# Patient Record
Sex: Male | Born: 1950 | Race: White | Hispanic: No | Marital: Married | Smoking: Former smoker
Health system: Southern US, Community
[De-identification: ages and names within clinical notes are randomized; demographics above are authoritative.]

## PROBLEM LIST (undated history)

## (undated) DIAGNOSIS — E7800 Pure hypercholesterolemia, unspecified: Secondary | ICD-10-CM

## (undated) DIAGNOSIS — E039 Hypothyroidism, unspecified: Secondary | ICD-10-CM

## (undated) DIAGNOSIS — M1990 Unspecified osteoarthritis, unspecified site: Secondary | ICD-10-CM

## (undated) DIAGNOSIS — F32A Depression, unspecified: Secondary | ICD-10-CM

## (undated) DIAGNOSIS — IMO0002 Reserved for concepts with insufficient information to code with codable children: Secondary | ICD-10-CM

## (undated) DIAGNOSIS — I10 Essential (primary) hypertension: Secondary | ICD-10-CM

## (undated) DIAGNOSIS — F329 Major depressive disorder, single episode, unspecified: Secondary | ICD-10-CM

## (undated) DIAGNOSIS — L0291 Cutaneous abscess, unspecified: Secondary | ICD-10-CM

## (undated) DIAGNOSIS — I639 Cerebral infarction, unspecified: Secondary | ICD-10-CM

## (undated) DIAGNOSIS — Z86010 Personal history of colonic polyps: Secondary | ICD-10-CM

## (undated) HISTORY — PX: COLONOSCOPY: SHX174

## (undated) HISTORY — DX: Personal history of colonic polyps: Z86.010

## (undated) HISTORY — PX: APPENDECTOMY: SHX54

## (undated) HISTORY — PX: OTHER SURGICAL HISTORY: SHX169

## (undated) HISTORY — DX: Hypothyroidism, unspecified: E03.9

---

## 1997-12-18 ENCOUNTER — Ambulatory Visit (HOSPITAL_COMMUNITY): Admission: RE | Admit: 1997-12-18 | Discharge: 1997-12-18 | Payer: Self-pay | Admitting: Family Medicine

## 2002-12-10 ENCOUNTER — Encounter: Payer: Self-pay | Admitting: Family Medicine

## 2002-12-10 ENCOUNTER — Ambulatory Visit (HOSPITAL_COMMUNITY): Admission: RE | Admit: 2002-12-10 | Discharge: 2002-12-10 | Payer: Self-pay | Admitting: Family Medicine

## 2003-08-26 ENCOUNTER — Inpatient Hospital Stay (HOSPITAL_COMMUNITY): Admission: EM | Admit: 2003-08-26 | Discharge: 2003-08-28 | Payer: Self-pay | Admitting: Emergency Medicine

## 2003-08-31 ENCOUNTER — Encounter: Admission: RE | Admit: 2003-08-31 | Discharge: 2003-11-29 | Payer: Self-pay | Admitting: Neurology

## 2004-08-18 ENCOUNTER — Ambulatory Visit: Payer: Self-pay | Admitting: Internal Medicine

## 2004-09-20 ENCOUNTER — Ambulatory Visit: Payer: Self-pay | Admitting: Internal Medicine

## 2004-10-05 ENCOUNTER — Ambulatory Visit: Payer: Self-pay | Admitting: Internal Medicine

## 2012-10-06 ENCOUNTER — Encounter (HOSPITAL_COMMUNITY): Payer: Self-pay | Admitting: *Deleted

## 2012-10-06 ENCOUNTER — Inpatient Hospital Stay (HOSPITAL_COMMUNITY)
Admission: EM | Admit: 2012-10-06 | Discharge: 2012-10-09 | DRG: 603 | Disposition: A | Discharge status: Home / Self Care | Payer: Non-veteran care | Attending: Internal Medicine | Admitting: Internal Medicine

## 2012-10-06 DIAGNOSIS — F3289 Other specified depressive episodes: Secondary | ICD-10-CM | POA: Diagnosis present

## 2012-10-06 DIAGNOSIS — Z8673 Personal history of transient ischemic attack (TIA), and cerebral infarction without residual deficits: Secondary | ICD-10-CM

## 2012-10-06 DIAGNOSIS — M7040 Prepatellar bursitis, unspecified knee: Secondary | ICD-10-CM | POA: Diagnosis present

## 2012-10-06 DIAGNOSIS — L02415 Cutaneous abscess of right lower limb: Secondary | ICD-10-CM

## 2012-10-06 DIAGNOSIS — L02419 Cutaneous abscess of limb, unspecified: Principal | ICD-10-CM | POA: Diagnosis present

## 2012-10-06 DIAGNOSIS — Z87891 Personal history of nicotine dependence: Secondary | ICD-10-CM

## 2012-10-06 DIAGNOSIS — E785 Hyperlipidemia, unspecified: Secondary | ICD-10-CM

## 2012-10-06 DIAGNOSIS — L03119 Cellulitis of unspecified part of limb: Secondary | ICD-10-CM | POA: Diagnosis present

## 2012-10-06 DIAGNOSIS — F329 Major depressive disorder, single episode, unspecified: Secondary | ICD-10-CM | POA: Diagnosis present

## 2012-10-06 DIAGNOSIS — L03115 Cellulitis of right lower limb: Secondary | ICD-10-CM

## 2012-10-06 DIAGNOSIS — IMO0002 Reserved for concepts with insufficient information to code with codable children: Secondary | ICD-10-CM | POA: Diagnosis present

## 2012-10-06 DIAGNOSIS — I10 Essential (primary) hypertension: Secondary | ICD-10-CM

## 2012-10-06 DIAGNOSIS — A4902 Methicillin resistant Staphylococcus aureus infection, unspecified site: Secondary | ICD-10-CM | POA: Diagnosis present

## 2012-10-06 DIAGNOSIS — E079 Disorder of thyroid, unspecified: Secondary | ICD-10-CM

## 2012-10-06 DIAGNOSIS — E7800 Pure hypercholesterolemia, unspecified: Secondary | ICD-10-CM | POA: Diagnosis present

## 2012-10-06 HISTORY — DX: Unspecified osteoarthritis, unspecified site: M19.90

## 2012-10-06 HISTORY — DX: Cutaneous abscess, unspecified: L02.91

## 2012-10-06 HISTORY — DX: Cerebral infarction, unspecified: I63.9

## 2012-10-06 HISTORY — DX: Essential (primary) hypertension: I10

## 2012-10-06 HISTORY — DX: Pure hypercholesterolemia, unspecified: E78.00

## 2012-10-06 HISTORY — DX: Depression, unspecified: F32.A

## 2012-10-06 HISTORY — DX: Major depressive disorder, single episode, unspecified: F32.9

## 2012-10-06 HISTORY — DX: Reserved for concepts with insufficient information to code with codable children: IMO0002

## 2012-10-06 LAB — CBC
Hemoglobin: 13.8 g/dL (ref 13.0–17.0)
MCH: 31.5 pg (ref 26.0–34.0)
MCHC: 34.7 g/dL (ref 30.0–36.0)
MCV: 90.9 fL (ref 78.0–100.0)
RBC: 4.38 MIL/uL (ref 4.22–5.81)
RDW: 12.8 % (ref 11.5–15.5)

## 2012-10-06 LAB — POCT I-STAT, CHEM 8
BUN: 15 mg/dL (ref 6–23)
Calcium, Ion: 1.18 mmol/L (ref 1.13–1.30)
Chloride: 103 mEq/L (ref 96–112)
Glucose, Bld: 102 mg/dL — ABNORMAL HIGH (ref 70–99)
Hemoglobin: 13.6 g/dL (ref 13.0–17.0)
Potassium: 4 mEq/L (ref 3.5–5.1)
TCO2: 28 mmol/L (ref 0–100)

## 2012-10-06 LAB — CBC WITH DIFFERENTIAL/PLATELET
Eosinophils Absolute: 0.4 10*3/uL (ref 0.0–0.7)
Lymphocytes Relative: 20 % (ref 12–46)
Lymphs Abs: 3.7 10*3/uL (ref 0.7–4.0)
MCHC: 35 g/dL (ref 30.0–36.0)
Monocytes Relative: 11 % (ref 3–12)
Platelets: 295 10*3/uL (ref 150–400)
RDW: 12.9 % (ref 11.5–15.5)

## 2012-10-06 LAB — HEMOGLOBIN A1C: Mean Plasma Glucose: 103 mg/dL (ref <117)

## 2012-10-06 MED ORDER — ENOXAPARIN SODIUM 40 MG/0.4ML ~~LOC~~ SOLN
40.0000 mg | SUBCUTANEOUS | Status: DC
  Administered 2012-10-06 – 2012-10-08 (×3): 40 mg via SUBCUTANEOUS
  Filled 2012-10-06 (×4): qty 0.4

## 2012-10-06 MED ORDER — LIDOCAINE-EPINEPHRINE (PF) 2 %-1:200000 IJ SOLN
10.0000 mL | Freq: Once | INTRAMUSCULAR | Status: DC
  Filled 2012-10-06: qty 10

## 2012-10-06 MED ORDER — VANCOMYCIN HCL IN DEXTROSE 1-5 GM/200ML-% IV SOLN
1000.0000 mg | Freq: Two times a day (BID) | INTRAVENOUS | Status: DC
  Administered 2012-10-06 – 2012-10-08 (×4): 1000 mg via INTRAVENOUS
  Filled 2012-10-06 (×5): qty 200

## 2012-10-06 MED ORDER — ONDANSETRON HCL 4 MG/2ML IJ SOLN: 4.0000 mg | Freq: Four times a day (QID) | INTRAMUSCULAR | Status: DC | PRN

## 2012-10-06 MED ORDER — SENNA 8.6 MG PO TABS
1.0000 | ORAL_TABLET | Freq: Two times a day (BID) | ORAL | Status: DC
  Administered 2012-10-06 – 2012-10-09 (×6): 8.6 mg via ORAL
  Filled 2012-10-06 (×8): qty 1

## 2012-10-06 MED ORDER — VANCOMYCIN HCL IN DEXTROSE 1-5 GM/200ML-% IV SOLN
1000.0000 mg | INTRAVENOUS | Status: DC
  Filled 2012-10-06: qty 200

## 2012-10-06 MED ORDER — CLINDAMYCIN PHOSPHATE 600 MG/50ML IV SOLN
600.0000 mg | Freq: Once | INTRAVENOUS | Status: AC
  Administered 2012-10-06: 600 mg via INTRAVENOUS
  Filled 2012-10-06: qty 50

## 2012-10-06 MED ORDER — MORPHINE SULFATE 4 MG/ML IJ SOLN
4.0000 mg | Freq: Once | INTRAMUSCULAR | Status: AC
  Administered 2012-10-06: 4 mg via INTRAVENOUS
  Filled 2012-10-06: qty 1

## 2012-10-06 MED ORDER — ASPIRIN EC 81 MG PO TBEC
81.0000 mg | DELAYED_RELEASE_TABLET | Freq: Every day | ORAL | Status: DC
  Administered 2012-10-06 – 2012-10-09 (×4): 81 mg via ORAL
  Filled 2012-10-06 (×4): qty 1

## 2012-10-06 MED ORDER — HYDROCODONE-ACETAMINOPHEN 5-325 MG PO TABS
1.0000 | ORAL_TABLET | ORAL | Status: DC | PRN
  Administered 2012-10-06 – 2012-10-09 (×12): 2 via ORAL
  Filled 2012-10-06 (×12): qty 2

## 2012-10-06 MED ORDER — LIDOCAINE-EPINEPHRINE 1 %-1:100000 IJ SOLN
10.0000 mL | Freq: Once | INTRAMUSCULAR | Status: AC
  Administered 2012-10-06: 10 mL via INTRADERMAL
  Filled 2012-10-06: qty 1

## 2012-10-06 MED ORDER — ONDANSETRON HCL 4 MG PO TABS: 4.0000 mg | ORAL_TABLET | Freq: Four times a day (QID) | ORAL | Status: DC | PRN

## 2012-10-06 NOTE — ED Provider Notes (Signed)
Medical screening examination/treatment/procedure(s) were conducted as a shared visit with non-physician practitioner(s) and myself.  I personally evaluated the patient during the encounter  4 days of redness, swelling, erythema to R knee with surrounding erythema.  ROM intact, no evidence of septic joint.  No fever or vomiting. No history of diabetes or tick bite.  Abscess with extensive cellulitis, would benefit from IV antibiotics.  Glynn Octave, MD 10/06/12 1710

## 2012-10-06 NOTE — ED Notes (Signed)
PA at bedside.

## 2012-10-06 NOTE — H&P (Signed)
Date: 10/06/2012               Patient Name:  Randy Shepherd MRN: [id number]  DOB: [birth year] Age / Sex: 62 y.o., male   PCP: No primary provider on file.              Medical Service: Internal Medicine Teaching Service              Attending Physician: Dr. Meredith Pel    First Contact: Kerrie Pleasure, MS 3 Pager: [phone]  Second Contact: Dr. Collier Bullock Pager: [phone]  Third Contact Dr. Dorise Hiss Pager: [phone]       After Hours (After 5p/  First Contact Pager: [phone]  weekends / holidays): Second Contact Pager: [phone]   Chief Complaint: knee pain  History of Present Illness: Randy Shepherd is a 62 yo with PMH of HTN, HLD, degenerative disk disease, and thyroid disease who presents with knee pain and swelling since Thursday. On Thursday he noticed a pimple and "whitehead" on his R knee that was non-pruritic but 6/10 on the pain scale and caused him to walk with a limp. On Friday, the pain worsened so he tried to pick the lesion and drain some of the pus. He also noticed increasing edema so that his knee was twice the size as normal. The pain continued to worsen and was exacerbated with standing and weight bearing. However, he does not feel like the pain is in his knee joint. On Saturday, he tried using a heating pad with minimal relief. He denied taking any pain medications. The pain worsened to 10/10 by Saturday night and he was unable to move his knee this morning so he decided to come to the ED for evaluation.   He reported that on Thursday he also noticed 3 small areas on his L knee that looked like bug bites. He assumed that the lesion on his R knee was also a bug bite. He denied any abrasion or penetrating trauma to his R leg, pre-existing skin infection, recent travel, or surgery to his knee.    He reported that 2 weeks ago he was infected with poison ivy after doing yard work. It affected primarily his R arm and groin but improved in a few days with the use of calamine lotion.  He  denies recent fevers, chills, shortness of breath, abdominal pain, diarrhea, nausea/vomiting, or changes in urination.    In the ED, he had I&D of his R knee and received one dose of clindamycin. On evaluation he reported improvement in his pain but still endorsed discomfort even at rest.   Meds: No current facility-administered medications for this encounter.   No current outpatient prescriptions on file.    Allergies: Allergies as of 10/06/2012  . (No Known Allergies)   Past Medical History  Diagnosis Date  . Hypertension   . Thyroid disease   . High cholesterol   . Depression   . 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 rate 16, SpO2 97.00%. BP 139/87  Pulse 76  Temp(Src) 97.5 F (36.4 C) (Oral)  Resp 16  SpO2 97%  General Appearance: Lying in bed, NAD, appears stated age Head: Normocephalic, atraumatic Eyes: PERRL, Conjunctiva/corneas clear, EOMI Nose: No drainage or sinus tenderness Throat: MMM, Dentures in place Neck: Supple, normal ROM, no thyromegaly or adenopathy appreciated Lungs: CTAB, normal WOB, no wheezes or crackles appreciated Heart: RRR, no murmurs appreciated, no JVD, no carotid bruits Abdomen: Soft, nontender, normal bowel sounds, no masses appreciated Extremities: 2+ PT and DP pulses b/l, no cyanosis, R knee with decreased ROM and flexion up to 45 degrees. Diffuse TTP  over R knee. Skin: No lesions, cracks, or increased dryness in R foot.  Warmth and diffuse erythema over R knee roughly extending to 12 cm above knee and down to mid calf. Hardened area with increased erythema in suprapatellar region measuring 4cm x 6cm with increased tenderness to palpation. Mild fluctuance on medial aspect of R knee. Packing visible from incision site. Borders of erythema marked.  3 well healing lesions 0.5 cm each on medial aspect of L thigh. Neurologic: CNII-XII intact. Normal strength in R knee with flexion, extension, abduction, adduction. Normal strength in R hip with flexion, extension, external rotation, internal rotation, abduction, adduction. Normal sensation in R knee. DTR deferred secondary to pain. Psychiatric: Normal mood and affect  Lab results: Basic Metabolic Panel:  Recent Labs  16/10/96 0834  NA 140  K 4.0  CL 103  GLUCOSE 102*  BUN 15  CREATININE 1.20   CBC:  Recent Labs  10/06/12 0810 10/06/12 0834  WBC 18.7*  --   NEUTROABS 12.5*  --   HGB 14.0 13.6  HCT 40.0 40.0  MCV 90.9  --   PLT 295  --     Assessment & Plan by Problem: Randy Shepherd is a 62 yo with PMH of HTN, HLD, degenerative disk disease, and thyroid disease who presents with knee pain and swelling since Thursday s/p I&D in the ED and currently in stable condition.  1. RLE pain - Etiology likely due to bug bite last week. Differential includes cellulitis (purulent vs. nonpurulent), cutaneous abscess, necrotizing fasciitis, and osteomyelitis. Osteomyelitis less likely as infection does not seem to involve knee joint. ROM with knee flexion likely decreased secondary to pain. Normal ROM otherwise. Will not pursue knee imaging at this time. Necrotizing fasciitis unlikely given onset of symptoms on Thursday. Most likely at this time is cutaneous abscess possibly leading to cellulitis given extensive borders of the lesion. Most likely caused by staph or strep species.     - Will  discontinue clindamycin as there is low suspicion for necrotizing fasciitis and gas gangrene. - Start vancomycin - Will check blood cultures given pt's WBC 18.7. (although doubt validity as pt already received 1 dose of clinda) - Pain control with morphine prn - Repeat CBC in the morning. - Reassess for receding borders in the morning.  2. Disposition - Dispo is deferred at this time, awaiting improvement of RLE infection. - Anticipated discharge in approximately 1-2 day(s). - The patient does not have a current PCP (No primary provider on file.) and does need an [facility] hospital follow-up appointment after discharge.   This is a Psychologist, occupational Note.  The care of the patient was discussed with Dr. Collier Bullock and Dr. Dorise Hiss and the assessment and plan was formulated with their assistance.  Please see their note for official documentation of the patient encounter.   Signed:  Kerrie Pleasure, Med Student 10/06/2012, 10:47 AM

## 2012-10-06 NOTE — ED Notes (Signed)
Reports possible bug bite to right knee, swelling and redness since Thursday.

## 2012-10-06 NOTE — ED Notes (Signed)
Right leg above the knee noted to have obvious erythema, edema, calor, and tenderness. Pt states he noticed a pustule initially (picked at it) site worsened. Erythema extending to upper thigh.

## 2012-10-06 NOTE — ED Provider Notes (Signed)
History     CSN: [id number]  Arrival date & time 10/06/12  0729   First MD Initiated Contact with Patient 10/06/12 [phone]      Chief Complaint  Patient presents with  . Abscess  . Knee Pain    (Consider location/radiation/quality/duration/timing/severity/associated sxs/prior treatment) HPI  62 year old male with history of degenerative disc disease, thyroid disease, and hypertension presents complaining of right knee pain and swelling.patient reports gradual onset of pain, redness, and swelling to the skin surface of her right knee ongoing for the past 4 days. Patient initially noticed a small bump to the anterior aspect of his right knee, which has been progressively worsening with redness and warmth.  He admits to cleaning out in the yard but does not recall any tick bite. Pain is described as an achy throbbing and burning sensation, 10 out of 10, persistent worsening with weightbearing. He does not think the pain is from the knee joint itself. He denies any recent trauma. He denies fever, nausea, vomiting, diarrhea, numbness or weakness. No history of diabetes. No specific treatment tried.  Past Medical History  Diagnosis Date  . Hypertension   . Thyroid disease   . High cholesterol   . Depression   . Degenerative disc disease     History reviewed. No pertinent past surgical history.  History reviewed. No pertinent family history.  History  Substance Use Topics  . Smoking status: Former Games developer  . Smokeless tobacco: Not on file  . Alcohol Use: Yes      Review of Systems  Constitutional: Negative for fever.  Musculoskeletal: Positive for myalgias.  Skin: Positive for rash. Negative for wound.  Neurological: Negative for numbness.    Allergies  Review of patient's allergies indicates no known allergies.  Home Medications  No current outpatient prescriptions on file.  BP 139/87  Pulse 93  Temp(Src) 97.5 F (36.4 C) (Oral)  Resp 18  SpO2 96%  Physical Exam   Nursing note and vitals reviewed. Constitutional: He is oriented to person, place, and time. He appears well-developed and well-nourished. No distress.  HENT:  Head: Atraumatic.  Eyes: Conjunctivae are normal.  Neck: Neck supple.  Neurological: He is alert and oriented to person, place, and time.  Having difficulty ambulating when bearing weight on R leg due to pain.  Skin: Skin is warm. Rash (R knee: a small pustular lesion to suprapatella region with marked surroudng erythema and warmth measuring 2x4inches and diffused erythema throughout surrounding skin.  decreased R knee flexion due to pain.  affected area is indurated with mild fluctuance) noted.  Psychiatric: He has a normal mood and affect.    ED Course  Procedures (including critical care time)  8:14 AM Pt with cellulitic and possible cutaneous abscess to R knee.  Doubt septic joint as it is cutaneous in nature.  Pt does have decreased R knee flexion likely due to increase pressure to cutaneous tissue.  He does not think the pain is in his knee joint.  Will check basic labs, give pain medication, IV clindamycin, and will also perform I&D.  Care discussed with attending.    INCISION AND DRAINAGE Performed by: Fayrene Helper Consent: Verbal consent obtained. Risks and benefits: risks, benefits and alternatives were discussed Type: abscess  Body area: R anterior knee (cutaneous)  Anesthesia: local infiltration  Incision was made with a scalpel.  Local anesthetic: lidocaine 2% w epinephrine  Anesthetic total: 6 ml  Complexity: complex Blunt dissection to break up loculations  Drainage: purulent  Drainage  amount: moderate  Packing material: 1/4 in iodoform gauze  Patient tolerance: Patient tolerated the procedure well with no immediate complications.  8:45 AM Successful I&D of abscess.  Will continue with pain medication and IV clindamycin.  Margin of cellulitis is marked and dated with skin marking pen.  Pt will  return in 48 hrs for wound recheck.    9:22 AM Pt has elevated WBC of 18.  He however felt better, no significant pain when not ambulating, is afebrile.  Due to elevated WBC and extensive cellulitic changes, we felt pt would benefit from IV abx and monitoring.  Pt agrees.  I have consulted Internal Medicine, who agrees to see pt in ER and will admit.     Labs Reviewed  CBC WITH DIFFERENTIAL - Abnormal; Notable for the following:    WBC 18.7 (*)    Neutro Abs 12.5 (*)    Monocytes Absolute 2.1 (*)    All other components within normal limits  POCT I-STAT, CHEM 8 - Abnormal; Notable for the following:    Glucose, Bld 102 (*)    All other components within normal limits   No results found.   1. Cellulitis of right leg without foot   2. Abscess of right leg excluding foot       MDM  BP 139/87  Pulse 78  Temp(Src) 97.5 F (36.4 C) (Oral)  Resp 15  SpO2 98%  I have reviewed nursing notes and vital signs.  I reviewed available ER/hospitalization records thought the EMR         Fayrene Helper, [place] 10/06/12 1610

## 2012-10-06 NOTE — H&P (Signed)
I have seen and evaluated the patient with Randy Shepherd, MS3. I have read her note and agree with her assessment and plan. Please see my H&P for my complete findings and plan.  Denton Ar, MD 1:49 PM

## 2012-10-06 NOTE — H&P (Signed)
Hospital Admission Note Date: 10/06/2012  Patient name: BRACK SHADDOCK Medical record number: [id number] Date of birth: 04/08/1951 Age: 62 y.o. Gender: male PCP: No primary provider on file.  _________________________________________________________ INTERNAL MEDICINE TEACHING SERVICE CONTACT INFO     Weekday Hours (7AM-5PM): ** If no return call within 15 minutes (after trying both pagers listed below), please call after hours pagers.    First Contact:  Dr. Collier Bullock   Pager:  [phone] Second Contact:   Dr. Dorise Hiss   Pager:  [phone]      After Hours (after 5PM)/ Weekend / Holidays: First Contact:              Pager: [phone] Second Contact:         Pager: [phone] _________________________________________________________   Chief Complaint: painful spot on leg  History of Present Illness:  Mr. Ritchey is a 62 year old gentleman with a history of hypertension, thyroid disease, hyperlipidemia, depression, who presents with worsening pain, swelling, redness over his right knee.  Patient states that 2 weeks ago, he got poison ivy all over his legs and arms. This resolves, however, he developed a painful lesion over his right knee last Thursday which has gotten worse since then. On Friday, he tried to pick the lesion, which looked like a large pimple at that time. He then began to have worsening pain, worsening edema, and worsening redness. He tried using heating pads at home, but the pain worsened until the day before admission where the pain was so bad he could not sleep and he decided to come to the ED. Patient states that his knee joint itself is not really painful, he just has intense pain over the lesion when his muscles tense up while walking. His range of motion is preserved. He has no other joints that are swollen or red. Denies any history of gout or septic arthritis. He denies any fever or chills. Denies high-risk sexual behavior, he is sexually active only with his wife, monogamous  relationship.  Denies any shortness of breath, chest pain, abdominal pain, nausea, vomiting, diarrhea.  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 Quit smoking 25 years ago but at one point was smoking 2 packs per day while in the [employer]. Denies any drug use, but drinks about 3 beers per day.  Meds:   Medication List     As of 10/06/2012 11:21 AM    Notice      You have not been prescribed any medications.         Allergies: Allergies as of 10/06/2012  . (No Known Allergies)   Past Medical History  Diagnosis Date  . Hypertension   . Thyroid disease   . High cholesterol   . Depression   . Degenerative disc disease    History reviewed. No pertinent past surgical history. History reviewed. No pertinent family history. History   Social History  . Marital Status: Married    Spouse Name: N/A    Number of Children: N/A  . Years of Education: N/A   Occupational History  . Not on file.   Social History Main Topics  . Smoking status: Former Games developer  . Smokeless tobacco: Not on file  . Alcohol Use: Yes  . Drug Use: No  . Sexually Active: Not on file   Other Topics Concern  . Not on file   Social History Narrative  . No narrative on file  Review of Systems: Pertinent items noted in HPI   Physical Exam Blood pressure 139/87, pulse 76, temperature 97.5 F (36.4 C), temperature source Oral, resp. rate 16, SpO2 97.00%. General:  No acute distress, alert and oriented x 3, well-appearing Caucasian male HEENT:  PERRL, EOMI, moist mucous membranes Cardiovascular:  Regular rate and rhythm Respiratory:  Clear to auscultation bilaterally, no wheezes, rales, or rhonchi Abdomen:  Soft, nondistended, nontender, bowel sounds present Extremities:  Right knee with large area of erythema, tenderness, warmth, and induration just above the anterior knee. S/p I and D over indurated area. Still  quite tender to palpation. Lines around erythema drawn. ROM to 30 degrees then limited due to pain Skin: Warm, dry. Neuro: Not anxious appearing, no depressed mood, normal affect  Lab results: Basic Metabolic Panel:  Recent Labs  42/59/56 0834  NA 140  K 4.0  CL 103  GLUCOSE 102*  BUN 15  CREATININE 1.20   CBC:  Recent Labs  10/06/12 0810 10/06/12 0834  WBC 18.7*  --   NEUTROABS 12.5*  --   HGB 14.0 13.6  HCT 40.0 40.0  MCV 90.9  --   PLT 295  --      Assessment & Plan by Problem: Principal Problem:   Cellulitis and abscess of leg Active Problems:   Hypertension   Hyperlipidemia   History of stroke   Thyroid dysfunction  Mr. Icenogle is a 62 year old gentleman with a history of hypertension, thyroid disease, hyperlipidemia, depression, who presents with worsening pain, swelling, redness over his right knee.  Cellulitis and Abscess of Right Thigh Now s/p I and D in the ED 6/8, no cultures sent. Given IV clindamycin in the ED Vitals stable, afebrile. White count 18.7 on admission. Less likely necrotizing fasciitis, but will observe for progression.  -admit to IMTS, Dr. Meredith Pel attending -will give IV Vancomycin, plan to switch to PO regimen tomorrow -will monitor for  -check blood cultures -pain control with vicodin PRN -repeat CBC tomorrow AM  Hypertension Patient does not remember his home medications -monitor BP and obtain home med list  DVT ppx -lovenox  FEN -regular diet -monitor electrolytes  Dispo -anticipate DC in 1-2 days      Signed: Denton Ar 10/06/2012, 11:21 AM

## 2012-10-07 DIAGNOSIS — L0291 Cutaneous abscess, unspecified: Secondary | ICD-10-CM

## 2012-10-07 DIAGNOSIS — E079 Disorder of thyroid, unspecified: Secondary | ICD-10-CM

## 2012-10-07 HISTORY — DX: Cutaneous abscess, unspecified: L02.91

## 2012-10-07 LAB — CBC
MCH: 31.1 pg (ref 26.0–34.0)
MCHC: 34.2 g/dL (ref 30.0–36.0)
Platelets: 276 10*3/uL (ref 150–400)
RBC: 4.25 MIL/uL (ref 4.22–5.81)
RDW: 12.8 % (ref 11.5–15.5)

## 2012-10-07 LAB — COMPREHENSIVE METABOLIC PANEL
AST: 21 U/L (ref 0–37)
Alkaline Phosphatase: 54 U/L (ref 39–117)
CO2: 25 mEq/L (ref 19–32)
Chloride: 101 mEq/L (ref 96–112)
Creatinine, Ser: 1.09 mg/dL (ref 0.50–1.35)
GFR calc non Af Amer: 71 mL/min — ABNORMAL LOW (ref >90)
Potassium: 4 mEq/L (ref 3.5–5.1)
Total Bilirubin: 0.3 mg/dL (ref 0.3–1.2)

## 2012-10-07 NOTE — H&P (Signed)
Internal Medicine Attending Admission Note Date: 10/07/2012  Patient name: Randy Shepherd Medical record number: [id number] Date of birth: 17-Sep-1950 Age: 62 y.o. Gender: male  I saw and evaluated the patient. I reviewed the resident's note and I agree with the resident's findings and plan as documented in the resident's note, with the following additional comments.  Chief Complaint(s): Skin erythema, pain, and swelling above right knee  History - key components related to admission: Patient is a 62 year old man with history of hypertension, hyperlipidemia, and other problems as outlined in the medical history admitted with pain, erythema, and swelling above the right knee.  He denies prior skin infections, although he did have poison ivy on his extremities about 2 weeks ago.  He denies fever, chills, or sweats.   Physical Exam - key components related to admission:  Filed Vitals:   10/06/12 1144 10/06/12 1245 10/06/12 2119 10/07/12 0614  BP: 132/72 130/84 119/69 135/86  Pulse: 79 81 87 73  Temp: 99.8 F (37.7 C) 98 F (36.7 C) 100.6 F (38.1 C) 98.3 F (36.8 C)  TempSrc: Oral Oral    Resp: 16  18 18   Height:  5\' 9"  (1.753 m)    Weight:  240 lb 8.4 oz (109.1 kg)    SpO2: 98% 100% 99% 100%   General: Alert, no distress Lungs: Clear Heart: Regular; S1-S2, no S3, no S4, no murmurs Abdomen: Bowel sounds present, soft, nontender Extremities: Is an area of induration and erythema superior to the right knee; there are 2 small incisions from I&D performed by ED staff in the emergency department with a small amount of drainage.  There is no apparent joint effusion.  Lab results:   Basic Metabolic Panel:  Recent Labs  13/24/40 0834 10/06/12 1407 10/07/12 0420  NA 140  --  135  K 4.0  --  4.0  CL 103  --  101  CO2  --   --  25  GLUCOSE 102*  --  122*  BUN 15  --  19  CREATININE 1.20 1.03 1.09  CALCIUM  --   --  9.3   Liver Function Tests:  Recent Labs  10/07/12 0420   AST 21  ALT 13  ALKPHOS 54  BILITOT 0.3  PROT 6.7  ALBUMIN 3.1*    CBC:  Recent Labs  10/06/12 0810  10/06/12 1407 10/07/12 0420  WBC 18.7*  --  20.4* 16.0*  NEUTROABS 12.5*  --   --   --   HGB 14.0  < > 13.8 13.2  HCT 40.0  < > 39.8 38.6*  MCV 90.9  --  90.9 90.8  PLT 295  --  269 276  < > = values in this interval not displayed.   Hemoglobin A1C:  Recent Labs  10/06/12 1407  HGBA1C 5.2    Imaging results:  No results found.  Assessment & Plan by Problem:  1.  Cellulitis and abscess of right anterior distal thigh.  Patient underwent incision and drainage by the emergency department staff.  He reports some improvement in the extent of erythema overnight.  Plan is culture drainage; continue empiric IV vancomycin; follow exam closely; await blood culture results; wound care  2.  Other problems and plans as per the resident physician's note.

## 2012-10-07 NOTE — Progress Notes (Signed)
Subjective: Mr. Raschke states the pain in his RLE has improved this morning. His last dose of PO pain medication was last night. He has been able to stand up with some discomfort but was able to walk to the restroom. Tmax of 100.6 last night. No other complaints.    Objective: Vital signs in last 24 hours: Filed Vitals:   10/06/12 1144 10/06/12 1245 10/06/12 2119 10/07/12 0614  BP: 132/72 130/84 119/69 135/86  Pulse: 79 81 87 73  Temp: 99.8 F (37.7 C) 98 F (36.7 C) 100.6 F (38.1 C) 98.3 F (36.8 C)  TempSrc: Oral Oral    Resp: 16  18 18   Height:  5\' 9"  (1.753 m)    Weight:  109.1 kg (240 lb 8.4 oz)    SpO2: 98% 100% 99% 100%    Intake/Output Summary (Last 24 hours) at 10/07/12 1050 Last data filed at 10/07/12 0615  Gross per 24 hour  Intake    360 ml  Output      0 ml  Net    360 ml   General Appearance: Lying in bed, NAD, appears stated age  Head: Normocephalic, atraumatic  Eyes: Conjunctiva/corneas clear, EOMI  Lungs: CTAB, normal WOB, no wheezes or crackles appreciated  Heart: RRR, no murmurs appreciated Abdomen: Soft, nontender, normal bowel sounds, no masses appreciated  Extremities: 2+ PT and DP pulses b/l, no cyanosis Skin: No lesions, cracks, or increased dryness in R foot.  Diffuse erythema improved from yesterday over R knee now extending to roughly 8 cm above knee 8 cm below knee. Indurated area with increased erythema in suprapatellar region measuring 4cm x 6cm with moderate tenderness to palpation. No visible drainage. Packing in place from incision site. Neurologic: Normal sensation in R knee. DTR deferred secondary to pain.  Psychiatric: Normal mood and affect  Lab Results: Basic Metabolic Panel:  Recent Labs Lab 10/06/12 0834 10/06/12 1407 10/07/12 0420  NA 140  --  135  K 4.0  --  4.0  CL 103  --  101  CO2  --   --  25  GLUCOSE 102*  --  122*  BUN 15  --  19  CREATININE 1.20 1.03 1.09  CALCIUM  --   --  9.3   Liver Function  Tests:  Recent Labs Lab 10/07/12 0420  AST 21  ALT 13  ALKPHOS 54  BILITOT 0.3  PROT 6.7  ALBUMIN 3.1*   CBC:  Recent Labs Lab 10/06/12 0810  10/06/12 1407 10/07/12 0420  WBC 18.7*  --  20.4* 16.0*  NEUTROABS 12.5*  --   --   --   HGB 14.0  < > 13.8 13.2  HCT 40.0  < > 39.8 38.6*  MCV 90.9  --  90.9 90.8  PLT 295  --  269 276  < > = values in this interval not displayed.  Hemoglobin A1C:  Recent Labs Lab 10/06/12 1407  HGBA1C 5.2   Medications: I have reviewed the patient's current medications. Scheduled Meds: . aspirin EC  81 mg Oral Daily  . enoxaparin (LOVENOX) injection  40 mg Subcutaneous Q24H  . senna  1 tablet Oral BID  . vancomycin  1,000 mg Intravenous Q12H   PRN Meds:.HYDROcodone-acetaminophen, ondansetron (ZOFRAN) IV, ondansetron   Assessment/Plan: Mr. Hoffmann is a 62 yo with PMH of HTN, HLD, degenerative disk disease, and thyroid disease who presents with knee pain and swelling since Thursday s/p I&D in the ED and currently in stable condition.  1. RLE pain - Etiology likely due to bug bite last week. Differential includes cellulitis (purulent vs. nonpurulent), cutaneous abscess, necrotizing fasciitis, and osteomyelitis. Osteomyelitis less likely as infection does not seem to involve knee joint. ROM with knee flexion likely decreased secondary to pain. Normal ROM otherwise. Will not pursue knee imaging at this time. Necrotizing fasciitis unlikely given onset of symptoms on Thursday. Most likely at this time is cutaneous abscess possibly leading to cellulitis given extensive borders of the lesion. Most likely caused by staph or strep species. Improvement in erythema this morning, WBC improved to 16.0, and pt in stable condition.  - Started IV vancomycin. Will continue for 1 more day and likely switch to PO abx tomorrow.  - Pain control with hydrocodone-acetaminophen prn - Will attempt to culture any drainage from the lesion for organism and  sensitivities  - Repeat CBC in the morning.  - Reassess for receding borders in the morning.   2. HTN - Pt is unable to provide home regimen. BPs over night have ranged from 119-139/69-87. - [facility] [place] [facility]  3. Disposition  - Dispo is deferred at this time, awaiting improvement of RLE infection.  - Anticipated discharge in approximately 1-2 day(s).  - The patient does not have a current PCP (No primary provider on file.) and does need an [facility] hospital follow-up appointment after discharge.    This is a Psychologist, occupational Note.  The care of the patient was discussed with Dr. Collier Bullock, Dr. Dorise Hiss, and Dr. Meredith Pel and the assessment and plan formulated with their assistance.  Please see their attached note for official documentation of the daily encounter.   LOS: 1 day   Kerrie Pleasure, Med Student 10/07/2012, 10:50 AM

## 2012-10-07 NOTE — Progress Notes (Signed)
UR COMPLETED  

## 2012-10-07 NOTE — Progress Notes (Signed)
Resident Co-sign Daily Note: I have seen the patient and reviewed the daily progress note by Randy Pleasure, MS 3, and discussed the care of the patient with them.  See below for documentation of my findings, assessment, and plans.  Subjective: Patient states that he is doing well this morning. His pain is much improved. He did require one dose of by mouth pain medication last night. He was able to stand up and go to the bathroom without any difficulties. He still denies any pain in his knee joint. He states that the redness has gone down significantly.  He denies feeling feverish, denies any rigors or chills, denies any other joint pains.  Objective: Vital signs in last 24 hours: Filed Vitals:   10/06/12 1245 10/06/12 2119 10/07/12 0614 10/07/12 1300  BP: 130/84 119/69 135/86 127/70  Pulse: 81 87 73   Temp: 98 F (36.7 C) 100.6 F (38.1 C) 98.3 F (36.8 C) 98.9 F (37.2 C)  TempSrc: Oral   Oral  Resp:  18 18 18   Height: 5\' 9"  (1.753 m)     Weight: 240 lb 8.4 oz (109.1 kg)     SpO2: 100% 99% 100% 97%    Physical Exam Blood pressure 127/70, pulse 73, temperature 98.9 F (37.2 C), temperature source Oral, resp. rate 18, height 5\' 9"  (1.753 m), weight 240 lb 8.4 oz (109.1 kg), SpO2 97.00%. General: No acute distress, alert and oriented x 3, well-appearing Caucasian male  HEENT: PERRL, EOMI, moist mucous membranes  Cardiovascular: Regular rate and rhythm  Respiratory: Clear to auscultation bilaterally, no wheezes, rales, or rhonchi  Abdomen: Soft, nondistended, nontender, bowel sounds present  Extremities: Right knee with erythema, tenderness, warmth, and induration just above the anterior knee. S/p I and D over indurated area. Still quite tender to palpation, erythema much improved and reduced from yesterday. ROM to 30 degrees then limited due to pain  Skin: Warm, dry.  Neuro: Not anxious appearing, no depressed mood, normal affect  Lab Results: Reviewed and documented in  Electronic Record Micro Results: Reviewed and documented in Electronic Record Studies/Results: Reviewed and documented in Electronic Record Medications: I have reviewed the patient's current medications. Scheduled Meds: . aspirin EC  81 mg Oral Daily  . enoxaparin (LOVENOX) injection  40 mg Subcutaneous Q24H  . senna  1 tablet Oral BID  . vancomycin  1,000 mg Intravenous Q12H   Continuous Infusions:  PRN Meds:.HYDROcodone-acetaminophen, ondansetron (ZOFRAN) IV, ondansetron Assessment/Plan: Principal Problem:   Cellulitis and abscess of leg Active Problems:   Hypertension   Hyperlipidemia   History of stroke   Thyroid dysfunction  Randy Shepherd is a 62 year old gentleman with a history of hypertension, thyroid disease, hyperlipidemia, depression, who presents with worsening pain, swelling, redness over his right knee.   Cellulitis and Abscess of Right Thigh  Now s/p I and D in the ED 6/8, no cultures sent. Given IV clindamycin in the ED  Vitals stable, afebrile. White count 18.7 on admission.  Erythema improved, still has a large area of induration and erythema around the drained area  -continue IV vancomycin -no cultures sent from initial I&D, however, I sent a swab for a gram stain and culture today, may or may not be helpful as likely contaminated with skin flora -plan to switch to PO atb tomorrow -pain control with PRN vicodin  Hypertension  Patient does not remember his home medications, though BP has been normal since admission -monitor BP and obtain home med list from [place] in [place], request  for medication list faxed over to the [place], no response yet  DVT ppx  -lovenox   FEN  -regular diet  -monitor electrolytes   Dispo  -anticipate DC tomorrow     LOS: 1 day   [place] Ar 10/07/2012, 3:21 PM

## 2012-10-08 ENCOUNTER — Encounter (HOSPITAL_COMMUNITY): Payer: Self-pay | Admitting: General Practice

## 2012-10-08 LAB — CBC
HCT: 39 % (ref 39.0–52.0)
Hemoglobin: 13.6 g/dL (ref 13.0–17.0)
MCH: 31.3 pg (ref 26.0–34.0)
MCV: 89.9 fL (ref 78.0–100.0)
RBC: 4.34 MIL/uL (ref 4.22–5.81)
WBC: 12.2 10*3/uL — ABNORMAL HIGH (ref 4.0–10.5)

## 2012-10-08 MED ORDER — SULFAMETHOXAZOLE-TMP DS 800-160 MG PO TABS
1.0000 | ORAL_TABLET | Freq: Two times a day (BID) | ORAL | Status: DC
  Administered 2012-10-08 – 2012-10-09 (×3): 1 via ORAL
  Filled 2012-10-08 (×4): qty 1

## 2012-10-08 MED ORDER — MUPIROCIN 2 % EX OINT
TOPICAL_OINTMENT | Freq: Every day | CUTANEOUS | Status: DC
  Administered 2012-10-08: 18:00:00 via NASAL
  Filled 2012-10-08: qty 22

## 2012-10-08 NOTE — Progress Notes (Signed)
NCM spoke to pt and [facility] aware of his admission to hospital. He had declined transfer to [place]. Has possible dc on 6/11 or 6/12. Isidoro Donning RN CCM Case Mgmt phone [phone]

## 2012-10-08 NOTE — Consult Note (Signed)
Reason for Consult:cellulitis and abscess right knee Referring Physician: hospitalist  Randy Shepherd is an 62 y.o. male.  HPI: Pt reports onset of pain around knee last Thursday when he noticed swelling and erythema around 3 lesions that he thought was poison oak.  He unroofed the areas and they progressively worsened until he presented to the ED on Sunday.  There local I &D was performed.  He received IV Clindamycin and has been changed to po Batrim today.  Cultures with staph aureus on preliminary review.  Edema and erythema as well as pain has improved significantly with local treatment.  He is now walking and no longer has fever or chills. WBC count trending downward. Consult requested for possible surgical intervention. Today after dressing change with packing removal and local superficial debridement of dermis, it does not appear he will need formal surgical I&D.  Past Medical History  Diagnosis Date  . Hypertension   . Thyroid disease   . High cholesterol   . Depression   . Degenerative disc disease   . Stroke   . DJD (degenerative joint disease)   . Abscess 10/07/2012    RT KNEE    Past Surgical History  Procedure Laterality Date  . Appendectomy      History reviewed. No pertinent family history.  Social History:  reports that he has quit smoking. He has never used smokeless tobacco. He reports that he drinks about 1.8 ounces of alcohol per week. He reports that he does not use illicit drugs.  Allergies: No Known Allergies  Medications: I have reviewed the patient's current medications.  Results for orders placed during the hospital encounter of 10/06/12 (from the past 48 hour(s))  COMPREHENSIVE METABOLIC PANEL     Status: Abnormal   Collection Time    10/07/12  4:20 AM      Result Value Range   Sodium 135  135 - 145 mEq/L   Potassium 4.0  3.5 - 5.1 mEq/L   Comment: HEMOLYSIS AT THIS LEVEL MAY AFFECT RESULT   Chloride 101  96 - 112 mEq/L   CO2 25  19 - 32 mEq/L    Glucose, Bld 122 (*) 70 - 99 mg/dL   BUN 19  6 - 23 mg/dL   Creatinine, Ser 1.61  0.50 - 1.35 mg/dL   Calcium 9.3  8.4 - 09.6 mg/dL   Total Protein 6.7  6.0 - 8.3 g/dL   Albumin 3.1 (*) 3.5 - 5.2 g/dL   AST 21  0 - 37 U/L   ALT 13  0 - 53 U/L   Alkaline Phosphatase 54  39 - 117 U/L   Total Bilirubin 0.3  0.3 - 1.2 mg/dL   GFR calc non Af Amer 71 (*) >90 mL/min   GFR calc Af Amer 83 (*) >90 mL/min   Comment:            The eGFR has been calculated     using the CKD EPI equation.     This calculation has not been     validated in all clinical     situations.     eGFR's persistently     <90 mL/min signify     possible Chronic Kidney Disease.  CBC     Status: Abnormal   Collection Time    10/07/12  4:20 AM      Result Value Range   WBC 16.0 (*) 4.0 - 10.5 K/uL   RBC 4.25  4.22 - 5.81 MIL/uL  Hemoglobin 13.2  13.0 - 17.0 g/dL   HCT 40.9 (*) 81.1 - 91.4 %   MCV 90.8  78.0 - 100.0 fL   MCH 31.1  26.0 - 34.0 pg   MCHC 34.2  30.0 - 36.0 g/dL   RDW 78.2  95.6 - 21.3 %   Platelets 276  150 - 400 K/uL  CULTURE, ROUTINE-ABSCESS     Status: None   Collection Time    10/07/12 11:35 AM      Result Value Range   Specimen Description ABSCESS RIGHT LEG     Special Requests NONE     Gram Stain       Value: RARE WBC PRESENT, PREDOMINANTLY PMN     NO SQUAMOUS EPITHELIAL CELLS SEEN     NO ORGANISMS SEEN   Culture       Value: MODERATE STAPHYLOCOCCUS AUREUS     Note: RIFAMPIN AND GENTAMICIN SHOULD NOT BE USED AS SINGLE DRUGS FOR TREATMENT OF STAPH INFECTIONS.   Report Status PENDING    CBC     Status: Abnormal   Collection Time    10/08/12 10:33 AM      Result Value Range   WBC 12.2 (*) 4.0 - 10.5 K/uL   RBC 4.34  4.22 - 5.81 MIL/uL   Hemoglobin 13.6  13.0 - 17.0 g/dL   HCT 08.6  57.8 - 46.9 %   MCV 89.9  78.0 - 100.0 fL   MCH 31.3  26.0 - 34.0 pg   MCHC 34.9  30.0 - 36.0 g/dL   RDW 62.9  52.8 - 41.3 %   Platelets 317  150 - 400 K/uL    No results found.  Review of Systems   Skin:       Painful area above right knee where previous I&D performed.  Reports erythema and swelling have improved.  Tolerating weight bearing.  All other systems reviewed and are negative.   Blood pressure 165/95, pulse 84, temperature 98.7 F (37.1 C), temperature source Oral, resp. rate 18, height 5\' 9"  (1.753 m), weight 109.1 kg (240 lb 8.4 oz), SpO2 99.00%. Physical Exam  Constitutional: He appears well-developed and well-nourished.  Musculoskeletal:       Right knee: He exhibits ecchymosis. Tenderness found.  Right knee:  2 small areas over prepatellar bursa area which have erythema and local edema.  Medial wound with opening and packing removed.  No purulence expressed from wound. Tissues pink with granulation and no bleeding.  Lateral wound flat with erythema and edema.  No drainage.  Dry dermis removed and no opening wound. Lateral wound below knee with dry dermis debrided.  Also erythema and edema without purulence or other drainage.  Area scribed around knee from initial encounter indicates significant decrease in the entire area of erythema.   ROM of knee minimally painful but limited secondary to the edema in the prepatellar area.  No effusion of knee.  Full extension of knee.  No edema of posterior knee in the popliteal fossa. Tender over areas of edema only     Assessment/Plan: Cellulitis and abscess of tissue surrounding the right knee.  Area most involved is the prepatellar bursa.  Pt responding to abx and local treatment. Recommendations:  Daily dressing changes with wicking of the medial wound until it has closed.  Local Bactroban ointment and light dressing.  Continue Bactrim DS unless final culture shows indication for change in abx.  Ice packs to knee to help with pain control and edema.  ROM  and weight bearing as tolerated of the right knee.  Would anticipate discharge in am if continues to improve.  Agree with follow up at [facility] or see Dr Ophelia Charter in office in one  week.  VERNON,SHEILA M 10/08/2012, 3:51 PM     Reviewed chart labs, images, examined pt. Agree with above.  This is pre-patellar bursa subQ  Likely staph infection.   PCR ordered to check for MRSA.  If positive then standard CHG cloth bath and mupiricin nasal tx as is standard for carriers.   I would be glad to follow up in office if needed. Office # [phone].  Will need PO ABX until it has completely healed.     Doristine Mango [phone]

## 2012-10-08 NOTE — Progress Notes (Signed)
Subjective: Randy Shepherd reports that his pain is controlled with Norco q4hr. He has been able to walk around his room with minimal discomfort. He states there was some drainage from the incision site last night but the redness has improved.    Objective: Vital signs in last 24 hours: Filed Vitals:   10/07/12 0614 10/07/12 1300 10/07/12 2157 10/08/12 0633  BP: 135/86 127/70 130/64 125/77  Pulse: 73  75 70  Temp: 98.3 F (36.8 C) 98.9 F (37.2 C) 97.9 F (36.6 C) 97.1 F (36.2 C)  TempSrc:  Oral    Resp: 18 18 18 18   Height:      Weight:      SpO2: 100% 97% 99% 99%    Intake/Output Summary (Last 24 hours) at 10/08/12 1034 Last data filed at 10/08/12 0650  Gross per 24 hour  Intake    360 ml  Output      0 ml  Net    360 ml    General Appearance: Lying in bed, NAD, appears stated age  Head: Normocephalic, atraumatic  Eyes: Conjunctiva/corneas clear, EOMI  Lungs: CTAB, normal WOB, no wheezes or crackles appreciated  Heart: RRR, no murmurs appreciated  Abdomen: Soft, nontender, normal bowel sounds, no masses appreciated  Extremities: 2+ PT and DP pulses b/l, no cyanosis  Skin: No lesions, cracks, or increased dryness in R foot.  Erythema improved from yesterday and now isolated to indurated area in suprapatellar area. Indurated area with mild tenderness to palpation. Minimal serous drainage from incision sites. Packing in place. Neurologic: Normal sensation in R knee. DTR deferred secondary to pain.  Psychiatric: Normal mood and affect   Lab Results: Basic Metabolic Panel:  Recent Labs Lab 10/06/12 0834 10/06/12 1407 10/07/12 0420  NA 140  --  135  K 4.0  --  4.0  CL 103  --  101  CO2  --   --  25  GLUCOSE 102*  --  122*  BUN 15  --  19  CREATININE 1.20 1.03 1.09  CALCIUM  --   --  9.3   Liver Function Tests:  Recent Labs Lab 10/07/12 0420  AST 21  ALT 13  ALKPHOS 54  BILITOT 0.3  PROT 6.7  ALBUMIN 3.1*   CBC:  Recent Labs Lab 10/06/12 0810   10/06/12 1407 10/07/12 0420  WBC 18.7*  --  20.4* 16.0*  NEUTROABS 12.5*  --   --   --   HGB 14.0  < > 13.8 13.2  HCT 40.0  < > 39.8 38.6*  MCV 90.9  --  90.9 90.8  PLT 295  --  269 276  < > = values in this interval not displayed.  Hemoglobin A1C:  Recent Labs Lab 10/06/12 1407  HGBA1C 5.2   Micro Results: Recent Results (from the past 240 hour(s))  CULTURE, ROUTINE-ABSCESS     Status: None   Collection Time    10/07/12 11:35 AM      Result Value Range Status   Specimen Description ABSCESS RIGHT LEG   Final   Special Requests NONE   Final   Gram Stain     Final   Value: RARE WBC PRESENT, PREDOMINANTLY PMN     NO SQUAMOUS EPITHELIAL CELLS SEEN     NO ORGANISMS SEEN   Culture     Final   Value: MODERATE STAPHYLOCOCCUS AUREUS     Note: RIFAMPIN AND GENTAMICIN SHOULD NOT BE USED AS SINGLE DRUGS FOR TREATMENT OF STAPH INFECTIONS.  Report Status PENDING   Incomplete   Medications: I have reviewed the patient's current medications. Scheduled Meds: . aspirin EC  81 mg Oral Daily  . enoxaparin (LOVENOX) injection  40 mg Subcutaneous Q24H  . senna  1 tablet Oral BID  . vancomycin  1,000 mg Intravenous Q12H   PRN Meds:.HYDROcodone-acetaminophen, ondansetron (ZOFRAN) IV, ondansetron   Assessment/Plan: Mr. Winterton is a 62 yo with PMH of HTN, HLD, degenerative disk disease, and thyroid disease who presents with knee pain and swelling since Thursday s/p I&D in the ED and currently in stable condition.   1. RLE pain - Etiology likely due to bug bite last week. Differential includes cellulitis (purulent vs. nonpurulent), cutaneous abscess, necrotizing fasciitis, and osteomyelitis. Osteomyelitis less likely as infection does not seem to involve knee joint. ROM with knee flexion likely decreased secondary to pain. Normal ROM otherwise. Will not pursue knee imaging at this time. Necrotizing fasciitis unlikely given onset of symptoms on Thursday. Most likely at this time is cutaneous  abscess possibly leading to cellulitis given extensive borders of the lesion. Most likely caused by staph or strep species. Improvement in erythema this morning, WBC improved to 16.0, and pt in stable condition.  - On IV vancomycin. Will consider discontinuing today and transition to Bactrim for 7-14 day course. - Pain control with hydrocodone-acetaminophen prn  - Abscess culture with rare WBCs, predominantly PMNs but no organisms to date. - Repeat CBC this morning.  - Surgery to evaluate today   2. HTN - Pt is unable to provide home regimen. BPs over night have ranged from 125-130/64-77.  [facility] [place] [facility] of information form to obtain pt's home medication list. No response yet. Will attempt to contact again today.   3. Disposition  - Dispo is deferred at this time, awaiting improvement of RLE infection.  - Anticipated discharge in approximately 1-2 day(s).  - The patient does not have a current PCP (No primary provider on file.) and does need an [facility] hospital follow-up appointment after discharge.    This is a Psychologist, occupational Note.  The care of the patient was discussed with Dr. Collier Bullock, Dr. Dorise Hiss, and Dr. Meredith Pel and the assessment and plan formulated with their assistance.  Please see their attached note for official documentation of the daily encounter.   LOS: 2 days   Kerrie Pleasure, Med Student 10/08/2012, 10:34 AM

## 2012-10-08 NOTE — Progress Notes (Signed)
Internal Medicine Attending  Date: 10/08/2012  Patient name: Randy Shepherd Medical record number: [id number] Date of birth: 1950-12-02 Age: 62 y.o. Gender: male  I saw and evaluated the patient on AM rounds with house staff. I reviewed the resident's note by Dr. Collier Bullock and I agree with the resident's findings and plans as documented in her note.

## 2012-10-08 NOTE — Progress Notes (Signed)
Resident Co-sign Daily Note: I have seen the patient and reviewed the daily progress note by Kerrie Pleasure, MS 3, and discussed the care of the patient with them.  See below for documentation of my findings, assessment, and plans.  Subjective:  Patient states that he is doing well this morning. Redness is much improved and his pain is better. He did have some drainage from the I&D site last night and shows a picture.  He has been walking up and down the halls. Denies any fever, chills, joint pains.   Objective: Vital signs in last 24 hours: Filed Vitals:   10/07/12 0614 10/07/12 1300 10/07/12 2157 10/08/12 0633  BP: 135/86 127/70 130/64 125/77  Pulse: 73  75 70  Temp: 98.3 F (36.8 C) 98.9 F (37.2 C) 97.9 F (36.6 C) 97.1 F (36.2 C)  TempSrc:  Oral    Resp: 18 18 18 18   Height:      Weight:      SpO2: 100% 97% 99% 99%   Physical Exam: General: No acute distress, alert and oriented x 3, well-appearing Caucasian male  HEENT: PERRL, EOMI, moist mucous membranes  Cardiovascular: Regular rate and rhythm  Respiratory: Clear to auscultation bilaterally, no wheezes, rales, or rhonchi  Abdomen: Soft, nondistended, nontender, bowel sounds present  Extremities: I&D site draining purulent material, the other site looks closed with an area of fluctuance it is not draining. He continues to have erythema, tenderness, warmth, and induration just above the anterior knee, smaller and more isolated area than yesterday. Area of induration extends beyond erythema. Still quite tender to palpation. ROM to 30 degrees then limited due to pain. Skin: Warm, dry.  Neuro: Not anxious appearing, no depressed mood, normal affect  Lab Results: Reviewed and documented in Electronic Record Micro Results: Reviewed and documented in Electronic Record Studies/Results: Reviewed and documented in Electronic Record Medications: I have reviewed the patient's current medications. Scheduled Meds: . aspirin EC   81 mg Oral Daily  . enoxaparin (LOVENOX) injection  40 mg Subcutaneous Q24H  . senna  1 tablet Oral BID  . vancomycin  1,000 mg Intravenous Q12H   Continuous Infusions:  PRN Meds:.HYDROcodone-acetaminophen, ondansetron (ZOFRAN) IV, ondansetron Assessment/Plan: Principal Problem:   Cellulitis and abscess of leg Active Problems:   Hypertension   Hyperlipidemia   History of stroke   Thyroid dysfunction  Cellulitis and Abscess of Right Thigh  Now s/p I and D in two places in the ED 6/8, no cultures sent. Given IV clindamycin in the ED  Vitals stable, afebrile. White count 18.7 on admission.  6/10: Purulent drainage from the I&D site, small area of medial fluctuance. White count down to 12.2. Remains afebrile.  -will ask general surgery to come and evaluate if further I&D is needed -Will need dressing change today -Will switch to Bactrim DS PO -no cultures sent from initial I&D -pain control with PRN vicodin   Hypertension  Patient does not remember his home medications, though BP has been normal since admission  -monitor BP and obtain home med list from [place] in [place], request for medication list faxed over to the [place], no response yet, will not give med list over the phone  DVT ppx  -lovenox   FEN  -regular diet   Dispo  -Possible discharge this afternoon after surgery consult though we may need to keep him as an inpatient one more night [facility]    LOS: 2 days   [place] Ar 10/08/2012, 11:11  AM

## 2012-10-09 LAB — CULTURE, ROUTINE-ABSCESS

## 2012-10-09 MED ORDER — MUPIROCIN 2 % EX OINT: TOPICAL_OINTMENT | Freq: Every day | CUTANEOUS | Status: DC

## 2012-10-09 MED ORDER — SULFAMETHOXAZOLE-TMP DS 800-160 MG PO TABS: 1.0000 | ORAL_TABLET | Freq: Two times a day (BID) | ORAL | Status: DC

## 2012-10-09 MED ORDER — HYDROCODONE-ACETAMINOPHEN 5-325 MG PO TABS
1.0000 | ORAL_TABLET | ORAL | Status: DC | PRN
Start: 2012-10-09 — End: 2012-10-17

## 2012-10-09 NOTE — Discharge Summary (Signed)
Internal Medicine Teaching [facility] Discharge Note  Name: Randy Shepherd MRN: [id number] DOB: 1951/03/14 62 y.o.  Date of Admission: 10/06/2012  7:46 AM Date of Discharge: 10/09/2012 Attending Physician: Farley Ly, MD  Discharge Diagnosis: Principal Problem:   Cellulitis and abscess of leg Active Problems:   Hypertension   Hyperlipidemia   History of stroke   Thyroid dysfunction   Discharge Medications:   Medication List    STOP taking these medications       methocarbamol 750 MG tablet  Commonly known as:  ROBAXIN     traMADol 50 MG tablet  Commonly known as:  ULTRAM      TAKE these medications       amLODipine 10 MG tablet  Commonly known as:  NORVASC  Take 5 mg by mouth daily.     benazepril 40 MG tablet  Commonly known as:  LOTENSIN  Take 40 mg by mouth daily.     buPROPion 100 MG tablet  Commonly known as:  WELLBUTRIN  Take 100 mg by mouth 4 (four) times daily. Take 2 tabs  in the AM. And 2 tabs at noon     clopidogrel 75 MG tablet  Commonly known as:  PLAVIX  Take 75 mg by mouth daily.     diclofenac 75 MG EC tablet  Commonly known as:  VOLTAREN  Take 75 mg by mouth 2 (two) times daily.     divalproex 250 MG 24 hr tablet  Commonly known as:  DEPAKOTE ER  Take 250 mg by mouth 3 (three) times daily. Take 3 at bedtime every day per patient     gabapentin 300 MG capsule  Commonly known as:  NEURONTIN  Take 300 mg by mouth 4 (four) times daily. Take 4 times a day and 2 tabs  at bedtime. Total of 6 pills a day     HYDROcodone-acetaminophen 5-325 MG per tablet  Commonly known as:  NORCO/VICODIN  Take 1 tablet by mouth every 4 (four) hours as needed.     levothyroxine 125 MCG tablet  Commonly known as:  SYNTHROID, LEVOTHROID  Take 125 mcg by mouth daily before breakfast.     mupirocin ointment 2 %  Commonly known as:  BACTROBAN  Apply topically daily. Apply to affect area daily until healed     sildenafil 100 MG tablet   Commonly known as:  VIAGRA  Take 100 mg by mouth daily as needed for erectile dysfunction.     simvastatin 40 MG tablet  Commonly known as:  ZOCOR  Take 20 mg by mouth every evening.     sulfamethoxazole-trimethoprim 800-160 MG per tablet  Commonly known as:  BACTRIM DS  Take 1 tablet by mouth every 12 (twelve) hours. For two weeks     verapamil 180 MG CR tablet  Commonly known as:  CALAN-SR  Take 180 mg by mouth at bedtime.     Vitamin D3 2000 UNITS Tabs  Take 1 capsule by mouth daily.        Disposition and follow-up:   [name] was discharged from [facility] in Stable condition.  At the hospital follow up visit please address the following:  MRSA R leg abscess and cellulitis -at one-time [facility] f/u appointment, please assess compliance with bactrim and f/u wound care and healing -duration of therapy: 10 days or longer if deemed necessary at follow up  Regular medical care -patient follows at the [facility] in [facility]  Follow-up Appointments:  Follow-up Information   Follow up with Janalyn Harder, MD. (June 19 at 9:45 AM. Please arrive 15 minutes early. Clinic is located on the ground floor of the hospital.)    Contact information:   [address] [place] [address] [phone]      Discharge Orders   Future Appointments Provider Department Dept Phone   10/17/2012 9:45 AM Linward Headland, MD [facility] [phone]   Future Orders Complete By Expires     Diet - low sodium heart healthy  As directed     Increase activity slowly  As directed        Consultations:  orthopedic surgery  Procedures Performed:  No results found.    Admission HPI:  [name] is a 62 year old gentleman with a history of hypertension, thyroid disease, hyperlipidemia, depression, who presents with worsening pain, swelling, redness over his right knee.  Patient states that 2 weeks ago, he got poison ivy all over his  legs and arms. This resolves, however, he developed a painful lesion over his right knee last Thursday which has gotten worse since then. On Friday, he tried to pick the lesion, which looked like a large pimple at that time. He then began to have worsening pain, worsening edema, and worsening redness. He tried using heating pads at home, but the pain worsened until the day before admission where the pain was so bad he could not sleep and he decided to come to the ED. Patient states that his knee joint itself is not really painful, he just has intense pain over the lesion when his muscles tense up while walking. His range of motion is preserved. He has no other joints that are swollen or red. Denies any history of gout or septic arthritis. He denies any fever or chills. Denies high-risk sexual behavior, he is sexually active only with his wife, monogamous relationship.  Denies any shortness of breath, chest pain, abdominal pain, nausea, vomiting, diarrhea.  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 Quit smoking 25 years ago but at one point was smoking 2 packs per day while in the [employer]. Denies any drug use, but drinks about 3 beers per day.   Hospital Course by problem list: Principal Problem:   Cellulitis and abscess of leg Active Problems:   Hypertension   Hyperlipidemia   History of stroke   Thyroid dysfunction   Prepatellar Bursa MRSA Abscess and Cellulitis Patient presented with painful lesion over his right knee, found to have an abscess which was drained in the ED. White count on admission was 20 and patient had significant erythema and induration around abscess. He was admitted, started on IV vancomycin and monitored closely. He continued to improve and white count trended down to 12.2. Culture taken the day after admission grew MRSA sensitive to bactrim, doxycycline. Orthopedic surgery was consulted and  did not feel that the knee joint was involved, no further debridement needed. Patient was discharged on PO bactrim with a Rx for 14 days as well as pain control with vicodin. He will need to continue daily dressing changes and topical bacitracin ointment as well as ice packs as needed to help with pain. He will be seen in our clinic for a one-time follow up [facility].   Discharge Vitals:  BP 136/81  Pulse 68  Temp(Src) 97.6 F (  36.4 C) (Oral)  Resp 16  Ht 5\' 9"  (1.753 m)  Wt 240 lb 8.4 oz (109.1 kg)  BMI 35.5 kg/m2  SpO2 98%  Discharge Labs:  Results for orders placed during the hospital encounter of 10/06/12 (from the past 24 hour(s))  CBC     Status: Abnormal   Collection Time    10/08/12 10:33 AM      Result Value Range   WBC 12.2 (*) 4.0 - 10.5 K/uL   RBC 4.34  4.22 - 5.81 MIL/uL   Hemoglobin 13.6  13.0 - 17.0 g/dL   HCT 16.1  09.6 - 04.5 %   MCV 89.9  78.0 - 100.0 fL   MCH 31.3  26.0 - 34.0 pg   MCHC 34.9  30.0 - 36.0 g/dL   RDW 40.9  81.1 - 91.4 %   Platelets 317  150 - 400 K/uL  MRSA PCR SCREENING     Status: None   Collection Time    10/08/12  8:15 PM      Result Value Range   MRSA by PCR NEGATIVE  NEGATIVE    Signed: [place] Ar 10/09/2012, 9:37 AM   Time Spent on Discharge: 25 minutes Services Ordered on Discharge: none Equipment Ordered on Discharge: none

## 2012-10-09 NOTE — Progress Notes (Addendum)
Patient ID: Randy Shepherd, male   DOB: 1951-03-02, 62 y.o.   MRN: [id number] Final culture reports reviewed and MRSA noted.  Usually use Doxycycline as first line oral treatment, however sensitivity shows organism sensitive to batrim also.  Should be discharge with oral abx of choice per attending.  Follow up one week, continue wound care as outlined in consult note.  Pt seen and discussed with Dr Ophelia Charter.  Will sign off.  Call if questions.  Final DX:  Prepatellar bursa infection with MRSA.

## 2012-10-09 NOTE — Progress Notes (Signed)
Report from [facility] lab confirmed that the wound culture taken on 10/07/12 is MRSA positive. MD paged and Contact precautions put in place.

## 2012-10-09 NOTE — Progress Notes (Signed)
Resident Co-sign Daily Note: I have seen the patient and reviewed the daily progress note by Kerrie Pleasure, MS 3, and discussed the care of the patient with them. See below for documentation of my findings, assessment, and plans.  Subjective: Patient continues to do well. No complaints. Has been able to walk around without any difficulty. States he is ready to go home.  Objective: Vital signs in last 24 hours: Filed Vitals:   10/08/12 0633 10/08/12 1300 10/08/12 2053 10/09/12 0445  BP: 125/77 165/95 140/82 136/81  Pulse: 70 84 71 68  Temp: 97.1 F (36.2 C) 98.7 F (37.1 C) 98.8 F (37.1 C) 97.6 F (36.4 C)  TempSrc:      Resp: 18 18 16 16   Height:      Weight:      SpO2: 99% 99% 96% 98%   Physical Exam: General: No acute distress, alert and oriented x 3, well-appearing Caucasian male  HEENT: PERRL, EOMI, moist mucous membranes  Cardiovascular: Regular rate and rhythm  Respiratory: Clear to auscultation bilaterally, no wheezes, rales, or rhonchi  Abdomen: Soft, nondistended, nontender, bowel sounds present  Extremities: I&D sites with new dressing in place, surrounding erythema. No areas of fluctuance.  Skin: Warm, dry.  Neuro: Not anxious appearing, no depressed mood, normal affect   Lab Results: Reviewed and documented in Electronic Record Micro Results: Reviewed and documented in Electronic Record Studies/Results: Reviewed and documented in Electronic Record Medications: I have reviewed the patient's current medications. Scheduled Meds: . aspirin EC  81 mg Oral Daily  . enoxaparin (LOVENOX) injection  40 mg Subcutaneous Q24H  . mupirocin ointment   Nasal Daily  . senna  1 tablet Oral BID  . sulfamethoxazole-trimethoprim  1 tablet Oral Q12H   Continuous Infusions:  PRN Meds:.HYDROcodone-acetaminophen, ondansetron (ZOFRAN) IV, ondansetron Assessment/Plan: Principal Problem:   Cellulitis and abscess of leg Active Problems:   Hypertension   Hyperlipidemia  History of stroke   Thyroid dysfunction  Cellulitis and Abscess of Right Thigh  Now s/p I and D in two places in the ED 6/8, no cultures sent. Given IV clindamycin in the ED Wound culture sent after drainage 6/10: Purulent drainage from the I&D site, small area of medial fluctuance. White count down to 12.2. Remains afebrile.  6/11: MRSA from cultures, sens to TMP-SMX  -cultures from wound grew MRSA, cont bactrim for 10 days -appreciate orthopedic consult and recommendations -daily dressing changes, bacitracin, PRN ice to knee for pain -will give Rx for vicodin -patient to follow up in our clinic next wee but [facility]  Hypertension  Patient to restart home meds on DC  DVT ppx  -lovenox   FEN  -regular diet   Dispo  -stable and ready for DC -pt to f/u in our clinic as well as the [place]     LOS: 3 days   [place] Ar 10/09/2012, 9:27 AM

## 2012-10-09 NOTE — Progress Notes (Signed)
Subjective: Pt feels well this morning. Knee was evaluated by orthopedics yesterday and packing was changed with binding in place. Pt able to walk around with minimal discomfort. Feels ready to go home today. No other complaints.   Objective: Vital signs in last 24 hours: Filed Vitals:   10/08/12 0633 10/08/12 1300 10/08/12 2053 10/09/12 0445  BP: 125/77 165/95 140/82 136/81  Pulse: 70 84 71 68  Temp: 97.1 F (36.2 C) 98.7 F (37.1 C) 98.8 F (37.1 C) 97.6 F (36.4 C)  TempSrc:      Resp: 18 18 16 16   Height:      Weight:      SpO2: 99% 99% 96% 98%    General Appearance: Lying in bed, NAD, appears stated age  Head: Normocephalic, atraumatic  Eyes: Conjunctiva/corneas clear, EOMI  Lungs: CTAB, normal WOB, no wheezes or crackles appreciated  Heart: RRR, no murmurs appreciated  Abdomen: Soft, nontender, normal bowel sounds, no masses appreciated  Extremities: 2+ PT and DP pulses b/l, no cyanosis  Skin: No lesions, cracks, or increased dryness in R foot.  Improved erythema on indurated area in suprapatellar area. Mild tenderness to palpation around packing sites. Indurated area seems to be getting softer. Packing and binding around knee in place.   Neurologic: Normal sensation in R knee. DTR deferred secondary to pain.  Psychiatric: Normal mood and affect   Lab Results: Basic Metabolic Panel:  Recent Labs Lab 10/06/12 0834 10/06/12 1407 10/07/12 0420  NA 140  --  135  K 4.0  --  4.0  CL 103  --  101  CO2  --   --  25  GLUCOSE 102*  --  122*  BUN 15  --  19  CREATININE 1.20 1.03 1.09  CALCIUM  --   --  9.3   Liver Function Tests:  Recent Labs Lab 10/07/12 0420  AST 21  ALT 13  ALKPHOS 54  BILITOT 0.3  PROT 6.7  ALBUMIN 3.1*   CBC:  Recent Labs Lab 10/06/12 0810  10/07/12 0420 10/08/12 1033  WBC 18.7*  < > 16.0* 12.2*  NEUTROABS 12.5*  --   --   --   HGB 14.0  < > 13.2 13.6  HCT 40.0  < > 38.6* 39.0  MCV 90.9  < > 90.8 89.9  PLT 295  < > 276 317   < > = values in this interval not displayed.  Hemoglobin A1C:  Recent Labs Lab 10/06/12 1407  HGBA1C 5.2    Micro Results: Recent Results (from the past 240 hour(s))  CULTURE, ROUTINE-ABSCESS     Status: None   Collection Time    10/07/12 11:35 AM      Result Value Range Status   Specimen Description ABSCESS RIGHT LEG   Final   Special Requests NONE   Final   Gram Stain     Final   Value: RARE WBC PRESENT, PREDOMINANTLY PMN     NO SQUAMOUS EPITHELIAL CELLS SEEN     NO ORGANISMS SEEN   Culture     Final   Value: MODERATE METHICILLIN RESISTANT STAPHYLOCOCCUS AUREUS     Note: RIFAMPIN AND GENTAMICIN SHOULD NOT BE USED AS SINGLE DRUGS FOR TREATMENT OF STAPH INFECTIONS. This organism DOES NOT demonstrate inducible Clindamycin resistance in vitro. CRITICAL RESULT CALLED TO, READ BACK BY AND VERIFIED WITH: HEATHER TREEPS      10/09/12 AT 800 AM BY [facility]   Report Status 10/09/2012 FINAL   Final   Organism  ID, Bacteria METHICILLIN RESISTANT STAPHYLOCOCCUS AUREUS   Final  MRSA PCR SCREENING     Status: None   Collection Time    10/08/12  8:15 PM      Result Value Range Status   MRSA by PCR NEGATIVE  NEGATIVE Final   Comment:            The GeneXpert MRSA Assay (FDA     approved for NASAL specimens     only), is one component of a     comprehensive MRSA colonization     surveillance program. It is not     intended to diagnose MRSA     infection nor to guide or     monitor treatment for     MRSA infections.   Medications: I have reviewed the patient's current medications. Scheduled Meds: . aspirin EC  81 mg Oral Daily  . enoxaparin (LOVENOX) injection  40 mg Subcutaneous Q24H  . mupirocin ointment   Nasal Daily  . senna  1 tablet Oral BID  . sulfamethoxazole-trimethoprim  1 tablet Oral Q12H   PRN Meds:.HYDROcodone-acetaminophen, ondansetron (ZOFRAN) IV, ondansetron   Assessment/Plan: Mr. Yuan is a 62 yo with PMH of HTN, HLD, degenerative disk disease, and thyroid  disease who presents with prepatellar bursa infection with MRSA s/p I&D in the ED and currently in stable condition.   1. Prepatellar bursa infection with MRSA - Etiology likely due to bug bite last week. ROM with knee flexion likely decreased secondary to pain. Normal ROM otherwise. Will not pursue knee imaging at this time. Continued improvement in erythema this morning, WBC trending down 18.7 --> 16.0 --> 12.2, and pt in stable condition. Culture growing MRSA today.  - Bactrim (10/08/12 -   ) He will require a 7-14 day course. - local Bactroban ointment - Pain control with hydrocodone-acetaminophen prn  - Orthopedics evaluated yesterday and feels there is no need for surgical tx. Recommends daily dressing changes with wicking of medial wound until closure.   2. HTN - Home medication list obtained this morning and entered into EPIC. BP max of 165/95 overnight. - Will start medications as appropriate  3. Disposition  - Dispo - awaiting improvement of RLE infection.  - Anticipated discharge likely today.  - The patient does not have a current PCP (No primary provider on file.) and does need an [facility] hospital follow-up appointment after discharge.    This is a Psychologist, occupational Note.  The care of the patient was discussed with Dr. Collier Bullock and Dr. Meredith Pel and the assessment and plan formulated with their assistance.  Please see their attached note for official documentation of the daily encounter.   LOS: 3 days   Kerrie Pleasure, Med Student 10/09/2012, 9:06 AM

## 2012-10-17 ENCOUNTER — Encounter: Payer: Self-pay | Admitting: Internal Medicine

## 2012-10-17 ENCOUNTER — Ambulatory Visit (INDEPENDENT_AMBULATORY_CARE_PROVIDER_SITE_OTHER): Payer: Non-veteran care | Admitting: Internal Medicine

## 2012-10-17 VITALS — BP 126/84 | HR 88 | Temp 98.3°F | Resp 16 | Ht 67.5 in | Wt 238.3 lb

## 2012-10-17 DIAGNOSIS — L02419 Cutaneous abscess of limb, unspecified: Secondary | ICD-10-CM

## 2012-10-17 DIAGNOSIS — L03119 Cellulitis of unspecified part of limb: Secondary | ICD-10-CM

## 2012-10-17 MED ORDER — HYDROCODONE-ACETAMINOPHEN 5-325 MG PO TABS: 1.0000 | ORAL_TABLET | ORAL | Status: DC | PRN

## 2012-10-17 NOTE — Patient Instructions (Signed)
General Instructions: Your infection continues to improve.  Continue your Bactrim medication until the bottle is empty.  We would like for someone to look at your knee about 1 week after you finish the antibiotic, to ensure that it has not worsened.  If [facility] are [facility], please call our clinic, and you can see [place] once more for this issue.   Treatment Goals:  Goals (1 Years of Data) as of 10/17/12   None      Progress Toward Treatment Goals:  Treatment Goal 10/17/2012  Blood pressure at goal    Self Care Goals & Plans:  Self Care Goal 10/17/2012  Manage my medications take my medicines as prescribed; bring my medications to every visit; refill my medications on time  Monitor my health keep track of my blood pressure  Eat healthy foods eat more vegetables; drink diet soda or water instead of juice or soda  Be physically active take a walk every day       Care Management & Community Referrals:  Referral 10/17/2012  Referrals made for care management support none needed

## 2012-10-17 NOTE — Progress Notes (Signed)
HPI The patient is a 62 y.o. male with a history of HTN, HL, prior CVA, thyroid dysfunction, presenting for a 1-time hospital follow-up.  The patient typically sees the [place] for his primary care.  Since he was recently hospitalized, he is following up with [place] for a 1-time visit, but after today will resume seeing the [place] for his primary care.  The patient was recently hospitalized for cellulitis of the right leg, near the knee, 6/8-6/11.  Two fluctuant areas were incised and drained, and cultures showed MRSA, sensitive to Bactrim.  The patient was discharged on a 2-week course of bactrim.  He has 1 week remaining in his prescription.  He notes that the pain and swelling have improved.  He notes no drainage, or fevers.  The patient asks for a refill on his pain medication.  ROS: General: no fevers, chills, changes in weight, changes in appetite Skin: no rash HEENT: no blurry vision, hearing changes, sore throat Pulm: no dyspnea, coughing, wheezing CV: no chest pain, palpitations, shortness of breath Abd: no abdominal pain, nausea/vomiting, diarrhea/constipation GU: no dysuria, hematuria, polyuria Ext: see HPI Neuro: no weakness, numbness, or tingling  Filed Vitals:   10/17/12 0952  BP: 126/84  Pulse: 88  Temp: 98.3 F (36.8 C)  Resp: 16    PEX General: alert, cooperative, and in no apparent distress HEENT: pupils equal round and reactive to light, vision grossly intact, oropharynx clear and non-erythematous  Neck: supple, no lymphadenopathy Lungs: clear to ascultation bilaterally, normal work of respiration, no wheezes, rales, ronchi Heart: regular rate and rhythm, no murmurs, gallops, or rubs Abdomen: soft, non-tender, non-distended, normal bowel sounds Extremities: Right knee with 3 distinct areas of erythema around her right knee, with firm inflammation but no area of fluctuance Neurologic: alert & oriented X3, cranial nerves II-XII intact, strength grossly intact, sensation intact  to light touch  Current Outpatient Prescriptions on File Prior to Visit  Medication Sig Dispense Refill  . amLODipine (NORVASC) 10 MG tablet Take 5 mg by mouth daily.      . benazepril (LOTENSIN) 40 MG tablet Take 40 mg by mouth daily.      Marland Kitchen buPROPion (WELLBUTRIN) 100 MG tablet Take 100 mg by mouth 4 (four) times daily. Take 2 tabs  in the AM. And 2 tabs at noon      . Cholecalciferol (VITAMIN D3) 2000 UNITS TABS Take 1 capsule by mouth daily.      . clopidogrel (PLAVIX) 75 MG tablet Take 75 mg by mouth daily.      . diclofenac (VOLTAREN) 75 MG EC tablet Take 75 mg by mouth 2 (two) times daily.      . divalproex (DEPAKOTE ER) 250 MG 24 hr tablet Take 250 mg by mouth 3 (three) times daily. Take 3 at bedtime every day per patient      . gabapentin (NEURONTIN) 300 MG capsule Take 300 mg by mouth 4 (four) times daily. Take 4 times a day and 2 tabs  at bedtime. Total of 6 pills a day      . HYDROcodone-acetaminophen (NORCO/VICODIN) 5-325 MG per tablet Take 1 tablet by mouth every 4 (four) hours as needed.  30 tablet  0  . levothyroxine (SYNTHROID, LEVOTHROID) 125 MCG tablet Take 125 mcg by mouth daily before breakfast.      . mupirocin ointment (BACTROBAN) 2 % Apply topically daily. Apply to affect area daily until healed  15 g  0  . sildenafil (VIAGRA) 100 MG tablet Take 100  mg by mouth daily as needed for erectile dysfunction.      . simvastatin (ZOCOR) 40 MG tablet Take 20 mg by mouth every evening.      . sulfamethoxazole-trimethoprim (BACTRIM DS) 800-160 MG per tablet Take 1 tablet by mouth every 12 (twelve) hours. For two weeks  30 tablet  0  . verapamil (CALAN-SR) 180 MG CR tablet Take 180 mg by mouth at bedtime.       No current facility-administered medications on file prior to visit.    Assessment/Plan

## 2012-10-17 NOTE — Assessment & Plan Note (Signed)
The patient was recently hospitalized for cellulitis of the right knee.  The area has continued to improve, and has no further evidence of fluctuance, drainage, or fever.  The patient has 1 week left on his bactrim prescription. -continue bactrim to complete the full 14-day course -refilled norco, 1-time only -we recommended to the patient that he be re-seen about 1 week after completing antibiotics, either at the [place] or with [place].  He prefers to attempt to obtain an appointment at the [place] first, but agrees to contact us for another 1-time follow-up if he is unable to do so

## 2012-10-17 NOTE — Progress Notes (Signed)
I saw patient and discussed with Dr. Manson Passey at the time of the visit.  We reviewed the resident's history and exam and pertinent patient test results.  I agree with the assessment, diagnosis, and plan of care documented in the resident's note.

## 2013-07-31 ENCOUNTER — Encounter (HOSPITAL_COMMUNITY): Payer: Self-pay | Admitting: Emergency Medicine

## 2013-07-31 ENCOUNTER — Emergency Department (HOSPITAL_COMMUNITY): Payer: No Typology Code available for payment source

## 2013-07-31 ENCOUNTER — Emergency Department (HOSPITAL_COMMUNITY)
Admission: EM | Admit: 2013-07-31 | Discharge: 2013-07-31 | Disposition: A | Discharge status: Home / Self Care | Payer: No Typology Code available for payment source | Attending: Emergency Medicine | Admitting: Emergency Medicine

## 2013-07-31 DIAGNOSIS — M1990 Unspecified osteoarthritis, unspecified site: Secondary | ICD-10-CM | POA: Insufficient documentation

## 2013-07-31 DIAGNOSIS — Y9389 Activity, other specified: Secondary | ICD-10-CM | POA: Insufficient documentation

## 2013-07-31 DIAGNOSIS — F3289 Other specified depressive episodes: Secondary | ICD-10-CM | POA: Insufficient documentation

## 2013-07-31 DIAGNOSIS — M549 Dorsalgia, unspecified: Secondary | ICD-10-CM

## 2013-07-31 DIAGNOSIS — I10 Essential (primary) hypertension: Secondary | ICD-10-CM | POA: Insufficient documentation

## 2013-07-31 DIAGNOSIS — E7800 Pure hypercholesterolemia, unspecified: Secondary | ICD-10-CM | POA: Insufficient documentation

## 2013-07-31 DIAGNOSIS — Z7902 Long term (current) use of antithrombotics/antiplatelets: Secondary | ICD-10-CM | POA: Insufficient documentation

## 2013-07-31 DIAGNOSIS — S0993XA Unspecified injury of face, initial encounter: Secondary | ICD-10-CM | POA: Diagnosis present

## 2013-07-31 DIAGNOSIS — Z79899 Other long term (current) drug therapy: Secondary | ICD-10-CM | POA: Insufficient documentation

## 2013-07-31 DIAGNOSIS — M5030 Other cervical disc degeneration, unspecified cervical region: Secondary | ICD-10-CM | POA: Diagnosis not present

## 2013-07-31 DIAGNOSIS — M542 Cervicalgia: Secondary | ICD-10-CM

## 2013-07-31 DIAGNOSIS — Z872 Personal history of diseases of the skin and subcutaneous tissue: Secondary | ICD-10-CM | POA: Diagnosis not present

## 2013-07-31 DIAGNOSIS — F329 Major depressive disorder, single episode, unspecified: Secondary | ICD-10-CM | POA: Insufficient documentation

## 2013-07-31 DIAGNOSIS — Z8673 Personal history of transient ischemic attack (TIA), and cerebral infarction without residual deficits: Secondary | ICD-10-CM | POA: Insufficient documentation

## 2013-07-31 DIAGNOSIS — E079 Disorder of thyroid, unspecified: Secondary | ICD-10-CM | POA: Insufficient documentation

## 2013-07-31 DIAGNOSIS — S0990XA Unspecified injury of head, initial encounter: Secondary | ICD-10-CM | POA: Insufficient documentation

## 2013-07-31 DIAGNOSIS — Z87891 Personal history of nicotine dependence: Secondary | ICD-10-CM | POA: Insufficient documentation

## 2013-07-31 DIAGNOSIS — S199XXA Unspecified injury of neck, initial encounter: Principal | ICD-10-CM

## 2013-07-31 DIAGNOSIS — IMO0002 Reserved for concepts with insufficient information to code with codable children: Secondary | ICD-10-CM | POA: Diagnosis not present

## 2013-07-31 DIAGNOSIS — Y92410 Unspecified street and highway as the place of occurrence of the external cause: Secondary | ICD-10-CM | POA: Insufficient documentation

## 2013-07-31 MED ORDER — HYDROCODONE-ACETAMINOPHEN 5-325 MG PO TABS: 1.0000 | ORAL_TABLET | ORAL | Status: DC | PRN

## 2013-07-31 MED ORDER — DIAZEPAM 5 MG PO TABS: 5.0000 mg | ORAL_TABLET | Freq: Three times a day (TID) | ORAL | Status: DC | PRN

## 2013-07-31 MED ORDER — IBUPROFEN 400 MG PO TABS
800.0000 mg | ORAL_TABLET | Freq: Once | ORAL | Status: AC
  Administered 2013-07-31: 800 mg via ORAL
  Filled 2013-07-31: qty 2
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
## 2013-07-31 NOTE — ED Notes (Signed)
Patient transported to X-ray 
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
## 2013-08-01 NOTE — ED Provider Notes (Signed)
Medical screening examination/treatment/procedure(s) were performed by non-physician practitioner and as supervising physician I was immediately available for consultation/collaboration.   EKG Interpretation None       Virgel Manifold, MD 08/01/13 [phone]

## 2014-12-30 ENCOUNTER — Encounter: Payer: Self-pay | Admitting: Internal Medicine

## 2015-01-01 ENCOUNTER — Encounter: Payer: Self-pay | Admitting: Internal Medicine

## 2017-04-11 ENCOUNTER — Encounter (HOSPITAL_COMMUNITY)
Admission: EM | Disposition: A | Discharge status: Home / Self Care | Payer: Self-pay | Source: Home / Self Care | Attending: Neurology

## 2017-04-11 ENCOUNTER — Other Ambulatory Visit: Payer: Self-pay

## 2017-04-11 ENCOUNTER — Inpatient Hospital Stay (HOSPITAL_COMMUNITY): Payer: Medicare Other

## 2017-04-11 ENCOUNTER — Inpatient Hospital Stay (HOSPITAL_COMMUNITY): Payer: Medicare Other | Admitting: Certified Registered Nurse Anesthetist

## 2017-04-11 ENCOUNTER — Inpatient Hospital Stay (HOSPITAL_COMMUNITY)
Admission: EM | Admit: 2017-04-11 | Discharge: 2017-04-15 | DRG: 041 | Disposition: A | Discharge status: Home / Self Care | Payer: Medicare Other | Attending: Neurology | Admitting: Neurology

## 2017-04-11 ENCOUNTER — Encounter (HOSPITAL_COMMUNITY): Payer: Self-pay | Admitting: Certified Registered Nurse Anesthetist

## 2017-04-11 ENCOUNTER — Emergency Department (HOSPITAL_COMMUNITY): Payer: Medicare Other

## 2017-04-11 DIAGNOSIS — Z7902 Long term (current) use of antithrombotics/antiplatelets: Secondary | ICD-10-CM

## 2017-04-11 DIAGNOSIS — F329 Major depressive disorder, single episode, unspecified: Secondary | ICD-10-CM | POA: Diagnosis present

## 2017-04-11 DIAGNOSIS — E7800 Pure hypercholesterolemia, unspecified: Secondary | ICD-10-CM | POA: Diagnosis present

## 2017-04-11 DIAGNOSIS — I639 Cerebral infarction, unspecified: Secondary | ICD-10-CM

## 2017-04-11 DIAGNOSIS — E669 Obesity, unspecified: Secondary | ICD-10-CM | POA: Diagnosis present

## 2017-04-11 DIAGNOSIS — D72829 Elevated white blood cell count, unspecified: Secondary | ICD-10-CM | POA: Diagnosis not present

## 2017-04-11 DIAGNOSIS — E785 Hyperlipidemia, unspecified: Secondary | ICD-10-CM | POA: Diagnosis present

## 2017-04-11 DIAGNOSIS — R29810 Facial weakness: Secondary | ICD-10-CM | POA: Diagnosis present

## 2017-04-11 DIAGNOSIS — M1990 Unspecified osteoarthritis, unspecified site: Secondary | ICD-10-CM | POA: Diagnosis present

## 2017-04-11 DIAGNOSIS — Z87891 Personal history of nicotine dependence: Secondary | ICD-10-CM

## 2017-04-11 DIAGNOSIS — G8191 Hemiplegia, unspecified affecting right dominant side: Secondary | ICD-10-CM | POA: Diagnosis present

## 2017-04-11 DIAGNOSIS — R29714 NIHSS score 14: Secondary | ICD-10-CM | POA: Diagnosis present

## 2017-04-11 DIAGNOSIS — Z6835 Body mass index (BMI) 35.0-35.9, adult: Secondary | ICD-10-CM | POA: Diagnosis not present

## 2017-04-11 DIAGNOSIS — R1310 Dysphagia, unspecified: Secondary | ICD-10-CM | POA: Diagnosis present

## 2017-04-11 DIAGNOSIS — I10 Essential (primary) hypertension: Secondary | ICD-10-CM | POA: Diagnosis present

## 2017-04-11 DIAGNOSIS — I6302 Cerebral infarction due to thrombosis of basilar artery: Secondary | ICD-10-CM | POA: Diagnosis not present

## 2017-04-11 DIAGNOSIS — E079 Disorder of thyroid, unspecified: Secondary | ICD-10-CM | POA: Diagnosis present

## 2017-04-11 DIAGNOSIS — I6502 Occlusion and stenosis of left vertebral artery: Secondary | ICD-10-CM | POA: Diagnosis not present

## 2017-04-11 DIAGNOSIS — R270 Ataxia, unspecified: Secondary | ICD-10-CM | POA: Diagnosis present

## 2017-04-11 DIAGNOSIS — I6602 Occlusion and stenosis of left middle cerebral artery: Secondary | ICD-10-CM | POA: Diagnosis present

## 2017-04-11 DIAGNOSIS — R471 Dysarthria and anarthria: Secondary | ICD-10-CM | POA: Diagnosis present

## 2017-04-11 DIAGNOSIS — I6322 Cerebral infarction due to unspecified occlusion or stenosis of basilar arteries: Principal | ICD-10-CM | POA: Diagnosis present

## 2017-04-11 DIAGNOSIS — E162 Hypoglycemia, unspecified: Secondary | ICD-10-CM | POA: Diagnosis present

## 2017-04-11 DIAGNOSIS — Z79890 Hormone replacement therapy (postmenopausal): Secondary | ICD-10-CM

## 2017-04-11 DIAGNOSIS — Z8673 Personal history of transient ischemic attack (TIA), and cerebral infarction without residual deficits: Secondary | ICD-10-CM | POA: Diagnosis not present

## 2017-04-11 DIAGNOSIS — I6789 Other cerebrovascular disease: Secondary | ICD-10-CM | POA: Diagnosis not present

## 2017-04-11 DIAGNOSIS — I651 Occlusion and stenosis of basilar artery: Secondary | ICD-10-CM | POA: Diagnosis not present

## 2017-04-11 HISTORY — PX: RADIOLOGY WITH ANESTHESIA: SHX6223

## 2017-04-11 HISTORY — PX: IR ANGIO VERTEBRAL SEL SUBCLAVIAN INNOMINATE UNI R MOD SED: IMG5365

## 2017-04-11 HISTORY — PX: IR ANGIO VERTEBRAL SEL VERTEBRAL UNI L MOD SED: IMG5367

## 2017-04-11 HISTORY — PX: IR ANGIO INTRA EXTRACRAN SEL COM CAROTID INNOMINATE BILAT MOD SED: IMG5360

## 2017-04-11 LAB — RAPID URINE DRUG SCREEN, HOSP PERFORMED
Amphetamines: NOT DETECTED
BARBITURATES: NOT DETECTED
Benzodiazepines: NOT DETECTED
COCAINE: NOT DETECTED
Opiates: NOT DETECTED
TETRAHYDROCANNABINOL: NOT DETECTED

## 2017-04-11 LAB — COMPREHENSIVE METABOLIC PANEL
ALBUMIN: 4.1 g/dL (ref 3.5–5.0)
ALT: 51 U/L (ref 17–63)
ANION GAP: 10 (ref 5–15)
AST: 49 U/L — ABNORMAL HIGH (ref 15–41)
Alkaline Phosphatase: 67 U/L (ref 38–126)
BILIRUBIN TOTAL: 0.9 mg/dL (ref 0.3–1.2)
BUN: 12 mg/dL (ref 6–20)
CO2: 24 mmol/L (ref 22–32)
Calcium: 9.4 mg/dL (ref 8.9–10.3)
Chloride: 103 mmol/L (ref 101–111)
Creatinine, Ser: 1.18 mg/dL (ref 0.61–1.24)
GFR calc non Af Amer: >60 mL/min (ref >60)
Glucose, Bld: 155 mg/dL — ABNORMAL HIGH (ref 65–99)
POTASSIUM: 3.8 mmol/L (ref 3.5–5.1)
Sodium: 137 mmol/L (ref 135–145)
TOTAL PROTEIN: 8.1 g/dL (ref 6.5–8.1)

## 2017-04-11 LAB — URINALYSIS, ROUTINE W REFLEX MICROSCOPIC
Bacteria, UA: NONE SEEN
Bilirubin Urine: NEGATIVE
GLUCOSE, UA: NEGATIVE mg/dL
HGB URINE DIPSTICK: NEGATIVE
KETONES UR: NEGATIVE mg/dL
LEUKOCYTES UA: NEGATIVE
Nitrite: NEGATIVE
PH: 8 (ref 5.0–8.0)
Protein, ur: 30 mg/dL — AB
Specific Gravity, Urine: 1.04 — ABNORMAL HIGH (ref 1.005–1.030)

## 2017-04-11 LAB — ECHOCARDIOGRAM COMPLETE
HEIGHTINCHES: 69 in
Weight: 3746.06 oz

## 2017-04-11 LAB — I-STAT TROPONIN, ED: TROPONIN I, POC: 0 ng/mL (ref 0.00–0.08)

## 2017-04-11 LAB — I-STAT CHEM 8, ED
BUN: 13 mg/dL (ref 6–20)
CHLORIDE: 104 mmol/L (ref 101–111)
Calcium, Ion: 1.18 mmol/L (ref 1.15–1.40)
Creatinine, Ser: 1 mg/dL (ref 0.61–1.24)
GLUCOSE: 159 mg/dL — AB (ref 65–99)
HCT: 48 % (ref 39.0–52.0)
Hemoglobin: 16.3 g/dL (ref 13.0–17.0)
Potassium: 3.8 mmol/L (ref 3.5–5.1)
SODIUM: 142 mmol/L (ref 135–145)
TCO2: 27 mmol/L (ref 22–32)

## 2017-04-11 LAB — PROTIME-INR
INR: 1.04
PROTHROMBIN TIME: 13.5 s (ref 11.4–15.2)

## 2017-04-11 LAB — CBC
HEMATOCRIT: 45.9 % (ref 39.0–52.0)
Hemoglobin: 15.6 g/dL (ref 13.0–17.0)
MCH: 30.7 pg (ref 26.0–34.0)
MCHC: 34 g/dL (ref 30.0–36.0)
MCV: 90.4 fL (ref 78.0–100.0)
Platelets: 316 10*3/uL (ref 150–400)
RBC: 5.08 MIL/uL (ref 4.22–5.81)
RDW: 14 % (ref 11.5–15.5)
WBC: 12.4 10*3/uL — AB (ref 4.0–10.5)

## 2017-04-11 LAB — CBG MONITORING, ED: Glucose-Capillary: 149 mg/dL — ABNORMAL HIGH (ref 65–99)

## 2017-04-11 LAB — DIFFERENTIAL
BASOS PCT: 0 %
Basophils Absolute: 0 10*3/uL (ref 0.0–0.1)
EOS PCT: 2 %
Eosinophils Absolute: 0.3 10*3/uL (ref 0.0–0.7)
Lymphocytes Relative: 28 %
Lymphs Abs: 3.5 10*3/uL (ref 0.7–4.0)
MONO ABS: 0.9 10*3/uL (ref 0.1–1.0)
MONOS PCT: 8 %
NEUTROS ABS: 7.7 10*3/uL (ref 1.7–7.7)
Neutrophils Relative %: 62 %

## 2017-04-11 LAB — APTT: aPTT: 33 seconds (ref 24–36)

## 2017-04-11 LAB — HEPARIN LEVEL (UNFRACTIONATED): Heparin Unfractionated: 0.21 IU/mL — ABNORMAL LOW (ref 0.30–0.70)

## 2017-04-11 LAB — MRSA PCR SCREENING: MRSA by PCR: NEGATIVE

## 2017-04-11 LAB — ETHANOL

## 2017-04-11 SURGERY — IR WITH ANESTHESIA
Anesthesia: General

## 2017-04-11 MED ORDER — LIDOCAINE HCL 1 % IJ SOLN
INTRAMUSCULAR | Status: DC | PRN
  Administered 2017-04-11: 10 mL

## 2017-04-11 MED ORDER — OXYCODONE HCL 5 MG/5ML PO SOLN: 5.0000 mg | Freq: Once | ORAL | Status: DC | PRN

## 2017-04-11 MED ORDER — OXYCODONE HCL 5 MG PO TABS: 5.0000 mg | ORAL_TABLET | Freq: Once | ORAL | Status: DC | PRN

## 2017-04-11 MED ORDER — HEPARIN (PORCINE) IN NACL 100-0.45 UNIT/ML-% IJ SOLN
1550.0000 [IU]/h | INTRAMUSCULAR | Status: DC
  Administered 2017-04-11: 1400 [IU]/h via INTRAVENOUS
  Administered 2017-04-12: 1550 [IU]/h via INTRAVENOUS
  Filled 2017-04-11 (×2): qty 250

## 2017-04-11 MED ORDER — SODIUM CHLORIDE 0.9 % IV SOLN
INTRAVENOUS | Status: DC
  Administered 2017-04-11: 22:00:00 via INTRAVENOUS

## 2017-04-11 MED ORDER — SERTRALINE HCL 100 MG PO TABS
200.0000 mg | ORAL_TABLET | Freq: Every day | ORAL | Status: DC
  Administered 2017-04-12 – 2017-04-15 (×4): 200 mg via ORAL
  Filled 2017-04-11 (×2): qty 2
  Filled 2017-04-11: qty 4
  Filled 2017-04-11: qty 2

## 2017-04-11 MED ORDER — SENNOSIDES-DOCUSATE SODIUM 8.6-50 MG PO TABS: 1.0000 | ORAL_TABLET | Freq: Every evening | ORAL | Status: DC | PRN

## 2017-04-11 MED ORDER — ONDANSETRON HCL 4 MG/2ML IJ SOLN
INTRAMUSCULAR | Status: AC
  Filled 2017-04-11: qty 2

## 2017-04-11 MED ORDER — SODIUM CHLORIDE 0.9 % IV SOLN
INTRAVENOUS | Status: DC
  Administered 2017-04-11 – 2017-04-12 (×2): via INTRAVENOUS

## 2017-04-11 MED ORDER — IOPAMIDOL (ISOVUE-300) INJECTION 61%
INTRAVENOUS | Status: AC
  Administered 2017-04-11: 10 mL
  Filled 2017-04-11: qty 50

## 2017-04-11 MED ORDER — SODIUM CHLORIDE 0.9 % IV BOLUS (SEPSIS): 1000.0000 mL | Freq: Once | INTRAVENOUS | Status: DC

## 2017-04-11 MED ORDER — CLOPIDOGREL BISULFATE 75 MG PO TABS
75.0000 mg | ORAL_TABLET | Freq: Every day | ORAL | Status: DC
  Administered 2017-04-11 – 2017-04-15 (×5): 75 mg via ORAL
  Filled 2017-04-11 (×5): qty 1

## 2017-04-11 MED ORDER — HEPARIN SODIUM (PORCINE) 1000 UNIT/ML IJ SOLN
INTRAMUSCULAR | Status: DC | PRN
  Administered 2017-04-11: 1000 [IU] via INTRAVENOUS

## 2017-04-11 MED ORDER — ACETAMINOPHEN 650 MG RE SUPP: 650.0000 mg | RECTAL | Status: DC | PRN

## 2017-04-11 MED ORDER — ACETAMINOPHEN 325 MG PO TABS: 650.0000 mg | ORAL_TABLET | ORAL | Status: DC | PRN

## 2017-04-11 MED ORDER — LEVOTHYROXINE SODIUM 100 MCG PO TABS
200.0000 ug | ORAL_TABLET | Freq: Every day | ORAL | Status: DC
  Administered 2017-04-12 – 2017-04-15 (×4): 200 ug via ORAL
  Filled 2017-04-11 (×4): qty 2

## 2017-04-11 MED ORDER — FENTANYL CITRATE (PF) 100 MCG/2ML IJ SOLN
INTRAMUSCULAR | Status: DC | PRN
  Administered 2017-04-11: 25 ug via INTRAVENOUS

## 2017-04-11 MED ORDER — HYDROMORPHONE HCL 1 MG/ML IJ SOLN: 0.2500 mg | INTRAMUSCULAR | Status: DC | PRN

## 2017-04-11 MED ORDER — LIDOCAINE HCL 1 % IJ SOLN
INTRAMUSCULAR | Status: AC
  Filled 2017-04-11: qty 20

## 2017-04-11 MED ORDER — IOPAMIDOL (ISOVUE-300) INJECTION 61%
INTRAVENOUS | Status: AC
  Administered 2017-04-11: 75 mL
  Filled 2017-04-11: qty 300

## 2017-04-11 MED ORDER — SODIUM CHLORIDE 0.9 % IV BOLUS (SEPSIS)
1000.0000 mL | Freq: Once | INTRAVENOUS | Status: AC
  Administered 2017-04-11: 1000 mL via INTRAVENOUS

## 2017-04-11 MED ORDER — ACETAMINOPHEN 160 MG/5ML PO SOLN: 650.0000 mg | ORAL | Status: DC | PRN

## 2017-04-11 MED ORDER — SIMVASTATIN 20 MG PO TABS
20.0000 mg | ORAL_TABLET | Freq: Every day | ORAL | Status: DC
  Administered 2017-04-11: 20 mg via ORAL
  Filled 2017-04-11: qty 1

## 2017-04-11 MED ORDER — BUPROPION HCL ER (SR) 150 MG PO TB12
150.0000 mg | ORAL_TABLET | Freq: Two times a day (BID) | ORAL | Status: DC
  Administered 2017-04-11 – 2017-04-15 (×8): 150 mg via ORAL
  Filled 2017-04-11 (×8): qty 1

## 2017-04-11 MED ORDER — IOPAMIDOL (ISOVUE-370) INJECTION 76%
INTRAVENOUS | Status: AC
  Administered 2017-04-11: 100 mL
  Filled 2017-04-11: qty 100

## 2017-04-11 MED ORDER — PROMETHAZINE HCL 25 MG/ML IJ SOLN: 6.2500 mg | INTRAMUSCULAR | Status: DC | PRN

## 2017-04-11 MED ORDER — ASPIRIN 300 MG RE SUPP
300.0000 mg | RECTAL | Status: AC
  Administered 2017-04-11: 300 mg via RECTAL
  Filled 2017-04-11: qty 1

## 2017-04-11 MED ORDER — ONDANSETRON HCL 4 MG/2ML IJ SOLN
4.0000 mg | Freq: Once | INTRAMUSCULAR | Status: AC
  Administered 2017-04-11: 4 mg via INTRAVENOUS

## 2017-04-11 MED ORDER — ARIPIPRAZOLE 10 MG PO TABS
5.0000 mg | ORAL_TABLET | Freq: Every day | ORAL | Status: DC
  Administered 2017-04-12 – 2017-04-15 (×4): 5 mg via ORAL
  Filled 2017-04-11 (×4): qty 1

## 2017-04-11 MED ORDER — STROKE: EARLY STAGES OF RECOVERY BOOK
Freq: Once | Status: AC
  Administered 2017-04-11: 13:00:00
  Filled 2017-04-11: qty 1

## 2017-04-11 MED ORDER — SODIUM CHLORIDE 0.9 % IV SOLN
INTRAVENOUS | Status: DC | PRN
  Administered 2017-04-11: 09:00:00 via INTRAVENOUS

## 2017-04-11 NOTE — Progress Notes (Signed)
  Echocardiogram 2D Echocardiogram has been performed.  Randy Shepherd 04/11/2017, 4:28 PM

## 2017-04-11 NOTE — Anesthesia Preprocedure Evaluation (Addendum)
Anesthesia Evaluation  Patient identified by MRN, date of birth, ID band Patient awake    Reviewed: Allergy & Precautions, NPO status , Patient's Chart, lab work & pertinent test results  Airway Mallampati: II  TM Distance: >3 FB Neck ROM: Full    Dental no notable dental hx.    Pulmonary neg pulmonary ROS, former smoker,    Pulmonary exam normal breath sounds clear to auscultation       Cardiovascular hypertension, Pt. on medications negative cardio ROS Normal cardiovascular exam Rhythm:Regular Rate:Normal     Neuro/Psych Depression CVA, Residual Symptoms negative neurological ROS  negative psych ROS   GI/Hepatic negative GI ROS, Neg liver ROS,   Endo/Other  negative endocrine ROS  Renal/GU negative Renal ROS  negative genitourinary   Musculoskeletal negative musculoskeletal ROS (+) Arthritis , Osteoarthritis,    Abdominal   Peds negative pediatric ROS (+)  Hematology negative hematology ROS (+)   Anesthesia Other Findings   Reproductive/Obstetrics negative OB ROS                            Anesthesia Physical Anesthesia Plan  ASA: IV and emergent  Anesthesia Plan: MAC   Post-op Pain Management:    Induction: Intravenous  PONV Risk Score and Plan: 2 and Ondansetron and Midazolam  Airway Management Planned: Oral ETT  Additional Equipment: Arterial line  Intra-op Plan:   Post-operative Plan: Extubation in OR and Possible Post-op intubation/ventilation  Informed Consent: I have reviewed the patients History and Physical, chart, labs and discussed the procedure including the risks, benefits and alternatives for the proposed anesthesia with the patient or authorized representative who has indicated his/her understanding and acceptance.   Dental advisory given  Plan Discussed with: CRNA  Anesthesia Plan Comments:       Anesthesia Quick Evaluation

## 2017-04-11 NOTE — Transfer of Care (Signed)
Immediate Anesthesia Transfer of Care Note  Patient: Randy Shepherd  Procedure(s) Performed: IR WITH ANESTHESIA (N/A )  Patient Location: PACU  Anesthesia Type:MAC  Level of Consciousness: awake, alert  and oriented  Airway & Oxygen Therapy: Patient Spontanous Breathing  Post-op Assessment: Report given to RN and Post -op Vital signs reviewed and stable  Post vital signs: Reviewed and stable  Last Vitals:  Vitals:   04/11/17 0815 04/11/17 1034  BP: 138/67 (!) 158/91  Pulse:  67  Resp:  10  Temp:  36.9 C  SpO2:  97%    Last Pain:  Vitals:   04/11/17 1034  TempSrc: Oral         Complications: No apparent anesthesia complications

## 2017-04-11 NOTE — Progress Notes (Signed)
PT Cancellation Note  Patient Details Name: MOHAN ERVEN MRN: [id number] DOB: Nov 09, 1950   Cancelled Treatment:    Reason Eval/Treat Not Completed: Patient not medically ready, bedrest at this time   Duncan Dull 04/11/2017, 3:38 PM
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
## 2017-04-11 NOTE — ED Provider Notes (Signed)
[facility] DEPARTMENT Provider Note   CSN: [id number] Arrival date & time: 04/11/17  0725     History   Chief Complaint Chief Complaint  Patient presents with  . Code Stroke    HPI Randy Shepherd is a 66 y.o. male.  HPI   66 year old male with past medical history of hypertension, hyperlipidemia, previous stroke, who presents with dizziness, slurred speech, and right arm tingling.  The patient states that his symptoms initially happened yesterday.  He had approximately 1-2-hour episode of dizziness, slurred speech, and right arm numbness.  This then resolved completely.  He was normal when he went to bed.  Upon awakening this morning, however, he noticed that he felt extremely dizzy, like the room was spinning, and numb in his right arm with slurred speech.  He continues to have slurred speech at this time.  Denies any headache.  Denies any ongoing vertigo.  He states his numbness has largely resolved.  No recent medication change.  Has been taking his medications as prescribed.  Patient is on Plavix.  No recent trauma or head injuries.  Past Medical History:  Diagnosis Date  . Abscess 10/07/2012   RT KNEE  . Degenerative disc disease   . Depression   . DJD (degenerative joint disease)   . High cholesterol   . Hypertension   . Stroke   . Thyroid disease     Patient Active Problem List   Diagnosis Date Noted  . Cellulitis and abscess of leg 10/06/2012  . Hypertension 10/06/2012  . Hyperlipidemia 10/06/2012  . History of stroke 10/06/2012  . Thyroid dysfunction 10/06/2012    Past Surgical History:  Procedure Laterality Date  . APPENDECTOMY         Home Medications    Prior to Admission medications   Medication Sig Start Date End Date Taking? Authorizing Provider  amLODipine (NORVASC) 10 MG tablet Take 5 mg by mouth daily.    [provider]  benazepril (LOTENSIN) 40 MG tablet Take 40 mg by mouth daily.    [provider]  buPROPion (WELLBUTRIN) 100 MG tablet Take 200 mg by mouth 2 (two) times daily.     [provider]  Cholecalciferol (VITAMIN D3) 2000 UNITS TABS Take 2,000 Units by mouth daily.     [provider]  clopidogrel (PLAVIX) 75 MG tablet Take 75 mg by mouth daily.    [provider]  diazepam (VALIUM) 5 MG tablet Take 1 tablet (5 mg total) by mouth every 8 (eight) hours as needed (muscle spasm or pain). 07/31/13   Clayton Bibles, PA-C  divalproex (DEPAKOTE ER) 250 MG 24 hr tablet Take 750 mg by mouth at bedtime.     [provider]  gabapentin (NEURONTIN) 300 MG capsule Take 300-600 mg by mouth See admin instructions. Take 300 mg by mouth four times daily, then take 600 mg by mouth at bedtime.    [provider]  HYDROcodone-acetaminophen (NORCO/VICODIN) 5-325 MG per tablet Take 1 tablet by mouth every 4 (four) hours as needed for moderate pain or severe pain. 07/31/13   Clayton Bibles, PA-C  levothyroxine (SYNTHROID, LEVOTHROID) 125 MCG tablet Take 125 mcg by mouth daily before breakfast.    [provider]  simvastatin (ZOCOR) 40 MG tablet Take 20 mg by mouth every evening.    [provider]  verapamil (CALAN-SR) 180 MG CR tablet Take 180 mg by mouth at bedtime.    [provider]  Family History No family history on file.  Social History Social History   Tobacco Use  . Smoking status: Former Research scientist (life sciences)  . Smokeless tobacco: Never Used  Substance Use Topics  . Alcohol use: Yes    Alcohol/week: 1.8 oz    Types: 3 Cans of beer per week    Comment: DAILY BEER  . Drug use: No     Allergies   Patient has no known allergies.   Review of Systems Review of Systems  Neurological: Positive for dizziness, speech difficulty and numbness.  All other systems reviewed and are negative.    Physical Exam Updated Vital Signs BP 138/67   Pulse 79   Temp 97.6 F (36.4 C) (Oral)   Resp 18   SpO2 98%   Physical  Exam  Constitutional: He is oriented to person, place, and time. He appears well-developed and well-nourished. No distress.  HENT:  Head: Normocephalic and atraumatic.  Eyes: Conjunctivae are normal.  Neck: Neck supple.  Cardiovascular: Normal rate, regular rhythm and normal heart sounds. Exam reveals no friction rub.  No murmur heard. Pulmonary/Chest: Effort normal and breath sounds normal. No respiratory distress. He has no wheezes. He has no rales.  Abdominal: He exhibits no distension.  Musculoskeletal: He exhibits no edema.  Neurological: He is alert and oriented to person, place, and time. He exhibits normal muscle tone.  Skin: Skin is warm. Capillary refill takes less than 2 seconds.  Psychiatric: He has a normal mood and affect.  Nursing note and vitals reviewed.   Neurological Exam:  Mental Status: Alert and oriented to person, place, and time. Attention and concentration normal. Speech with very mild dysarthria. Recent memory is intact. Cranial Nerves: Visual fields grossly intact. EOMI and PERRLA. No nystagmus noted. Facial sensation intact at forehead, maxillary cheek, and chin/mandible bilaterally. No facial asymmetry or weakness. Hearing grossly normal. Uvula is midline, and palate elevates symmetrically. Normal SCM and trapezius strength. Tongue midline without fasciculations. Motor: Muscle strength 5/5 in proximal and distal UE and LE bilaterally. No pronator drift. Muscle tone normal. Reflexes: 2+ and symmetrical in all four extremities.  Sensation: Intact to light touch in upper and lower extremities distally bilaterally.  Gait: Normal without ataxia. Coordination: Normal FTN bilaterally.    ED Treatments / Results  Labs (all labs ordered are listed, but only abnormal results are displayed) Labs Reviewed  CBC - Abnormal; Notable for the following components:      Result Value   WBC 12.4 (*)    All other components within normal limits  COMPREHENSIVE METABOLIC  PANEL - Abnormal; Notable for the following components:   Glucose, Bld 155 (*)    AST 49 (*)    All other components within normal limits  I-STAT CHEM 8, ED - Abnormal; Notable for the following components:   Glucose, Bld 159 (*)    All other components within normal limits  CBG MONITORING, ED - Abnormal; Notable for the following components:   Glucose-Capillary 149 (*)    All other components within normal limits  ETHANOL  PROTIME-INR  APTT  DIFFERENTIAL  RAPID URINE DRUG SCREEN, HOSP PERFORMED  URINALYSIS, ROUTINE W REFLEX MICROSCOPIC  I-STAT TROPONIN, ED    EKG  EKG Interpretation None       Radiology Ct Cerebral Perfusion W Contrast  Result Date: 04/11/2017 CLINICAL DATA:  Slurred speech since last night. Right-sided weakness. Upper extremity numbness and tingling. EXAM: CT PERFUSION BRAIN TECHNIQUE: Multiphase CT imaging of the brain was performed following IV  bolus contrast injection. Subsequent parametric perfusion maps were calculated using RAPID software. CONTRAST:  146mL ISOVUE-370 IOPAMIDOL (ISOVUE-370) INJECTION 76% COMPARISON:  CT head without contrast 04/11/2017 FINDINGS: CT Brain Perfusion Findings: CBF (<30%) Volume: 39mL Perfusion (Tmax>6.0s) volume: 46mL Mismatch Volume: 70mL Infarction Location:None IMPRESSION: Unremarkable CT perfusion exam. Electronically Signed   By: San Morelle M.D.   On: 04/11/2017 08:21   Ct Head Code Stroke Wo Contrast  Result Date: 04/11/2017 CLINICAL DATA:  Code stroke. Slurred speech beginning last evening. Numbness and tingling in the upper extremities. Right-sided weakness. EXAM: CT HEAD WITHOUT CONTRAST TECHNIQUE: Contiguous axial images were obtained from the base of the skull through the vertex without intravenous contrast. COMPARISON:  MRI brain 08/26/2003. FINDINGS: Brain: The a remote left basal ganglia lacunar infarct is noted. Mild diffuse subcortical white matter changes are advanced for age. No focal cortical infarct  or lesion is present. Ventricles are proportionate to the degree of atrophy. No significant extra-axial fluid collection is present. Vascular: Asymmetric hyperdensity is present in the right M1 segment. Atherosclerotic calcifications are present at the cavernous internal carotid artery is bilaterally. Skull: Calvarium is intact. No focal lytic or blastic lesions are present. Sinuses/Orbits: The paranasal sinuses and mastoid air cells are clear. Globes and orbits are within normal limits. ASPECTS [facility] Stroke Program Early CT Score) - Ganglionic level infarction (caudate, lentiform nuclei, internal capsule, insula, M1-M3 cortex): 7/7 - Supraganglionic infarction (M4-M6 cortex): 3/3 Total score (0-10 with 10 being normal): 10/10 IMPRESSION: 1. No acute or focal cortical abnormality. 2. Asymmetric hyperdense right M1 segment. This is discordant from the patient's symptoms. Large vessel occlusion is considered less likely. 3. Remote lacunar infarct of the left basal ganglia. 4. Atrophy and white matter changes are mildly advanced for age 56. ASPECTS is 10/10 These results were called by telephone at the time of interpretation on 04/11/2017 at 7:52 am to Dr. Leonel Ramsay, who verbally acknowledged these results. Electronically Signed   By: San Morelle M.D.   On: 04/11/2017 07:53    Procedures Procedures (including critical care time)  Medications Ordered in ED Medications  ondansetron (ZOFRAN) 4 MG/2ML injection (not administered)  sodium chloride 0.9 % bolus 1,000 mL (1,000 mLs Intravenous New Bag/Given 04/11/17 0815)  iopamidol (ISOVUE-370) 76 % injection (100 mLs  Contrast Given 04/11/17 0756)  ondansetron (ZOFRAN) injection 4 mg (4 mg Intravenous Given 04/11/17 0815)  aspirin suppository 300 mg (300 mg Rectal Given 04/11/17 0818)     Initial Impression / Assessment and Plan / ED Course  I have reviewed the triage vital signs and the nursing notes.  Pertinent labs & imaging results that  were available during my care of the patient were reviewed by me and considered in my medical decision making (see chart for details).  Clinical Course as of Apr 12 831  Wed Apr 11, 2017  0831 Sodium: 137 [CI]    Clinical Course User Index [CI] Duffy Bruce, MD    66 year old male who presents with slurred speech, right arm numbness, and vertigo.  He had similar symptoms yesterday that resolved completely.  He does have a history of stroke.  Patient was activated as a code stroke by nursing.  I assessed his airway which appears to be intact.  However, his last known normal does seem to be last night.  His symptoms are improving, but due to his persistent dysarthria, I do feel it is reasonable to obtain a stat CT scan and neurology consultation.  Unclear whether this represents a primary  stroke versus possibly stuttering TIAs.  While in the ED, patient developed her recurrence of right-sided hemiplegia.  This slowly is improving.  Dr. Leonel Ramsay of neurology has evaluated.  No LVO on CT.  Patient is not a candidate for TPA.  Will give aspirin, fluids, supportive care, and admit.  Final Clinical Impressions(s) / ED Diagnoses   Final diagnoses:  Acute ischemic stroke [facility])    ED Discharge Orders    None       Duffy Bruce, MD 04/11/17 [phone]

## 2017-04-11 NOTE — Progress Notes (Signed)
ANTICOAGULATION CONSULT NOTE - Initial Consult  Pharmacy Consult for heparin. Indication: stroke  No Known Allergies  Patient Measurements: Height: 5\' 9"  (175.3 cm) Weight: 234 lb 2.1 oz (106.2 kg) IBW/kg (Calculated) : 70.7 Heparin Dosing Weight: 106.2  Vital Signs: Temp: 98.4 F (36.9 C) (12/12 1034) Temp Source: Oral (12/12 1034) BP: 146/75 (12/12 1100) Pulse Rate: 69 (12/12 1100)  Labs: Recent Labs    04/11/17 0732 04/11/17 0741  HGB 15.6 16.3  HCT 45.9 48.0  PLT 316  --   APTT 33  --   LABPROT 13.5  --   INR 1.04  --   CREATININE 1.18 1.00    Estimated Creatinine Clearance: 87.3 mL/min (by C-G formula based on SCr of 1 mg/dL).   Medical History: Past Medical History:  Diagnosis Date  . Abscess 10/07/2012   RT KNEE  . Degenerative disc disease   . Depression   . DJD (degenerative joint disease)   . High cholesterol   . Hypertension   . Stroke ([place])   . Thyroid disease     Medications:  Scheduled:  .  stroke: mapping our early stages of recovery book   Does not apply Once  . clopidogrel  75 mg Oral Daily  . lidocaine      . ondansetron        Assessment: 66 yo male presented to ED on 12/12 with right sided weakness and facial droop. Patient was out of TPA window. Starting heparin per stroke protocol.  Goal of Therapy:  Heparin level 0.3-0.5 units/ml Monitor platelets by anticoagulation protocol: Yes   Plan:  Start heparin infusion at 1,400 units/hr Continue to monitor H&H and platelets  6 hour HL ordered  Sireen Halk A Marien Manship 04/11/2017,11:49 AM

## 2017-04-11 NOTE — Procedures (Signed)
S/P 4 vessel cerebral arteriogram. RT CFA approach. Findings. 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 3.Approx 50  To 75 % stenosis of Lt MCA distal M1. 4.Retrograde flow via RT PCOM to origin of AICAs

## 2017-04-11 NOTE — Anesthesia Postprocedure Evaluation (Signed)
Anesthesia Post Note  Patient: Randy Shepherd  Procedure(s) Performed: IR WITH ANESTHESIA (N/A )     Patient location during evaluation: PACU Anesthesia Type: MAC Level of consciousness: awake and alert Pain management: pain level controlled Vital Signs Assessment: post-procedure vital signs reviewed and stable Respiratory status: spontaneous breathing, nonlabored ventilation and respiratory function stable Cardiovascular status: stable and blood pressure returned to baseline Postop Assessment: no apparent nausea or vomiting Anesthetic complications: no    Last Vitals:  Vitals:   04/11/17 1100 04/11/17 1200  BP: (!) 146/75 135/80  Pulse: 69 66  Resp: 14 11  Temp:    SpO2: 98% 97%    Last Pain:  Vitals:   04/11/17 1034  TempSrc: Oral                 Lynda Rainwater

## 2017-04-11 NOTE — Progress Notes (Signed)
[place] for heparin. Indication: stroke  No Known Allergies  Patient Measurements: Height: 5\' 9"  (175.3 cm) Weight: 234 lb 2.1 oz (106.2 kg) IBW/kg (Calculated) : 70.7 Heparin Dosing Weight: 106.2  Vital Signs: Temp: 98.1 F (36.7 C) (12/12 1600) Temp Source: Oral (12/12 1034) BP: 142/85 (12/12 1900) Pulse Rate: 85 (12/12 1900)  Labs: Recent Labs    04/11/17 0732 04/11/17 0741 04/11/17 1928  HGB 15.6 16.3  --   HCT 45.9 48.0  --   PLT 316  --   --   APTT 33  --   --   LABPROT 13.5  --   --   INR 1.04  --   --   HEPARINUNFRC  --   --  0.21*  CREATININE 1.18 1.00  --     Estimated Creatinine Clearance: 87.3 mL/min (by C-G formula based on SCr of 1 mg/dL).   Assessment: 66 yo male presented to ED on 12/12 with right sided weakness and facial droop. Patient was out of TPA window. Starting heparin per stroke protocol.  Initial heparin level = 0.21  Goal of Therapy:  Heparin level 0.3-0.5 units/ml Monitor platelets by anticoagulation protocol: Yes   Plan:  Increase heparin to 1550 units / hr Next level in AM  Thank you Anette Guarneri, PharmD [phone]  04/11/2017,8:26 PM

## 2017-04-11 NOTE — Consult Note (Deleted)
Neurology Consultation Reason for Consult: Ischemic Stroke  Referring Physician: Ellender Hose, C  CC: Right sided weakness  History is obtained from:Patient  HPI: Randy Shepherd is a 66 y.o. male with a history of htn, hl, stroke who presents with recurrent episodes of right sided weakness that started last night. On awakening, he had difficulty getting out of bed and his right arm was numb. This improved, but he always had persistent numbness. On arrival to [facility], he had mild facial droop and right sided numbness, but no other symptoms. Following his CT, he developed sudden worsening of severe right sided weakness. 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    LKW: Last night, 7pm(when he went to bed) tpa given?: no, out of window.    ROS: A 14 point ROS was performed and is negative except as noted in the HPI.   Past Medical History:  Diagnosis Date  . Abscess 10/07/2012   RT KNEE  . Degenerative disc disease   . Depression   . DJD (degenerative joint disease)   . High cholesterol   . Hypertension   . Stroke   . Thyroid disease     FHx: no hx of similar   Social History:  reports that he has quit smoking. he has never used smokeless tobacco. He reports that he drinks about 1.8 oz of alcohol per week. He reports that he does not use drugs.   Exam: Current vital signs: BP (!) 160/84   Pulse 79   Temp 97.6 F (36.4 C) (Oral)   Resp 18   SpO2 98%  Vital signs in last 24 hours: Temp:  [97.6 F (36.4 C)] 97.6 F (36.4 C) (12/12 0731) Pulse Rate:  [79] 79 (12/12 0731) Resp:  [18] 18 (12/12 0731) BP: (153-160)/(84-114) 160/84 (12/12 0750) SpO2:  [98 %] 98 % (12/12 0731)   Physical Exam  Constitutional: Appears well-developed and well-nourished.  Psych: Affect appropriate to situation Eyes: No scleral injection HENT: No OP obstrucion Head: Normocephalic.  Cardiovascular: Normal rate and regular rhythm.  Respiratory: Effort normal, non-labored  breathing GI: Soft.  No distension. There is no tenderness.  Skin: WDI  Neuro: Mental Status: Patient is awake, alert, oriented to person, place, month, year, and situation. Patient is able to give a clear and coherent history. No signs of aphasia or neglect Cranial Nerves: II: Visual Fields are full. Pupils are equal, round, and reactive to light.   III,IV, VI: EOMI without ptosis or diploplia.  V: Facial sensation is symmetric to temperature VII: Facial movement is symmetric.  VIII: hearing is intact to voice X: Uvula elevates symmetrically XI: Shoulder shrug is symmetric. XII: tongue is midline without atrophy or fasciculations.  Motor: Tone is normal. Bulk is normal. 5/5 strength was present in all four extremities.  Sensory: Sensation is decreased in the right arm.  Cerebellar: FNF  intact bilaterally  Following my initial exam, he had abrupt worsening with development of severe right-sided hemiplegia, 1/5 in the right leg, 0/5 in the right arm, severe right hemi-facial weakness, severe dysarthria.   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  The chronicity of this lesion is unclear given that the last imaging we have is in 2005.  I do think a  catheter angiogram could be helpful to determine if it is truly completely occluded versus severely stenotic.  Recommendations: 1.  If no intervention then stroke protocol heparin  2. MRI, MRA  of the brain without contrast 3. Frequent neuro checks 4. Echocardiogram 5. HgbA1c, fasting lipid panel 6. Prophylactic therapy-dual antiplatelet therapy 7. Risk factor modification 8. Telemetry monitoring 9. PT consult, OT consult, Speech  consult 10.  Permissive hypertension, goal at least 140, though I am not sure I would use pressors unless he has worsening of his exam.    This patient is critically ill and at significant risk of neurological worsening, death and care requires constant monitoring of vital signs, hemodynamics,respiratory and cardiac monitoring, neurological assessment, discussion with family, other specialists and medical decision making of high complexity. I spent 60 minutes of neurocritical care time  in the care of  this patient.  Roland Rack, MD [facility] [phone]  If 7pm- 7am, please page neurology on call as listed in [place]. 04/11/2017  10:59 AM

## 2017-04-11 NOTE — Addendum Note (Signed)
Addendum  created 04/11/17 1531 by Eligha Bridegroom, CRNA   Intraprocedure Attestations filed

## 2017-04-11 NOTE — Addendum Note (Signed)
Addendum  created 04/11/17 1241 by Lynda Rainwater, MD   Sign clinical note

## 2017-04-11 NOTE — ED Notes (Signed)
Upon completion of CT scan pts speech is more slurred, neurologist Leonel Ramsay made aware and at bedside

## 2017-04-11 NOTE — H&P (Signed)
Neurology H&P Reason for Consult: Ischemic Stroke  Referring Physician: Ellender Hose, C  CC: Right sided weakness  History is obtained from:Patient  HPI: Randy Shepherd is a 66 y.o. male with a history of htn, hl, stroke who presents with recurrent episodes of right sided weakness that started last night. On awakening, he had difficulty getting out of bed and his right arm was numb. This improved, but he always had persistent numbness. On arrival to [facility], he had mild facial droop and right sided numbness, but no other symptoms. Following his CT, he developed sudden worsening of severe right sided weakness. 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    LKW: Last night, 7pm(when he went to bed) tpa given?: no, out of window.    ROS: A 14 point ROS was performed and is negative except as noted in the HPI.   Past Medical History:  Diagnosis Date  . Abscess 10/07/2012   RT KNEE  . Degenerative disc disease   . Depression   . DJD (degenerative joint disease)   . High cholesterol   . Hypertension   . Stroke ([place])   . Thyroid disease     FHx: no hx of similar   Social History:  reports that he has quit smoking. he has never used smokeless tobacco. He reports that he drinks about 1.8 oz of alcohol per week. He reports that he does not use drugs.   Exam: Current vital signs: BP (!) 158/91 (BP Location: Left Arm)   Pulse 67   Temp 98.4 F (36.9 C) (Oral)   Resp 10   Ht 5\' 9"  (1.753 m)   Wt 106.2 kg (234 lb 2.1 oz)   SpO2 97%   BMI 34.57 kg/m  Vital signs in last 24 hours: Temp:  [97.6 F (36.4 C)-98.4 F (36.9 C)] 98.4 F (36.9 C) (12/12 1034) Pulse Rate:  [67-79] 67 (12/12 1034) Resp:  [10-18] 10 (12/12 1034) BP: (138-160)/(67-114) 158/91 (12/12 1034) SpO2:  [97 %-98 %] 97 % (12/12 1034) Weight:  [106.2 kg (234 lb 2.1 oz)] 106.2 kg (234 lb 2.1 oz) (12/12 1000)   Physical Exam  Constitutional: Appears well-developed and well-nourished.  Psych: Affect  appropriate to situation Eyes: No scleral injection HENT: No OP obstrucion Head: Normocephalic.  Cardiovascular: Normal rate and regular rhythm.  Respiratory: Effort normal, non-labored breathing GI: Soft.  No distension. There is no tenderness.  Skin: WDI  Neuro: Mental Status: Patient is awake, alert, oriented to person, place, month, year, and situation. Patient is able to give a clear and coherent history. No signs of aphasia or neglect Cranial Nerves: II: Visual Fields are full. Pupils are equal, round, and reactive to light.   III,IV, VI: EOMI without ptosis or diploplia.  V: Facial sensation is symmetric to temperature VII: Facial movement is symmetric.  VIII: hearing is intact to voice X: Uvula elevates symmetrically XI: Shoulder shrug is symmetric. XII: tongue is midline without atrophy or fasciculations.  Motor: Tone is normal. Bulk is normal. 5/5 strength was present in all four extremities.  Sensory: Sensation is decreased in the right arm.  Cerebellar: FNF  intact bilaterally  Following my initial exam, he had abrupt worsening with development of severe right-sided hemiplegia, 1/5 in the right leg, 0/5 in the right arm, severe right hemi-facial weakness, severe dysarthria.   I have reviewed labs in epic and the results pertinent to this consultation are: CMP-unremarkable Chem-8-unremarkable CBC-mildly elevated WBC  I have reviewed the images  obtained: CT head-unremarkable CTA- basilar occlusion  Impression: 66 year old male with rapid waxing/waning of severe stroke symptoms in the setting of basilar occlusion.  The chronicity of this lesion is unclear given that the last imaging we have is in 2005.  I do think a catheter angiogram could be helpful to determine if it is truly completely occluded versus severely stenotic.  Recommendations: 1.  If no intervention then stroke protocol heparin  2. MRI, MRA  of the brain without contrast 3. Frequent neuro  checks 4. Echocardiogram 5. HgbA1c, fasting lipid panel 6. Prophylactic therapy-dual antiplatelet therapy 7. Risk factor modification 8. Telemetry monitoring 9. PT consult, OT consult, Speech consult 10.  Permissive hypertension, goal at least 140, though I am not sure I would use pressors unless he has worsening of his exam.    This patient is critically ill and at significant risk of neurological worsening, death and care requires constant monitoring of vital signs, hemodynamics,respiratory and cardiac monitoring, neurological assessment, discussion with family, other specialists and medical decision making of high complexity. I spent 60 minutes of neurocritical care time  in the care of  this patient.  Roland Rack, MD [facility] [phone]  If 7pm- 7am, please page neurology on call as listed in [place]. 04/11/2017  10:59 AM

## 2017-04-12 ENCOUNTER — Encounter (HOSPITAL_COMMUNITY): Payer: Self-pay | Admitting: Interventional Radiology

## 2017-04-12 DIAGNOSIS — I6302 Cerebral infarction due to thrombosis of basilar artery: Secondary | ICD-10-CM

## 2017-04-12 LAB — HEMOGLOBIN A1C
Hgb A1c MFr Bld: 5.9 % — ABNORMAL HIGH (ref 4.8–5.6)
Mean Plasma Glucose: 122.63 mg/dL

## 2017-04-12 LAB — HEPARIN LEVEL (UNFRACTIONATED): Heparin Unfractionated: 0.38 IU/mL (ref 0.30–0.70)

## 2017-04-12 LAB — LIPID PANEL
CHOL/HDL RATIO: 4.1 ratio
Cholesterol: 169 mg/dL (ref 0–200)
HDL: 41 mg/dL (ref >40)
LDL CALC: 104 mg/dL — AB (ref 0–99)
TRIGLYCERIDES: 119 mg/dL (ref <150)
VLDL: 24 mg/dL (ref 0–40)

## 2017-04-12 LAB — CBC
HCT: 41.6 % (ref 39.0–52.0)
Hemoglobin: 14 g/dL (ref 13.0–17.0)
MCH: 30.4 pg (ref 26.0–34.0)
MCHC: 33.7 g/dL (ref 30.0–36.0)
MCV: 90.4 fL (ref 78.0–100.0)
PLATELETS: 254 10*3/uL (ref 150–400)
RBC: 4.6 MIL/uL (ref 4.22–5.81)
RDW: 14.1 % (ref 11.5–15.5)
WBC: 14.8 10*3/uL — ABNORMAL HIGH (ref 4.0–10.5)

## 2017-04-12 MED ORDER — ASPIRIN EC 81 MG PO TBEC
81.0000 mg | DELAYED_RELEASE_TABLET | Freq: Every day | ORAL | Status: DC
  Administered 2017-04-12 – 2017-04-14 (×3): 81 mg via ORAL
  Filled 2017-04-12 (×4): qty 1

## 2017-04-12 MED ORDER — BENAZEPRIL HCL 20 MG PO TABS
40.0000 mg | ORAL_TABLET | Freq: Every day | ORAL | Status: DC
  Administered 2017-04-12: 40 mg via ORAL
  Filled 2017-04-12: qty 2

## 2017-04-12 MED ORDER — SODIUM CHLORIDE 0.9 % IV SOLN
INTRAVENOUS | Status: DC
  Administered 2017-04-12: 17:00:00 via INTRAVENOUS
  Administered 2017-04-12: 1000 mL via INTRAVENOUS

## 2017-04-12 MED ORDER — HYDRALAZINE HCL 20 MG/ML IJ SOLN: 5.0000 mg | INTRAMUSCULAR | Status: DC | PRN

## 2017-04-12 MED ORDER — AMLODIPINE BESYLATE 10 MG PO TABS
10.0000 mg | ORAL_TABLET | Freq: Every day | ORAL | Status: DC
  Administered 2017-04-12 – 2017-04-15 (×4): 10 mg via ORAL
  Filled 2017-04-12 (×4): qty 1

## 2017-04-12 MED ORDER — SIMVASTATIN 40 MG PO TABS: 40.0000 mg | ORAL_TABLET | Freq: Every day | ORAL | Status: DC

## 2017-04-12 MED ORDER — ATORVASTATIN CALCIUM 10 MG PO TABS
20.0000 mg | ORAL_TABLET | Freq: Every day | ORAL | Status: DC
  Administered 2017-04-12 – 2017-04-14 (×3): 20 mg via ORAL
  Filled 2017-04-12 (×3): qty 2

## 2017-04-12 NOTE — Progress Notes (Signed)
MD paged with current SYS BP in 120s. No neuro change. NIH remains 0. MD okay with SYS >100 with no neuro changes.

## 2017-04-12 NOTE — Progress Notes (Signed)
[place] for heparin Indication: stroke  No Known Allergies  Patient Measurements: Height: 5\' 9"  (175.3 cm) Weight: 234 lb 2.1 oz (106.2 kg) IBW/kg (Calculated) : 70.7 Heparin Dosing Weight: 106.2  Vital Signs: Temp: 97.5 F (36.4 C) (12/13 0400) Temp Source: Oral (12/13 0400) BP: 137/86 (12/13 0600) Pulse Rate: 74 (12/13 0600)  Labs: Recent Labs    04/11/17 0732 04/11/17 0741 04/11/17 1928 04/12/17 0238  HGB 15.6 16.3  --  14.0  HCT 45.9 48.0  --  41.6  PLT 316  --   --  254  APTT 33  --   --   --   LABPROT 13.5  --   --   --   INR 1.04  --   --   --   HEPARINUNFRC  --   --  0.21* 0.38  CREATININE 1.18 1.00  --   --     Estimated Creatinine Clearance: 87.3 mL/min (by C-G formula based on SCr of 1 mg/dL).   Assessment: 66 yo male presented to ED on 12/12 with right sided weakness and facial droop. Patient was out of TPA window. Heparin was started yesterday, level therapeutic, cbc stable.   Goal of Therapy:  Heparin level 0.3-0.5 units/ml Monitor platelets by anticoagulation protocol: Yes    Plan:  -Continue heparin at 1550 units/hr -Daily HL, CBC -F/u plans for oral anticoagulation   Harvel Quale  04/12/2017 7:36 AM

## 2017-04-12 NOTE — Progress Notes (Signed)
Occupational Therapy Evaluation Patient Details Name: Randy Shepherd MRN: [id number] DOB: [birth year] Today's Date: 04/12/2017    History of Present Illness  66 y.o. male with a history of HTN, HLD, stroke who presents with recurrent episodes of right sided weakness with basilar occlusion. Underwent IR 04/11/17.    Clinical Impression   Pt appears to be functioning at his baseline. No apparent deficits. Completed education regarding warning signs and symptoms of CVA using BeFast. Pt verbalized understanding. OT signing off.     Follow Up Recommendations  No OT follow up;Supervision - Intermittent    Equipment Recommendations  None recommended by OT    Recommendations for Other Services       Precautions / Restrictions Precautions Precautions: None      Mobility Bed Mobility Overal bed mobility: Modified Independent                Transfers Overall transfer level: Independent                    Balance Overall balance assessment: No apparent balance deficits (not formally assessed)                                         ADL either performed or assessed with clinical judgement   ADL Overall ADL's : At baseline                                             Vision Baseline Vision/History: Wears glasses Wears Glasses: Reading only Vision Assessment?: No apparent visual deficits     Perception     Praxis Praxis Praxis tested?: Within functional limits    Pertinent Vitals/Pain Pain Assessment: No/denies pain     Hand Dominance Right   Extremity/Trunk Assessment Upper Extremity Assessment Upper Extremity Assessment: Overall WFL for tasks assessed   Lower Extremity Assessment Lower Extremity Assessment: Defer to PT evaluation   Cervical / Trunk Assessment Cervical / Trunk Assessment: Normal   Communication Communication Communication: No difficulties   Cognition Arousal/Alertness:  Awake/alert Behavior During Therapy: WFL for tasks assessed/performed Overall Cognitive Status: Within Functional Limits for tasks assessed                                     General Comments       Exercises     Shoulder Instructions      Home Living Family/patient expects to be discharged to:: Private residence Living Arrangements: Spouse/significant other;Other relatives Available Help at Discharge: Family;Available PRN/intermittently(wife works) Type of Home: House Home Access: Stairs to enter Technical brewer of Steps: 3 Entrance Stairs-Rails: Right;Left;Can reach both Home Layout: One level     Bathroom Shower/Tub: Teacher, early years/pre: Standard Bathroom Accessibility: Yes How Accessible: Accessible via walker Home Equipment: (mother in law has DME)      Lives With: Spouse;Family    Prior Functioning/Environment Level of Independence: Independent        Comments: works transporting Mudlogger Problem List: Impaired balance (sitting and/or standing)      OT Treatment/Interventions:      OT Goals(Current goals can be found in the care plan section) Acute  Rehab OT Goals Patient Stated Goal: to go home OT Goal Formulation: All assessment and education complete, DC therapy  OT Frequency:     Barriers to D/C:            Co-evaluation              AM-PAC PT "6 Clicks" Daily Activity     Outcome Measure Help from another person eating meals?: None Help from another person taking care of personal grooming?: None Help from another person toileting, which includes using toliet, bedpan, or urinal?: None Help from another person bathing (including washing, rinsing, drying)?: None Help from another person to put on and taking off regular upper body clothing?: None Help from another person to put on and taking off regular lower body clothing?: None 6 Click Score: 24   End of Session Nurse Communication: Mobility  status  Activity Tolerance: Patient tolerated treatment well Patient left: in chair;with call bell/phone within reach  OT Visit Diagnosis: Dizziness and giddiness (R42)                Time: 1610-9604 OT Time Calculation (min): 19 min Charges:  OT General Charges $OT Visit: 1 Visit OT Evaluation $OT Eval Low Complexity: 1 Low G-Codes:     [place], OT/L  [phone] 04/12/2017  Gazelle Towe,HILLARY 04/12/2017, 11:48 AM

## 2017-04-12 NOTE — Evaluation (Addendum)
Physical Therapy Evaluation/ Discharge Patient Details Name: Randy Shepherd MRN: [id number] DOB: Sep 04, 1950 Today's Date: 04/12/2017   History of Present Illness   66 y.o. male with a history of HTN, HLD, stroke who presents with recurrent episodes of right sided weakness with basilar occlusion. Underwent IR 04/11/17.   Clinical Impression  Pt in chair on arrival and stated symptoms of dizziness and UE and face tingling and weakness initially without current residual deficits. Pt able to complete all basic transfers, gait and functional activities without dizziness or LOB. With strength strong and equal bil. Pt at baseline functional status without need for  Further therapy intervention at this time. Will sign off.      Follow Up Recommendations No PT follow up    Equipment Recommendations  None recommended by PT    Recommendations for Other Services       Precautions / Restrictions Precautions Precautions: None      Mobility  Bed Mobility Overal bed mobility: Modified Independent             General bed mobility comments: in chair on arrival   Transfers Overall transfer level: Independent                  Ambulation/Gait Ambulation/Gait assistance: Independent Ambulation Distance (Feet): 300 Feet Assistive device: None Gait Pattern/deviations: WFL(Within Functional Limits);Step-through pattern   Gait velocity interpretation: at or above normal speed for age/gender General Gait Details: pt able to complete head turns, change of direction and speed all without LOB  Stairs Stairs: Yes Stairs assistance: Independent Stair Management: Alternating pattern;Forwards Number of Stairs: 3    Wheelchair Mobility    Modified Rankin (Stroke Patients Only) Modified Rankin (Stroke Patients Only) Pre-Morbid Rankin Score: No symptoms Modified Rankin: No symptoms     Balance Overall balance assessment: No apparent balance deficits (not formally assessed)                                            Pertinent Vitals/Pain Pain Assessment: No/denies pain    Home Living Family/patient expects to be discharged to:: Private residence Living Arrangements: Spouse/significant other;Other relatives Available Help at Discharge: Family;Available PRN/intermittently(wife works) Type of Home: House Home Access: Stairs to enter Entrance Stairs-Rails: Right;Left;Can reach both Technical brewer of Steps: 3 Home Layout: One level Home Equipment: (mother in law has DME)      Prior Function Level of Independence: Independent         Comments: works transporting Marine scientist   Dominant Hand: Right    Extremity/Trunk Assessment   Upper Extremity Assessment Upper Extremity Assessment: Defer to OT evaluation    Lower Extremity Assessment Lower Extremity Assessment: Overall WFL for tasks assessed    Cervical / Trunk Assessment Cervical / Trunk Assessment: Normal  Communication   Communication: No difficulties  Cognition Arousal/Alertness: Awake/alert Behavior During Therapy: WFL for tasks assessed/performed Overall Cognitive Status: Within Functional Limits for tasks assessed                                        General Comments      Exercises     Assessment/Plan    PT Assessment Patent does not need any further PT services  PT Problem List  PT Treatment Interventions      PT Goals (Current goals can be found in the Care Plan section)  Acute Rehab PT Goals Patient Stated Goal: to go home PT Goal Formulation: All assessment and education complete, DC therapy    Frequency     Barriers to discharge        Co-evaluation               AM-PAC PT "6 Clicks" Daily Activity  Outcome Measure Difficulty turning over in bed (including adjusting bedclothes, sheets and blankets)?: None Difficulty moving from lying on back to sitting on the side of the bed? :  None Difficulty sitting down on and standing up from a chair with arms (e.g., wheelchair, bedside commode, etc,.)?: None Help needed moving to and from a bed to chair (including a wheelchair)?: None Help needed walking in hospital room?: None Help needed climbing 3-5 steps with a railing? : None 6 Click Score: 24    End of Session Equipment Utilized During Treatment: Gait belt Activity Tolerance: Patient tolerated treatment well Patient left: in chair;with call bell/phone within reach Nurse Communication: Mobility status PT Visit Diagnosis: Other abnormalities of gait and mobility (R26.89)    Time: 4270-6237 PT Time Calculation (min) (ACUTE ONLY): 15 min   Charges:   PT Evaluation $PT Eval Low Complexity: 1 Low     PT G CodesElwyn Reach, PT [phone]   Romelo Sciandra B Analyce Tavares 04/12/2017, 1:20 PM

## 2017-04-12 NOTE — Evaluation (Signed)
Speech Language Pathology Evaluation Patient Details Name: Randy Shepherd MRN: [id number] DOB: July 28, 1950 Today's Date: 04/12/2017 Time: 8657- 0903     Problem List:  Patient Active Problem List   Diagnosis Date Noted  . Stroke (cerebrum) ([place]) 04/11/2017  . Cellulitis and abscess of leg 10/06/2012  . Hypertension 10/06/2012  . Hyperlipidemia 10/06/2012  . History of stroke 10/06/2012  . Thyroid dysfunction 10/06/2012   Past Medical History:  Past Medical History:  Diagnosis Date  . Abscess 10/07/2012   RT KNEE  . Degenerative disc disease   . Depression   . DJD (degenerative joint disease)   . High cholesterol   . Hypertension   . Stroke ([place])   . Thyroid disease    Past Surgical History:  Past Surgical History:  Procedure Laterality Date  . APPENDECTOMY     HPI:  66 year old male with rapid waxing/waning of severe stroke symptoms in the setting of basilar occlusion. Multiple (45) punctate foci of acute ischemia within the   Assessment / Plan / Recommendation Clinical Impression  MOCA administered with patient scoring 30/30, WFL for all subsets administered. No SLP f/u indicated. Education complete with patient and spouse.     SLP Assessment  SLP Recommendation/Assessment: Patient does not need any further Speech Lanaguage Pathology Services SLP Visit Diagnosis: Cognitive communication deficit (R41.841)    Follow Up Recommendations  None          SLP Evaluation Cognition  Overall Cognitive Status: Within Functional Limits for tasks assessed Orientation Level: Oriented X4       Comprehension  Auditory Comprehension Overall Auditory Comprehension: Appears within functional limits for tasks assessed Visual Recognition/Discrimination Discrimination: Within Function Limits Reading Comprehension Reading Status: Within funtional limits    Expression Expression Primary Mode of Expression: Verbal Verbal Expression Overall Verbal Expression: Appears  within functional limits for tasks assessed Written Expression Dominant Hand: Right   Oral / Motor  Oral Motor/Sensory Function Overall Oral Motor/Sensory Function: Within functional limits Motor Speech Overall Motor Speech: Appears within functional limits for tasks assessed   GO            Randy Rainwater MA, CCC-SLP [phone]          Randy Shepherd 04/12/2017, 9:11 AM

## 2017-04-12 NOTE — Research (Signed)
STROKE-AF Informed Consent   Subject Name: Randy Shepherd  Subject met inclusion and exclusion criteria.  The informed consent form, study requirements and expectations were reviewed with the subject and questions and concerns were addressed prior to the signing of the consent form.  The subject verbalized understanding of the trail requirements.  The subject agreed to participate in the STROKE-AF trial and signed the informed consent.  The informed consent was obtained prior to performance of any protocol-specific procedures for the subject.  A copy of the signed informed consent was given to the subject and a copy was placed in the subject's medical record. Randomized to Continuous Monitoring (REVEAL LINK)  Hedrick,Korban Shearer W 04/12/2017, 3:41 PM

## 2017-04-12 NOTE — Progress Notes (Addendum)
NEUROHOSPITALISTS STROKE TEAM - DAILY PROGRESS NOTE   ADMISSION HISTORY:  Randy Shepherd is a 66 y.o. male with a history of htn, hl, stroke who presents with recurrent episodes of right sided weakness that started last night. On awakening, he had difficulty getting out of bed and his right arm was numb. This improved, but he always had persistent numbness. On arrival to [facility], he had mild facial droop and right sided numbness, but no other symptoms. Following his CT, he developed sudden worsening of severe right sided weakness. 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   LKW: Last night, 7pm(when he went to bed) tpa given?: no, out of window  SUBJECTIVE (INTERVAL HISTORY) Wife is at the bedside. Patient is found laying in bed in NAD. Overall he feels his condition is gradually improving. Voices no new complaints. No new events reported overnight.  Patient states that he had multiple transient episodes at home of right-sided weakness, facial numbness, severe dysarthria, lasting 10-15 minutes prior to coming to the emergency room last evening.  He admits to having a stroke 14 years ago with no residual deficits.  Patient is requesting to be discharged home today.  OBJECTIVE Lab Results: CBC:  Recent Labs  Lab 04/11/17 0732 04/11/17 0741 04/12/17 0238  WBC 12.4*  --  14.8*  HGB 15.6 16.3 14.0  HCT 45.9 48.0 41.6  MCV 90.4  --  90.4  PLT 316  --  254   BMP: Recent Labs  Lab 04/11/17 0732 04/11/17 0741  NA 137 142  K 3.8 3.8  CL 103 104  CO2 24  --   GLUCOSE 155* 159*  BUN 12 13  CREATININE 1.18 1.00  CALCIUM 9.4  --    Liver Function Tests:  Recent Labs  Lab 04/11/17 0732  AST 49*  ALT 51  ALKPHOS 67  BILITOT 0.9  PROT 8.1  ALBUMIN 4.1   Coagulation Studies:  Recent Labs    04/11/17 0732  APTT 33  INR 1.04   Urinalysis:  Recent Labs  Lab 04/11/17 1051  COLORURINE STRAW*    APPEARANCEUR CLEAR  LABSPEC 1.040*  PHURINE 8.0  GLUCOSEU NEGATIVE  HGBUR NEGATIVE  BILIRUBINUR NEGATIVE  KETONESUR NEGATIVE  PROTEINUR 30*  NITRITE NEGATIVE  LEUKOCYTESUR NEGATIVE   Urine Drug Screen:     Component Value Date/Time   LABOPIA NONE DETECTED 04/11/2017 1051   COCAINSCRNUR NONE DETECTED 04/11/2017 1051   LABBENZ NONE DETECTED 04/11/2017 1051   AMPHETMU NONE DETECTED 04/11/2017 1051   THCU NONE DETECTED 04/11/2017 1051   LABBARB NONE DETECTED 04/11/2017 1051    Alcohol Level:  Recent Labs  Lab 04/11/17 0732  ETH <10   PHYSICAL EXAM Temp:  [97.5 F (36.4 C)-98.6 F (37 C)] 97.8 F (36.6 C) (12/13 1200) Pulse Rate:  [68-85] 68 (12/13 1300) Resp:  [12-22] 12 (12/13 1300) BP: (120-155)/(52-99) 147/77 (12/13 1300) SpO2:  [92 %-99 %] 99 % (12/13 1300) General - Well nourished, well developed, in no apparent distress Respiratory - Lungs clear bilaterally. No wheezing. Cardiovascular - Regular rate and rhythm  Neuro: Mental Status: Patient is awake, alert, oriented to person, place, month, year, and situation. Patient is able to give a clear and coherent history. No signs of aphasia or neglect Cranial Nerves: II: Visual Fields are full. Pupils are equal, round, and reactive to light.   III,IV, VI: EOMI without ptosis or diploplia.  V: Facial sensation is symmetric to temperature VII: Facial movement is symmetric.  VIII: hearing is intact to voice X: Uvula elevates symmetrically XI: Shoulder shrug is symmetric. XII: tongue is midline without atrophy or fasciculations.  Motor: Tone is normal. Bulk is normal. 5/5 strength was present in all four extremities.  Sensory: Sensation is decreased in the right arm.  Cerebellar: FNF  intact bilaterally  IMAGING: I have personally reviewed the radiological images below and agree with the radiology interpretations.  Ct Angio Head & Neck W Or Wo Contrast Result Date: 04/11/2017 IMPRESSION: 1. 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 2. The hypoplastic right vertebral artery terminates at the right PICA. 3. Small irregular basilar artery appears to fill from a small right P1 segment, in a retrograde fashion. 4. Fetal type posterior cerebral artery is bilaterally. 5. Mild diffuse small vessel disease and circle Willis without other significant proximal stenosis, aneurysm, or branch vessel occlusion.   Mr Brain Wo Contrast Result Date: 04/11/2017  IMPRESSION: 1. Multiple (45) punctate foci of acute ischemia within the cerebellum bilaterally. No other acute abnormality. 2. No hemorrhage or mass effect. 3. Normal appearance of the left vertebral artery flow void. Diminutive basilar artery. Electronically Signed   By: Ulyses Jarred M.D.   On: 04/11/2017 18:34   Ct Cerebral Perfusion W Contrast Result Date: 04/11/2017 IMPRESSION: Unremarkable CT perfusion exam. Electronically Signed   By: San Morelle M.D.   On: 04/11/2017 08:21   Dg Chest Portable 1 View Result Date: 04/11/2017 CLINICAL DATA:  Recent stroke EXAM: PORTABLE CHEST 1 VIEW COMPARISON:  None. FINDINGS: The heart size and mediastinal contours are within normal limits. Both lungs are clear. The visualized skeletal structures are unremarkable. IMPRESSION: No active disease. Electronically Signed   By: Inez Catalina M.D.   On: 04/11/2017 15:44   Ct Head Code Stroke Wo Contrast Result Date: 04/11/2017 IMPRESSION: 1. No acute or focal cortical abnormality. 2. Asymmetric hyperdense right M1 segment. This is discordant from the patient's symptoms. Large vessel occlusion is considered less likely. 3. Remote lacunar infarct of the left basal ganglia. 4. Atrophy and white matter changes are mildly advanced for age 11. ASPECTS is 10/10   Echocardiogram:  Study Conclusions - Left ventricle: The cavity size was normal. Wall thickness was   increased in a pattern of mild LVH. Systolic function was   vigorous. The estimated ejection fraction  was in the range of 65%   to 70%. Wall motion was normal; there were no regional wall   motion abnormalities. Doppler parameters are consistent with   abnormal left ventricular relaxation (grade 1 diastolic   dysfunction). The E/e&' ratio is >15, suggesting elevated LV   filling pressure. - Aortic valve: Sclerosis without stenosis. Mean gradient: 8 mmHg. - Left atrium: The atrium was normal in size. - Inferior vena cava: The vessel was normal in size. The   respirophasic diameter changes were in the normal range (>= 50%),   consistent with normal central venous pressure. Impressions: - LVEF 65-70%, mild LVH, normal wall motion, grade 1 DD, elevated   LV filling pressure, aortic valve sclerosis without stenosis,   normal LA size, normal IVC.     ASSESSMENT: [name] is a 66 y.o. male with PMH of HTN, HLD, and Hx of CVA 14 years ago who presents with rapid waxing/waning of severe stroke symptoms in the setting of basilar occlusion.  The chronicity of this lesion is unclear given that the last imaging we have is in 2005. CT head- unremarkable and CTA- basilar occlusion. IR Dr  Deveshwar performed emergent Cerebral Angiogram.  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 Remote lacunar infarct of the left basal ganglia  Suspected Etiology: large vessel occlusive disease Resultant Symptoms: right sided weakness, severe right hemi-facial weakness, severe dysarthria all resolved now Stroke Risk Factors: hyperlipidemia and hypertension Other Stroke Risk Factors: Advanced age, Obesity, Body mass index is 34.57 kg/m.  Hx stroke Patient participating in the stroke atrial fibrillation study and randomized to loop recorder insertion arm PROCEDURES: S/P 4 vessel cerebral arteriogram. RT CFA approach. Findings. 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 2.Approx 75 % stenosis of LT VBJ distal to  Lt PICA. 3.Approx 50  To 75 % stenosis of Lt MCA distal M1. 4.Retrograde flow via RT PCOM to origin of AICAs   Outstanding Stroke Work-up Studies:    Workup completed  04/12/2017: Patient's neuro exam is stable.  No focal weakness or numbness or dysarthria noted.  IV heparin in progress with plan to stop at 12 noon today.  Patient will be evaluated again by therapies as some mild ataxia was noted with walking this morning. Plan of care and long-term prognosis reviewed with patient and wife at bedside. All questions answered.  Patient will likely be discharged home in the morning.  PLAN  04/12/2017: IV Heparin to be discontinue at 12 noon today Transfer to 3W, likely to d/c home in AM Continue Aspirin/ Plavix/ Statin Frequent neuro checks Telemetry monitoring PT/OT/SLP Consult PM & Rehab Patient considering participation in Stroke - AFIB trial Ongoing aggressive stroke risk factor management Patient counseled to be compliant with his antithrombotic medications Patient counseled to avoid dehydration and recommended to moderate his alcohol intake Follow up with [facility] in 6 weeks Follow-up in the cardiovascular research clinic for stroke atrial fibrillation study follow-up as per study scheduled  HX OF STROKES: 14 years ago - Lacunar infarct of the left basal ganglia, no deficits  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, continue for 3 months and then Plavix alone  DYSPHAGIA: NPO until passes SLP swallow evaluation Aspiration Precautions in progress  LEUKOCYTOSIS Likely reactive to Angiogram Remains afebrile Will repeat labs in AM  HYPERTENSION: Stable, some elevated B/P's noted overnight SBP goals 120-140 Hydralazine PRN added to [facility] Restart home B/P medications today 12/13 Home Meds: Lotensin, Norvasc  HYPERLIPIDEMIA:    Component Value Date/Time   CHOL 169 04/12/2017 0238   TRIG 119 04/12/2017 0238   HDL 41 04/12/2017 0238   CHOLHDL 4.1 04/12/2017 0238     VLDL 24 04/12/2017 0238   LDLCALC 104 (H) 04/12/2017 0238  Home Meds:  Zocor 20 mg LDL  goal < 70 Continued on  Zocor 40 mg daily Continue statin at discharge  PRE-DIABETES: Lab Results  Component Value Date   HGBA1C 5.9 (H) 04/12/2017  HgbA1c goal < 7.0 Needs PCP f/u  OBESITY Obesity, Body mass index is 34.57 kg/m. Greater than/equal to 30  Other Active Problems: Active Problems:   Stroke (cerebrum) [facility])  Hospital day # 1 VTE prophylaxis: SCD's  Diet : Diet Heart Room service appropriate? Yes; Fluid consistency: Thin   FAMILY UPDATES: Wife at bedside  TEAM UPDATES: Garvin Fila, MD     Prior Home Stroke Medications:  No antithrombotic and Zocor 40 mg  Discharge Stroke Meds: Please discharge patient on aspirin 81 mg daily, clopidogrel 75 mg daily and Zocor 40 mg   Disposition: 01-Home or Self Care Therapy Recs:  NONE Home Equipment:   NONE Follow Up:  Follow-up Information    Garvin Fila, MD. Schedule an appointment as soon as possible for a visit in 6 week(s).   Specialties:  Neurology, Radiology Contact information: [address] [place] [address] [phone]          PCP at [facility] - Follow up in 1-2 weeks Follow-up with Dr. Estanislado Pandy in [place] weeks  Renie Ora Stroke Neurology Team 04/12/2017 3:25 PM I have personally examined this patient, reviewed notes, independently viewed imaging studies, participated in medical decision making and plan of care.ROS completed by me personally and pertinent positives fully documented  I have made any additions or clarifications directly to the above note. Agree with note above.  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 He is doing functionally quite well. Discontinue IV heparin drip and continued on dual antiplatelet therapy of aspirin and Plavix for 3 months  followed by Plavix alone. Aggressive risk factor modification. I had a long discussion the patient and his wife regarding the stroke risk factors, risk for recurrent strokes, need for risk factor modification and stroke prevention and answered questions. Patient has decided to participate in the stroke atrial fibrillation prior and was randomized after signing informed consent to the loop recorder arm. Greater than 50% time during this 35 minute visit was spent on counseling and coordination of care about his stroke, basilar occlusion, discussion about prevention plan and answered questions  Antony Contras, MD Medical Director [place] Pager: [phone] 04/12/2017 4:08 PM  To contact Stroke Continuity provider, please refer to [email or web address] After hours, contact General Neurology

## 2017-04-13 ENCOUNTER — Encounter (HOSPITAL_COMMUNITY)
Admission: EM | Disposition: A | Discharge status: Home / Self Care | Payer: Self-pay | Source: Home / Self Care | Attending: Neurology

## 2017-04-13 ENCOUNTER — Ambulatory Visit (HOSPITAL_COMMUNITY): Admission: RE | Admit: 2017-04-13 | Payer: Medicare Other | Source: Ambulatory Visit | Admitting: Internal Medicine

## 2017-04-13 ENCOUNTER — Inpatient Hospital Stay (HOSPITAL_COMMUNITY): Payer: Medicare Other

## 2017-04-13 ENCOUNTER — Encounter (HOSPITAL_COMMUNITY): Payer: Self-pay | Admitting: Internal Medicine

## 2017-04-13 DIAGNOSIS — I6300 Cerebral infarction due to thrombosis of unspecified precerebral artery: Secondary | ICD-10-CM

## 2017-04-13 DIAGNOSIS — I6389 Other cerebral infarction: Secondary | ICD-10-CM

## 2017-04-13 HISTORY — PX: LOOP RECORDER INSERTION: EP1214

## 2017-04-13 LAB — GLUCOSE, CAPILLARY
GLUCOSE-CAPILLARY: 90 mg/dL (ref 65–99)
Glucose-Capillary: 107 mg/dL — ABNORMAL HIGH (ref 65–99)
Glucose-Capillary: 116 mg/dL — ABNORMAL HIGH (ref 65–99)
Glucose-Capillary: 47 mg/dL — ABNORMAL LOW (ref 65–99)

## 2017-04-13 LAB — HEPARIN LEVEL (UNFRACTIONATED): Heparin Unfractionated: <0.1 IU/mL — ABNORMAL LOW (ref 0.30–0.70)

## 2017-04-13 LAB — CBC
HEMATOCRIT: 41.5 % (ref 39.0–52.0)
Hemoglobin: 14.1 g/dL (ref 13.0–17.0)
MCH: 30.4 pg (ref 26.0–34.0)
MCHC: 34 g/dL (ref 30.0–36.0)
MCV: 89.4 fL (ref 78.0–100.0)
PLATELETS: 274 10*3/uL (ref 150–400)
RBC: 4.64 MIL/uL (ref 4.22–5.81)
RDW: 14.1 % (ref 11.5–15.5)
WBC: 12.3 10*3/uL — AB (ref 4.0–10.5)

## 2017-04-13 SURGERY — LOOP RECORDER INSERTION

## 2017-04-13 MED ORDER — DEXTROSE 50 % IV SOLN
INTRAVENOUS | Status: AC
  Administered 2017-04-13: 14:00:00
  Filled 2017-04-13: qty 50

## 2017-04-13 MED ORDER — BENAZEPRIL HCL 20 MG PO TABS: 20.0000 mg | ORAL_TABLET | Freq: Two times a day (BID) | ORAL | 1 refills | Status: DC

## 2017-04-13 MED ORDER — SODIUM CHLORIDE 0.9 % IV SOLN
INTRAVENOUS | Status: AC
  Administered 2017-04-14: 04:00:00 via INTRAVENOUS

## 2017-04-13 MED ORDER — BENAZEPRIL HCL 20 MG PO TABS
20.0000 mg | ORAL_TABLET | Freq: Two times a day (BID) | ORAL | Status: DC
Start: 2017-04-13 — End: 2017-04-15
  Administered 2017-04-13 – 2017-04-15 (×5): 20 mg via ORAL
  Filled 2017-04-13 (×5): qty 1

## 2017-04-13 MED ORDER — ASPIRIN 81 MG PO TBEC: 81.0000 mg | DELAYED_RELEASE_TABLET | Freq: Every day | ORAL | 1 refills | Status: DC

## 2017-04-13 MED ORDER — HEPARIN (PORCINE) IN NACL 100-0.45 UNIT/ML-% IJ SOLN
1550.0000 [IU]/h | INTRAMUSCULAR | Status: AC
  Administered 2017-04-13 – 2017-04-15 (×2): 1550 [IU]/h via INTRAVENOUS
  Filled 2017-04-13 (×3): qty 250

## 2017-04-13 MED ORDER — LIDOCAINE-EPINEPHRINE 1 %-1:100000 IJ SOLN
INTRAMUSCULAR | Status: AC
Start: 2017-04-13 — End: 2017-04-13
  Filled 2017-04-13: qty 1

## 2017-04-13 MED ORDER — CLOPIDOGREL BISULFATE 75 MG PO TABS: 75.0000 mg | ORAL_TABLET | Freq: Every day | ORAL | 1 refills | Status: AC

## 2017-04-13 MED ORDER — LIDOCAINE-EPINEPHRINE 1 %-1:100000 IJ SOLN
INTRAMUSCULAR | Status: DC | PRN
  Administered 2017-04-13: 30 mL

## 2017-04-13 MED ORDER — HYDROCHLOROTHIAZIDE 12.5 MG PO CAPS
12.5000 mg | ORAL_CAPSULE | Freq: Every day | ORAL | Status: DC
  Administered 2017-04-13 – 2017-04-14 (×2): 12.5 mg via ORAL
  Filled 2017-04-13 (×2): qty 1

## 2017-04-13 MED ORDER — HYDROCHLOROTHIAZIDE 12.5 MG PO CAPS: 12.5000 mg | ORAL_CAPSULE | Freq: Every day | ORAL | 1 refills | Status: DC

## 2017-04-13 SURGICAL SUPPLY — 2 items
LOOP REVEAL LINQSYS (Prosthesis & Implant Heart) ×2 IMPLANT
PACK LOOP INSERTION (CUSTOM PROCEDURE TRAY) ×3 IMPLANT

## 2017-04-13 NOTE — Progress Notes (Signed)
Pt and wife had been given discharge instructions and patient signed signature on discharge papers. Volunteer appeared to transport patient for discharge and patient was unable to speak or move right side. Nurse notified. Patient placed back in the bed and Rapid response and neurology notified. VSS, except CBG was 47. IV restarted and amp of D50 given. Pt given NIHSS of 14. Symptoms began to resolve and neurology at bedside. Wife also back at bedside at this point. Placed back on telemetry and on 2L/[place] for comfort. Discharge cancelled and CT ordered for patient. Will continue to monitor pt status. Leanne Chang, RN

## 2017-04-13 NOTE — Progress Notes (Signed)
ANTICOAGULATION CONSULT NOTE   Pharmacy Consult: Heparin Indication: stroke  No Known Allergies  Patient Measurements: Height: 5\' 9"  (175.3 cm) Weight: 240 lb 15.4 oz (109.3 kg) IBW/kg (Calculated) : 70.7 Heparin Dosing Weight: 106 kg  Vital Signs: Temp: 98.2 F (36.8 C) (12/14 1319) Temp Source: Oral (12/14 1319) BP: 157/90 (12/14 1319) Pulse Rate: 78 (12/14 1319)  Labs: Recent Labs    04/11/17 0732 04/11/17 0741 04/11/17 1928 04/12/17 0238 04/13/17 0422  HGB 15.6 16.3  --  14.0 14.1  HCT 45.9 48.0  --  41.6 41.5  PLT 316  --   --  254 274  APTT 33  --   --   --   --   LABPROT 13.5  --   --   --   --   INR 1.04  --   --   --   --   HEPARINUNFRC  --   --  0.21* 0.38 <0.10*  CREATININE 1.18 1.00  --   --   --     Estimated Creatinine Clearance: 88.5 mL/min (by C-G formula based on SCr of 1 mg/dL).   Assessment: 73 YOM presented to the ED on 04/11/17 with right sided weakness and facial droop.  Patient was out of the tPA window and heparin was started.  Heparin was then discontinued with plan to discharge the patient.  He then had acute right-sided weakness, facial droop, and dysarthria.  CT negative for bleed and Neuro consulted Pharmacy to resume heparin.  Patient was previously therapeutic on 1550 units/hr.   Goal of Therapy:  Heparin level 0.3-0.5 units/ml Monitor platelets by anticoagulation protocol: Yes    Plan:  Continue heparin gtt at 1550 units/hr, no bolus per MD Check 6 hr heparin level Daily heparin level and CBC   Astra Gregg D. Mina Marble, PharmD, BCPS Pager:  [phone] 04/13/2017, 8:00 PM

## 2017-04-13 NOTE — Progress Notes (Signed)
Neurohospitalist On-call note  Received sign out for [facility] review and if negative to start heparin drip. CTH negative for bleed. IV heparin started (pharmacy consult placed)  -- Amie Portland, MD [facility] [phone] If 7pm to 7am, please call on call as listed on AMION.

## 2017-04-13 NOTE — Progress Notes (Signed)
NEUROHOSPITALISTS STROKE TEAM - DAILY PROGRESS NOTE   ADMISSION HISTORY:  Randy Shepherd is a 66 y.o. male with a history of htn, hl, stroke who presents with recurrent episodes of right sided weakness that started last night. On awakening, he had difficulty getting out of bed and his right arm was numb. This improved, but he always had persistent numbness. On arrival to [facility], he had mild facial droop and right sided numbness, but no other symptoms. Following his CT, he developed sudden worsening of severe right sided weakness. 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   LKW: Last night, 7pm(when he went to bed) tpa given?: no, out of window  SUBJECTIVE (INTERVAL HISTORY) Wife is at the bedside. Patient is found laying in bed in NAD. Overall he feels his condition is gradually improving. Voices no new complaints. No new events reported overnight.  Update: 1:40PM Patient had transient episodes right-sided weakness, facial numbness,dysarthria, lasting 10 minutes prior to discharge home.     OBJECTIVE Lab Results: CBC:  Recent Labs  Lab 04/11/17 0732 04/11/17 0741 04/12/17 0238 04/13/17 0422  WBC 12.4*  --  14.8* 12.3*  HGB 15.6 16.3 14.0 14.1  HCT 45.9 48.0 41.6 41.5  MCV 90.4  --  90.4 89.4  PLT 316  --  254 274   BMP: Recent Labs  Lab 04/11/17 0732 04/11/17 0741  NA 137 142  K 3.8 3.8  CL 103 104  CO2 24  --   GLUCOSE 155* 159*  BUN 12 13  CREATININE 1.18 1.00  CALCIUM 9.4  --    Liver Function Tests:  Recent Labs  Lab 04/11/17 0732  AST 49*  ALT 51  ALKPHOS 67  BILITOT 0.9  PROT 8.1  ALBUMIN 4.1   Coagulation Studies:  Recent Labs    04/11/17 0732  APTT 33  INR 1.04   Urinalysis:  Recent Labs  Lab 04/11/17 1051  COLORURINE STRAW*  APPEARANCEUR CLEAR  LABSPEC 1.040*  PHURINE 8.0  GLUCOSEU NEGATIVE  HGBUR NEGATIVE  BILIRUBINUR NEGATIVE  KETONESUR NEGATIVE  PROTEINUR  30*  NITRITE NEGATIVE  LEUKOCYTESUR NEGATIVE   Urine Drug Screen:     Component Value Date/Time   LABOPIA NONE DETECTED 04/11/2017 1051   COCAINSCRNUR NONE DETECTED 04/11/2017 1051   LABBENZ NONE DETECTED 04/11/2017 1051   AMPHETMU NONE DETECTED 04/11/2017 1051   THCU NONE DETECTED 04/11/2017 1051   LABBARB NONE DETECTED 04/11/2017 1051    Alcohol Level:  Recent Labs  Lab 04/11/17 0732  ETH <10   PHYSICAL EXAM Temp:  [98 F (36.7 C)-98.5 F (36.9 C)] 98.2 F (36.8 C) (12/14 1319) Pulse Rate:  [63-95] 78 (12/14 1319) Resp:  [18-20] 20 (12/14 1319) BP: (127-159)/(74-90) 157/90 (12/14 1319) SpO2:  [96 %-98 %] 97 % (12/14 1319) Weight:  [109.3 kg (240 lb 15.4 oz)] 109.3 kg (240 lb 15.4 oz) (12/14 0616) General - Well nourished, well developed, in no apparent distress Respiratory - Lungs clear bilaterally. No wheezing. Cardiovascular - Regular rate and rhythm  Neuro: Mental Status: Patient is awake, alert, oriented to person, place, month, year, and situation. Patient is able to give a clear and coherent history. No signs of aphasia or neglect Cranial Nerves: II: Visual Fields are full. Pupils are equal, round, and reactive to light.   III,IV, VI: EOMI without ptosis or diploplia.  V: Facial sensation is symmetric to temperature VII: Facial movement is symmetric.  VIII: hearing is intact to voice X: Uvula elevates  symmetrically XI: Shoulder shrug is symmetric. XII: tongue is midline without atrophy or fasciculations.  Motor: Tone is normal. Bulk is normal. 5/5 strength was present in all four extremities.  Sensory: Sensation is decreased in the right arm.  Cerebellar: FNF  intact bilaterally  IMAGING: I have personally reviewed the radiological images below and agree with the radiology interpretations.  Ct Angio Head & Neck W Or Wo Contrast Result Date: 04/11/2017 IMPRESSION: 1. 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 2. The hypoplastic right  vertebral artery terminates at the right PICA. 3. Small irregular basilar artery appears to fill from a small right P1 segment, in a retrograde fashion. 4. Fetal type posterior cerebral artery is bilaterally. 5. Mild diffuse small vessel disease and circle Willis without other significant proximal stenosis, aneurysm, or branch vessel occlusion.   Mr Brain Wo Contrast Result Date: 04/11/2017  IMPRESSION: 1. Multiple (45) punctate foci of acute ischemia within the cerebellum bilaterally. No other acute abnormality. 2. No hemorrhage or mass effect. 3. Normal appearance of the left vertebral artery flow void. Diminutive basilar artery. Electronically Signed   By: Ulyses Jarred M.D.   On: 04/11/2017 18:34   Ct Cerebral Perfusion W Contrast Result Date: 04/11/2017 IMPRESSION: Unremarkable CT perfusion exam. Electronically Signed   By: San Morelle M.D.   On: 04/11/2017 08:21   Dg Chest Portable 1 View Result Date: 04/11/2017 CLINICAL DATA:  Recent stroke EXAM: PORTABLE CHEST 1 VIEW COMPARISON:  None. FINDINGS: The heart size and mediastinal contours are within normal limits. Both lungs are clear. The visualized skeletal structures are unremarkable. IMPRESSION: No active disease. Electronically Signed   By: Inez Catalina M.D.   On: 04/11/2017 15:44   Ct Head Code Stroke Wo Contrast Result Date: 04/11/2017 IMPRESSION: 1. No acute or focal cortical abnormality. 2. Asymmetric hyperdense right M1 segment. This is discordant from the patient's symptoms. Large vessel occlusion is considered less likely. 3. Remote lacunar infarct of the left basal ganglia. 4. Atrophy and white matter changes are mildly advanced for age 8. ASPECTS is 10/10   Echocardiogram:  Study Conclusions - Left ventricle: The cavity size was normal. Wall thickness was   increased in a pattern of mild LVH. Systolic function was   vigorous. The estimated ejection fraction was in the range of 65%   to 70%. Wall motion was normal;  there were no regional wall   motion abnormalities. Doppler parameters are consistent with   abnormal left ventricular relaxation (grade 1 diastolic   dysfunction). The E/e&' ratio is >15, suggesting elevated LV   filling pressure. - Aortic valve: Sclerosis without stenosis. Mean gradient: 8 mmHg. - Left atrium: The atrium was normal in size. - Inferior vena cava: The vessel was normal in size. The   respirophasic diameter changes were in the normal range (>= 50%),   consistent with normal central venous pressure. Impressions: - LVEF 65-70%, mild LVH, normal wall motion, grade 1 DD, elevated   LV filling pressure, aortic valve sclerosis without stenosis,   normal LA size, normal IVC.     ASSESSMENT: Mr. BRENYN PETREY is a 66 y.o. male with PMH of HTN, HLD, and Hx of CVA 14 years ago who presents with rapid waxing/waning of severe stroke symptoms in the setting of basilar occlusion.  The chronicity of this lesion is unclear given that the last imaging we have is in 2005. CT head- unremarkable and CTA- basilar occlusion. IR Dr Estanislado Pandy performed emergent Cerebral Angiogram.  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 Remote lacunar infarct of the left basal ganglia  Suspected Etiology: large vessel occlusive disease Resultant Symptoms: right sided weakness, severe right hemi-facial weakness, severe dysarthria all resolved now Stroke Risk Factors: hyperlipidemia and hypertension Other Stroke Risk Factors: Advanced age, Obesity, Body mass index is 35.58 kg/m.  Hx stroke Patient participating in the stroke atrial fibrillation study and randomized to loop recorder insertion arm PROCEDURES: S/P 4 vessel cerebral arteriogram. RT CFA approach. Findings. 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 3.Approx 50  To 75 % stenosis of Lt MCA distal  M1. 4.Retrograde flow via RT PCOM to origin of AICAs   Outstanding Stroke Work-up Studies:    Workup completed 04/12/2017:Patient's neuro exam is stable. No focal weakness or numbness or dysarthria noted. IV heparin in progress with plan to stop at 12 noon today. Patient will be evaluated again bytherapies as some mild ataxia was noted with walking this morning. Plan of care and long-term prognosis reviewed with patient and wife at bedside. All questions answered. Patient will likely be discharged homein the morning.  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 He is doing functionally quite well. Discontinue IV heparin drip and continued on dual antiplatelet therapy of aspirin and Plavix for 3 months followed by Plavix alone. Aggressive risk factor modification. I had a long discussion the patient and his wife regarding the stroke risk factors, risk for recurrent strokes, need for risk factor modification and stroke prevention and answered questions. Patient has decided to participate in the stroke atrial fibrillation prior and was randomized after signing informed consent to the loop recorder arm  04/13/17: Neuro exam stable.  Patient had loop recorder placed yesterday.  Some mildly elevated blood pressures overnight.  Have added HCTZ 12.5 mg daily in divided Lotensin dose to twice daily.  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  He will need close follow-up with his PCP to manage his blood pressure and prediabetes.  He will be discharged home on aspirin Plavix and Zocor and will follow-up in the neurology clinic in 6 weeks.  He will follow-up with the stroke A. fib trial in approximately 6 months.  Update: 1:40PM Called by nursing for acute onset Right sided weakness, facial droop and dysarthria. B/P 157/90 and Blood sugar  documented at 47. Patient put back into bed- flat, IV restarted, glucose IVP given and cardiac monitor put back on. Episode lasted approximately 10 minutes and patient returned to baseline. Patient examined by myself and Dr Leonie Man at bedside. Will continue to monitor closely and put discharge on hold for now. If symptoms reoccur will obtain STAT Head CT and begin IV Heparin if no bleed noted on imaging  PLAN  04/13/2017: Likely to d/c home in AM STAT Head CT if Neuro exam worsens Continue Aspirin/ Plavix/ Statin Frequent neuro checks Telemetry monitoring PT/OT/SLP Patient considering participation in Stroke - AFIB trial Ongoing aggressive stroke risk factor management Patient counseled to be compliant with his antithrombotic medications Patient counseled to avoid dehydration and recommended to moderate his alcohol intake Follow up with [facility] in 6 weeks Follow-up in the cardiovascular research clinic for stroke atrial fibrillation study follow-up as per study scheduled  HX OF  STROKES: 14 years ago - Lacunar infarct of the left basal ganglia, no deficits  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, continue for 3 months and then Plavix alone  DYSPHAGIA: Passed -SLP swallow evaluation Aspiration Precautions in progress  LEUKOCYTOSIS Likely reactive to Angiogram Remains afebrile  HYPERTENSION: Stable, some elevated B/P's noted overnight SBP goals 120-140 Low dose HCTZ 12.5 mg added 12/14 Split Lotensin dose to BID 12/14 Restarted home B/P medications today 12/13 Home Meds: Lotensin, Norvasc  HYPERLIPIDEMIA:    Component Value Date/Time   CHOL 169 04/12/2017 0238   TRIG 119 04/12/2017 0238   HDL 41 04/12/2017 0238   CHOLHDL 4.1 04/12/2017 0238   VLDL 24 04/12/2017 0238   LDLCALC 104 (H) 04/12/2017 0238  Home Meds:  Zocor 20 mg LDL  goal < 70 Continued on  Zocor 40 mg daily Continue statin at discharge  PRE-DIABETES: Lab Results  Component Value Date   HGBA1C  5.9 (H) 04/12/2017  HgbA1c goal < 7.0 Needs PCP f/u  OBESITY Obesity, Body mass index is 35.58 kg/m. Greater than/equal to 30  Other Active Problems: Active Problems:   Stroke (cerebrum) [facility])  Hospital day # 2 VTE prophylaxis: SCD's  Diet : Diet Heart Room service appropriate? Yes; Fluid consistency: Thin   FAMILY UPDATES: Wife at bedside  TEAM UPDATES: Garvin Fila, MD     Prior Home Stroke Medications:  No antithrombotic and Zocor 40 mg  Discharge Stroke Meds: Please discharge patient on aspirin 81 mg daily, clopidogrel 75 mg daily and Zocor 40 mg   Disposition: 01-Home or Self Care Therapy Recs:        NONE Home Equipment:   NONE Follow Up:  Follow-up Information    Garvin Fila, MD. Schedule an appointment as soon as possible for a visit in 6 week(s).   Specialties:  Neurology, Radiology Contact information: [address] [place] [address] [place] an appointment as soon as possible for a visit in 2 week(s).   Specialty:  General Practice Contact information: [address] [place] [address] [phone]        Luanne Bras, MD. Schedule an appointment as soon as possible for a visit in 6 week(s).   Specialties:  Interventional Radiology, Radiology Contact information: [address] [place] [address] [phone]          Renie Ora Stroke Neurology Team 04/13/2017 2:25 PM I have personally examined this patient, reviewed notes, independently viewed imaging studies, participated in medical decision making and plan of care.ROS completed by me personally and pertinent positives fully documented  I have made any additions or clarifications directly to the above note. Agree with note above.  Unfortunately the patient has had a recurrent TIA just when he was about to be discharged home.  Recommend bedrest today and IV hydration.  If symptoms do not  improve may require a repeat CT scan of the head prior to being started on IV heparin overnight.  Unfortunately patient is not a candidate for elective basilar stenting at this time given his recent stroke and recurrent symptoms.  Discussed with patient and wife and answered questions.  Greater than 50% time during this 35-minute visit was spent on counseling and coordination of care about his TIA and basilar occlusion and answering questions  Antony Contras, [place] Pager: [phone] 04/13/2017 6:09 PM  To contact Stroke Continuity provider, please  refer to [email or web address] After hours, contact General Neurology

## 2017-04-13 NOTE — Discharge Summary (Signed)
Stroke Discharge Summary  Patient ID: Randy Shepherd   MRN: [id number]      DOB: [birth year]  Date of Admission: 04/11/2017 Date of Discharge: 04/13/2017  Attending Physician:  Garvin Fila, MD, Stroke MD Consultant(s):   Treatment Team:  Stroke, Md, MD Interventional Radiology  Patient's PCP:  Patient, No Pcp Per - [facility]  DISCHARGE DIAGNOSIS:  Active Problems:   Stroke (cerebrum) ([place]) Intracranial Stenosis Dysphagia Leukocytosis Hypertension Hyperlipidemia Pre-diabetes Obesity  Past Medical History:  Diagnosis Date  . Abscess 10/07/2012   RT KNEE  . Degenerative disc disease   . Depression   . DJD (degenerative joint disease)   . High cholesterol   . Hypertension   . Stroke ([place])   . Thyroid disease    Past Surgical History:  Procedure Laterality Date  . APPENDECTOMY    . IR ANGIO INTRA EXTRACRAN SEL COM CAROTID INNOMINATE BILAT MOD SED  04/11/2017  . IR ANGIO VERTEBRAL SEL SUBCLAVIAN INNOMINATE UNI R MOD SED  04/11/2017  . IR ANGIO VERTEBRAL SEL VERTEBRAL UNI L MOD SED  04/11/2017  . LOOP RECORDER INSERTION N/A 04/13/2017   Procedure: LOOP RECORDER INSERTION;  Surgeon: Thompson Grayer, MD;  Location: [facility];  Service: Cardiovascular;  Laterality: N/A;  . RADIOLOGY WITH ANESTHESIA N/A 04/11/2017   Procedure: IR WITH ANESTHESIA;  Surgeon: Luanne Bras, MD;  Location: [facility];  Service: Radiology;  Laterality: N/A;    Allergies as of 04/13/2017   No Known Allergies     Medication List    TAKE these medications   amLODipine 10 MG tablet Commonly known as:  NORVASC Take 10 mg by mouth daily.   ARIPiprazole 5 MG tablet Commonly known as:  ABILIFY Take 5 mg by mouth daily.   aspirin 81 MG EC tablet Take 1 tablet (81 mg total) by mouth daily. Start taking on:  04/14/2017   benazepril 20 MG tablet Commonly known as:  LOTENSIN Take 1 tablet (20 mg total) by mouth 2 (two) times daily. What changed:    medication strength  how  much to take  when to take this   buPROPion 150 MG 12 hr tablet Commonly known as:  WELLBUTRIN SR Take 150 mg by mouth 2 (two) times daily.   clopidogrel 75 MG tablet Commonly known as:  PLAVIX Take 1 tablet (75 mg total) by mouth daily. Start taking on:  04/14/2017   gabapentin 300 MG capsule Commonly known as:  NEURONTIN Take 300-600 mg by mouth See admin instructions. Take 300 mg by mouth four times daily, then take 600 mg by mouth at bedtime.   hydrochlorothiazide 12.5 MG capsule Commonly known as:  MICROZIDE Take 1 capsule (12.5 mg total) by mouth daily. Start taking on:  04/14/2017   levothyroxine 200 MCG tablet Commonly known as:  SYNTHROID, LEVOTHROID Take 200 mcg by mouth daily before breakfast.   oxyCODONE 15 MG immediate release tablet Commonly known as:  ROXICODONE Take 15 mg by mouth every 6 (six) hours as needed for pain.   sertraline 100 MG tablet Commonly known as:  ZOLOFT Take 200 mg by mouth every morning.   simvastatin 40 MG tablet Commonly known as:  ZOCOR Take 20 mg by mouth every evening.      LABORATORY STUDIES CBC    Component Value Date/Time   WBC 12.3 (H) 04/13/2017 0422   RBC 4.64 04/13/2017 0422   HGB 14.1 04/13/2017 0422   HCT 41.5 04/13/2017 0422  PLT 274 04/13/2017 0422   MCV 89.4 04/13/2017 0422   MCH 30.4 04/13/2017 0422   MCHC 34.0 04/13/2017 0422   RDW 14.1 04/13/2017 0422   LYMPHSABS 3.5 04/11/2017 0732   MONOABS 0.9 04/11/2017 0732   EOSABS 0.3 04/11/2017 0732   BASOSABS 0.0 04/11/2017 0732   CMP    Component Value Date/Time   NA 142 04/11/2017 0741   K 3.8 04/11/2017 0741   CL 104 04/11/2017 0741   CO2 24 04/11/2017 0732   GLUCOSE 159 (H) 04/11/2017 0741   BUN 13 04/11/2017 0741   CREATININE 1.00 04/11/2017 0741   CALCIUM 9.4 04/11/2017 0732   PROT 8.1 04/11/2017 0732   ALBUMIN 4.1 04/11/2017 0732   AST 49 (H) 04/11/2017 0732   ALT 51 04/11/2017 0732   ALKPHOS 67 04/11/2017 0732   BILITOT 0.9 04/11/2017  0732   GFRNONAA >60 04/11/2017 0732   GFRAA >60 04/11/2017 0732   COAGS Lab Results  Component Value Date   INR 1.04 04/11/2017   Lipid Panel    Component Value Date/Time   CHOL 169 04/12/2017 0238   TRIG 119 04/12/2017 0238   HDL 41 04/12/2017 0238   CHOLHDL 4.1 04/12/2017 0238   VLDL 24 04/12/2017 0238   LDLCALC 104 (H) 04/12/2017 0238   HgbA1C  Lab Results  Component Value Date   HGBA1C 5.9 (H) 04/12/2017   Urinalysis    Component Value Date/Time   COLORURINE STRAW (A) 04/11/2017 1051   APPEARANCEUR CLEAR 04/11/2017 1051   LABSPEC 1.040 (H) 04/11/2017 1051   PHURINE 8.0 04/11/2017 1051   GLUCOSEU NEGATIVE 04/11/2017 1051   HGBUR NEGATIVE 04/11/2017 1051   BILIRUBINUR NEGATIVE 04/11/2017 1051   KETONESUR NEGATIVE 04/11/2017 1051   PROTEINUR 30 (A) 04/11/2017 1051   NITRITE NEGATIVE 04/11/2017 1051   LEUKOCYTESUR NEGATIVE 04/11/2017 1051   Urine Drug Screen     Component Value Date/Time   LABOPIA NONE DETECTED 04/11/2017 1051   COCAINSCRNUR NONE DETECTED 04/11/2017 1051   LABBENZ NONE DETECTED 04/11/2017 1051   AMPHETMU NONE DETECTED 04/11/2017 1051   THCU NONE DETECTED 04/11/2017 1051   LABBARB NONE DETECTED 04/11/2017 1051    Alcohol Level    Component Value Date/Time   ETH <10 04/11/2017 0732   SIGNIFICANT DIAGNOSTIC STUDIES Ct Angio Head & Neck W Or Wo Contrast Result Date: 04/11/2017 IMPRESSION: 1. 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 2. The hypoplastic right vertebral artery terminates at the right PICA. 3. Small irregular basilar artery appears to fill from a small right P1 segment, in a retrograde fashion. 4. Fetal type posterior cerebral artery is bilaterally. 5. Mild diffuse small vessel disease and circle Willis without other significant proximal stenosis, aneurysm, or branch vessel occlusion.   Mr Brain Wo Contrast Result Date: 04/11/2017  IMPRESSION: 1. Multiple (45) punctate foci of acute ischemia within the cerebellum  bilaterally. No other acute abnormality. 2. No hemorrhage or mass effect. 3. Normal appearance of the left vertebral artery flow void. Diminutive basilar artery. Electronically Signed   By: Ulyses Jarred M.D.   On: 04/11/2017 18:34   Ct Cerebral Perfusion W Contrast Result Date: 04/11/2017 IMPRESSION: Unremarkable CT perfusion exam. Electronically Signed   By: San Morelle M.D.   On: 04/11/2017 08:21   Dg Chest Portable 1 View Result Date: 04/11/2017 CLINICAL DATA:  Recent stroke EXAM: PORTABLE CHEST 1 VIEW COMPARISON:  None. FINDINGS: The heart size and mediastinal contours are within normal limits. Both lungs are clear. The visualized skeletal structures  are unremarkable. IMPRESSION: No active disease. Electronically Signed   By: Inez Catalina M.D.   On: 04/11/2017 15:44   Ct Head Code Stroke Wo Contrast Result Date: 04/11/2017 IMPRESSION: 1. No acute or focal cortical abnormality. 2. Asymmetric hyperdense right M1 segment. This is discordant from the patient's symptoms. Large vessel occlusion is considered less likely. 3. Remote lacunar infarct of the left basal ganglia. 4. Atrophy and white matter changes are mildly advanced for age 34. ASPECTS is 10/10   Echocardiogram:  Study Conclusions - Left ventricle: The cavity size was normal. Wall thickness was increased in a pattern of mild LVH. Systolic function was vigorous. The estimated ejection fraction was in the range of 65% to 70%. Wall motion was normal; there were no regional wall motion abnormalities. Doppler parameters are consistent with abnormal left ventricular relaxation (grade 1 diastolic dysfunction). The E/e&' ratio is >15, suggesting elevated LV filling pressure. - Aortic valve: Sclerosis without stenosis. Mean gradient: 8 mmHg. - Left atrium: The atrium was normal in size. - Inferior vena cava: The vessel was normal in size. The respirophasic diameter changes were in the normal range (>=  50%), consistent with normal central venous pressure. Impressions: - LVEF 65-70%, mild LVH, normal wall motion, grade 1 DD, elevated LV filling pressure, aortic valve sclerosis without stenosis, normal LA size, normal IVC.    HISTORY OF [place] COURSE ASSESSMENT: Randy Shepherd is a 66 y.o. male with PMH of HTN, HLD, and Hx of CVA 14 years ago who presents with rapid waxing/waning of severe stroke symptoms in the setting of basilar occlusion. The chronicity of this lesion is unclear given that the last imaging we have is in 2005. CT head- unremarkable and CTA- basilar occlusion. IR Dr Estanislado Pandy performed emergent Cerebral Angiogram.  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 Remote lacunar infarct of the left basal ganglia  Suspected Etiology: large vessel occlusive disease Resultant Symptoms: right sided weakness, severe right hemi-facial weakness, severe dysarthria all resolved now Stroke Risk Factors: hyperlipidemia and hypertension Other Stroke Risk Factors: Advanced age, Obesity, Body mass index is 34.57 kg/m.  Hx stroke Patient participating in the stroke atrial fibrillation study and randomized to loop recorder insertion arm PROCEDURES: S/P 4 vessel cerebral arteriogram. RT CFA approach. Findings. 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 3.Approx 50 To 75 % stenosis of Lt MCA distal M1. 4.Retrograde flow via RT PCOM to origin of AICAs  Outstanding Stroke Work-up Studies:    Workup completed  04/12/2017: Patient's neuro exam is stable.  No focal weakness or numbness or dysarthria noted.  IV heparin in progress with plan to stop at 12 noon today.  Patient will be evaluated again by therapies as some mild ataxia was noted with walking this morning. Plan of care and long-term prognosis reviewed  with patient and wife at bedside. All questions answered.  Patient will likely be discharged home in the morning.  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 He is doing functionally quite well. Discontinue IV heparin drip and continued on dual antiplatelet therapy of aspirin and Plavix for 3 months followed by Plavix alone. Aggressive risk factor modification. I had a long discussion the patient and his wife regarding the stroke risk factors, risk for recurrent strokes,  need for risk factor modification and stroke prevention and answered questions. Patient has decided to participate in the stroke atrial fibrillation prior and was randomized after signing informed consent to the loop recorder arm  04/13/17: Neuro exam stable.  Patient had loop recorder placed yesterday.  Some mildly elevated blood pressures overnight.  Have added HCTZ 12.5 mg daily in divided Lotensin dose to twice daily.  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  He will need close follow-up with his PCP to manage his blood pressure and prediabetes.  He will be discharged home on aspirin Plavix and Zocor and will follow-up in the neurology clinic in 6 weeks.  He will follow-up with the stroke A. fib trial in approximately 6 months.   PLAN  04/12/2017: Discharge home today Continue Aspirin/ Plavix/ Statin Patient considering participation in Stroke - AFIB trial Ongoing aggressive stroke risk factor management Patient counseled to be compliant withhisantithrombotic medications Patient counseled to avoid dehydration and recommended to moderate his alcohol intake Follow up with [facility] in 6 weeks Follow-up in the cardiovascular research clinic for stroke atrial fibrillation study follow-up as per study scheduled  HX OF STROKES: 14 years ago -  Lacunar infarct of the left basal ganglia, no deficits  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, continue for 3 months and then Plavix alone  DYSPHAGIA: Passed  LEUKOCYTOSIS- Resolved Likely reactive to Angiogram Remains afebrile  HYPERTENSION: Stable, some elevated B/P's noted overnight SBP goals 120-140 Low dose HCTZ 12.5 mg added 12/14 Split Lotensin dose to BID 12/14 Restart home B/P medications today 12/13 Home Meds: Lotensin, Norvasc PCP to monitor  HYPERLIPIDEMIA: Labs(Brief)          Component Value Date/Time   CHOL 169 04/12/2017 0238   TRIG 119 04/12/2017 0238   HDL 41 04/12/2017 0238   CHOLHDL 4.1 04/12/2017 0238   VLDL 24 04/12/2017 0238   LDLCALC 104 (H) 04/12/2017 0238    Home Meds:  Zocor 20 mg LDL  goal < 70 Continued on  Zocor 40 mg daily Continue statin at discharge  PRE-DIABETES: RecentLabs  Lab Results  Component Value Date   HGBA1C 5.9 (H) 04/12/2017    HgbA1c goal < 7.0 Needs PCP f/u  OBESITY Obesity, Body mass index is 34.57 kg/m. Greater than/equal to 30  DISCHARGE EXAM Blood pressure (!) 155/78, pulse 81, temperature 98.3 F (36.8 C), temperature source Oral, resp. rate 20, height 5\' 9"  (1.753 m), weight 109.3 kg (240 lb 15.4 oz), SpO2 98 %. General - Well nourished, well developed, in no apparent distress Respiratory - Lungs clear bilaterally. No wheezing. Cardiovascular - Regular rate and rhythm  Neuro: Mental Status: Patient is awake, alert, oriented to person, place, month, year, and situation. Patient is able to give a clear and coherent history. No signs of aphasia or neglect Cranial Nerves: II: Visual Fields are full. Pupils are equal, round, and reactive to light.  III,IV, VI: EOMI without ptosis or diploplia.  V: Facial sensation is symmetric to temperature VII: Facial movement is symmetric.  VIII: hearing is intact to voice X: Uvula elevates symmetrically XI: Shoulder shrug is symmetric. XII: tongue is  midline without atrophy or fasciculations.  Motor: Tone is normal. Bulk is normal. 5/5 strength was present in all four extremities.  Sensory: Sensation is decreased in the right arm.  Cerebellar: FNF intact bilaterally  Discharge Diet   Diet Heart Room service appropriate? Yes; Fluid consistency: Thin  liquids  DISCHARGE PLAN  Disposition:  HOME Prior Home Stroke Medications:  No antithrombotic and Zocor 40 mg  Discharge Stroke Meds:  Please discharge patient on ASA 81 mg daily, clopidogrel 75 mg daily & Zocor 40 mg   Disposition: 01-Home or Self Care Therapy Recs:       NONE Home Equipment:   NONE Follow Up:     Follow-up Information    Garvin Fila, MD. Schedule an appointment as soon as possible for a visit in 6 week(s).   Specialties:  Neurology, Radiology Contact information: [address] [place] [address] [phone]          PCP at [facility] - Follow up in 1-2 weeks Follow-up with Dr. Estanislado Pandy in 6 weeks  Renie Ora Stroke Neurology Team 04/13/2017 11:48 PM  Greater than 30 minutes were spent preparing discharge.  I have personally examined this patient, reviewed notes, independently viewed imaging studies, participated in medical decision making and plan of care.ROS completed by me personally and pertinent positives fully documented  I have made any additions or clarifications directly to the above note. Agree with note above.   Antony Contras, MD Medical Director [facility] Pager: [phone] 04/16/2017 5:30 PM

## 2017-04-13 NOTE — Interval H&P Note (Signed)
History and Physical Interval Note:  04/13/2017 8:42 AM  [name]  has presented today for surgery, with the diagnosis of stroke study  The various methods of treatment have been discussed with the patient and family. After consideration of risks, benefits and other options for treatment, the patient has consented to  Procedure(s): LOOP RECORDER INSERTION (N/A) as a surgical intervention .  The patient's history has been reviewed, patient examined, no change in status, stable for surgery.  I have reviewed the patient's chart and labs.  Questions were answered to the patient's satisfaction.     Randy Shepherd

## 2017-04-13 NOTE — Care Management Note (Signed)
Case Management Note  Patient Details  Name: Randy Shepherd MRN: [id number] Date of Birth: 1950/07/18  Subjective/Objective:   Pt admitted with stroke. He is from home with his spouse.                  Action/Plan: No f/u or DME per PT/OT. Pt discharging home with self care. Pt states he has a PCP through [place] at the [facility]. He cant remember the MD's name.  Wife to provide transportation home and intermittent supervision at home.   Expected Discharge Date:  04/13/17               Expected Discharge Plan:  Home/Self Care  In-House Referral:     Discharge planning Services  CM Consult  Post Acute Care Choice:    Choice offered to:     DME Arranged:    DME Agency:     HH Arranged:    HH Agency:     Status of Service:  Completed, signed off  If discussed at [employer] of Stay Meetings, dates discussed:    Additional Comments:  Pollie Friar, RN 04/13/2017, 11:55 AM

## 2017-04-13 NOTE — Progress Notes (Addendum)
Called by nursing-patient intermittently complaining of dizziness.  Patient examined no dysarthria, no facial droop and no right-sided weakness.  Patient states he intermittently is having ringing in his ears and the room begins to spin.  He denies any headache or blurred vision, no nausea.  He states this occurs every 15-30 minutes or so since his acute onset of right-sided weakness dysarthria and right facial droop at approximately 1:45 PM Nursing states patient's blood sugar has come up to into the 100s.  She is not sure of the exact number.  His blood pressure has been in the 140s-150s.  He has been in normal sinus rhythm per report.  Case discussed with Dr. Leonie Man.  Will obtain stat head CT and if negative for bleeding will start IV heparin overnight.  Have given phone report to Dr. Kathrynn Speed who will review the head CT this evening.  Will continue to monitor closely.  Renie Ora Neurology Stroke Team  I agree with above plan as stated Antony Contras, MD Medical Director [place] Pager: [phone] 04/14/2017 11:24 AM

## 2017-04-13 NOTE — Consult Note (Signed)
ELECTROPHYSIOLOGY CONSULT NOTE  Patient ID: Randy Shepherd MRN: [id number], DOB/AGE: July 12, 1950   Admit date: 04/11/2017 Date of Consult: 04/13/2017  Primary Physician: Patient, No Pcp Per Primary Cardiologist: new to Heart Care Reason for Consultation: Cryptogenic stroke; recommendations regarding Implantable Loop Recorder  History of Present Illness EP has been asked to evaluate [name] for placement of an implantable loop recorder to monitor for atrial fibrillation by Dr Leonie Man.  The patient was admitted on 04/11/2017 with recurrent right sided weakness.  They first developed symptoms while at home the night before admission.  Symptoms waxed and waned.  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  He has been enrolled in the Stroke AF study and randomized to implantable loop recorder.  Echocardiogram this admission demonstrated EF 13-24%, grade 1 diastolic dysfunction, no RWMA, LA normal in size.  Lab work is reviewed.  Prior to admission, the patient denies chest pain, shortness of breath, dizziness, palpitations, or syncope.    Past Medical History:  Diagnosis Date  . Abscess 10/07/2012   RT KNEE  . Degenerative disc disease   . Depression   . DJD (degenerative joint disease)   . High cholesterol   . Hypertension   . Stroke ([place])   . Thyroid disease      Surgical History:  Past Surgical History:  Procedure Laterality Date  . APPENDECTOMY    . RADIOLOGY WITH ANESTHESIA N/A 04/11/2017   Procedure: IR WITH ANESTHESIA;  Surgeon: Luanne Bras, MD;  Location: [place];  Service: Radiology;  Laterality: N/A;     Medications Prior to Admission  Medication Sig Dispense Refill Last Dose  . amLODipine (NORVASC) 10 MG tablet Take 10 mg by mouth daily.    07/31/2013 at Unknown time  . ARIPiprazole (ABILIFY) 5 MG tablet Take 5 mg by mouth daily.     .  benazepril (LOTENSIN) 40 MG tablet Take 40 mg by mouth daily.   07/31/2013 at Unknown time  . buPROPion (WELLBUTRIN SR) 150 MG 12 hr tablet Take 150 mg by mouth 2 (two) times daily.     Marland Kitchen gabapentin (NEURONTIN) 300 MG capsule Take 300-600 mg by mouth See admin instructions. Take 300 mg by mouth four times daily, then take 600 mg by mouth at bedtime.   07/31/2013 at Unknown time  . levothyroxine (SYNTHROID, LEVOTHROID) 200 MCG tablet Take 200 mcg by mouth daily before breakfast.    07/31/2013 at Unknown time  . oxyCODONE (ROXICODONE) 15 MG immediate release tablet Take 15 mg by mouth every 6 (six) hours as needed for pain.     Marland Kitchen sertraline (ZOLOFT) 100 MG tablet Take 200 mg by mouth every morning.     . simvastatin (ZOCOR) 40 MG tablet Take 20 mg by mouth every evening.   07/30/2013 at Unknown time    Inpatient Medications:  . amLODipine  10 mg Oral Daily  . ARIPiprazole  5 mg Oral Daily  . aspirin EC  81 mg Oral Daily  . atorvastatin  20 mg Oral q1800  . benazepril  40 mg Oral Daily  . buPROPion  150 mg Oral BID  . clopidogrel  75 mg Oral Daily  . levothyroxine  200 mcg Oral QAC breakfast  . sertraline  200 mg Oral Daily    Allergies: No Known Allergies  Social History   Socioeconomic History  . Marital status: Married    Spouse name: Not on  file  . Number of children: Not on file  . Years of education: Not on file  . Highest education level: Not on file  Social Needs  . Financial resource strain: Not on file  . Food insecurity - worry: Not on file  . Food insecurity - inability: Not on file  . Transportation needs - medical: Not on file  . Transportation needs - non-medical: Not on file  Occupational History  . Not on file  Tobacco Use  . Smoking status: Former Research scientist (life sciences)  . Smokeless tobacco: Never Used  Substance and Sexual Activity  . Alcohol use: Yes    Alcohol/week: 1.8 oz    Types: 3 Cans of beer per week    Comment: DAILY BEER  . Drug use: No  . Sexual activity: Not on  file  Other Topics Concern  . Not on file  Social History Narrative  . Not on file     Family History: no prior stroke history     Review of Systems: All other systems reviewed and are otherwise negative except as noted above.  Physical Exam: Vitals:   04/12/17 2100 04/13/17 0100 04/13/17 0400 04/13/17 0616  BP: (!) 155/74 127/75 (!) 151/77   Pulse: 95 70 63   Resp: 18 18 18    Temp: 98.4 F (36.9 C) 98.5 F (36.9 C) 98 F (36.7 C)   TempSrc: Oral Oral Oral   SpO2: 98% 98% 96%   Weight:    240 lb 15.4 oz (109.3 kg)  Height:        GEN- The patient is well appearing, alert and oriented x 3 today.   Head- normocephalic, atraumatic Eyes-  Sclera clear, conjunctiva pink Ears- hearing intact Oropharynx- clear Neck- supple Lungs- Clear to ausculation bilaterally, normal work of breathing Heart- Regular rate and rhythm, no murmurs, rubs or gallops  GI- soft, NT, ND, + BS Extremities- no clubbing, cyanosis, or edema MS- no significant deformity or atrophy Skin- no rash or lesion Psych- euthymic mood, full affect   Labs:   Lab Results  Component Value Date   WBC 12.3 (H) 04/13/2017   HGB 14.1 04/13/2017   HCT 41.5 04/13/2017   MCV 89.4 04/13/2017   PLT 274 04/13/2017    Recent Labs  Lab 04/11/17 0732 04/11/17 0741  NA 137 142  K 3.8 3.8  CL 103 104  CO2 24  --   BUN 12 13  CREATININE 1.18 1.00  CALCIUM 9.4  --   PROT 8.1  --   BILITOT 0.9  --   ALKPHOS 67  --   ALT 51  --   AST 49*  --   GLUCOSE 155* 159*     Radiology/Studies: Ct Angio Head W Or Wo Contrast Result Date: 04/11/2017 CLINICAL DATA:  Slurred speech beginning last evening. Numbness and tingling to the upper extremities. Right-sided weakness. EXAM: CT ANGIOGRAPHY HEAD AND NECK TECHNIQUE: Multidetector CT imaging of the head and neck was performed using the standard protocol during bolus administration of intravenous contrast. Multiplanar CT image reconstructions and MIPs were obtained to  evaluate the vascular anatomy. Carotid stenosis measurements (when applicable) are obtained utilizing NASCET criteria, using the distal internal carotid diameter as the denominator. CONTRAST:  161mL ISOVUE-370 IOPAMIDOL (ISOVUE-370) INJECTION 76% COMPARISON:  None. CT head without contrast from the same day. CT profusion study from the same day. MRI brain and MRA head 08/26/2003 FINDINGS: CTA NECK FINDINGS Aortic arch: A 3 vessel arch configuration is present. There is no  significant stenosis of the great vessel origins. Minimal atherosclerotic calcifications are present along the underside of the arch. Right carotid system: The right common carotid artery is within normal limits. Atherosclerotic changes are present at the right carotid bifurcation without significant stenosis relative to the more distal vessel. The cervical right ICA is within normal limits. Left carotid system: Tortuosity is present in the proximal left common carotid artery without significant stenosis. The the left common carotid artery is otherwise within normal limits. Atherosclerotic irregularity is present at the left carotid bifurcation without a significant stenosis relative to the more distal vessel. Vertebral arteries: The vertebral artery is originate from the subclavian artery is bilaterally without significant stenosis at the origins. The left vertebral artery is dominant. The right vertebral artery is hypoplastic. There is occlusion of the left vertebral artery just proximal to the vertebrobasilar junction. The hypoplastic right vertebral artery terminates at the PICA. Skeleton: Grade 1 anterolisthesis is present at C7-T1 associated with advanced facet hypertrophy bilaterally. Endplate changes and uncovertebral disease are most noted at C5-6 and C6-7. Facet hypertrophy is worse on the right C3-4. Vertebral body heights are maintained. The patient is edentulous. Other neck: No focal mucosal or submucosal lesions are present. The  thyroid is mildly heterogeneous without a dominant lesion. There is no significant adenopathy. Salivary glands are within normal limits. Upper chest: Mild dependent atelectasis is present at the lung apices bilaterally. Review of the MIP images confirms the above findings CTA HEAD FINDINGS Anterior circulation: Atherosclerotic calcifications are present within the cavernous internal carotid artery is bilaterally. There is no hyperdense vessel. Fetal type posterior cerebral arteries are present bilaterally. The internal carotid arteries are of normal caliber through the ICA termini. Both ACA vessels fill from the right. The left A1 segment is aplastic. The M1 segments are within normal limits bilaterally. The MCA bifurcations are within normal limits bilaterally. ACA and MCA branch vessels are within normal limits. Posterior circulation: Proximal basilar artery is occluded. There is no flow from the dural segment of the dominant left vertebral artery through the proximal basilar segment. There is marked irregularity through the remainder of the basilar artery which appears to fill via a small right P1 segment and a retrograde fashion. Both superior cerebellar arteries are visualized. AICA vessels are noted bilaterally. There is narrowing of the distal left P2 segment just proximal to the bifurcation. PCA branch vessels are otherwise within normal limits bilaterally. Venous sinuses: The dural sinuses are patent. Transverse sinuses are codominant. The straight sinus and deep cerebral veins are within normal limits bilaterally. Cortical veins are within normal limits. Anatomic variants: Fetal type posterior cerebral artery ease and aplastic left A1 segment as previously seen. Delayed phase: Not performed Review of the MIP images confirms the above findings IMPRESSION: 1. 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 2. The hypoplastic right vertebral artery terminates at the right PICA. 3. Small irregular  basilar artery appears to fill from a small right P1 segment, in a retrograde fashion. 4. Fetal type posterior cerebral artery is bilaterally. 5. Mild diffuse small vessel disease and circle Willis without other significant proximal stenosis, aneurysm, or branch vessel occlusion. These results were called by telephone at the time of interpretation on 04/11/2017 at 8:28 am to Dr. Roland Rack , who verbally acknowledged these results. Electronically Signed   By: San Morelle M.D.   On: 04/11/2017 08:37   12-lead ECG sinus rhythm (personally reviewed) All prior EKG's in EPIC reviewed with no documented atrial fibrillation  Telemetry sinus rhythm (personally reviewed)  Assessment and Plan:  1. 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  Risks, benefits, and alteratives to implantable loop recorder were discussed with the patient today.   At this time, the patient is very clear in their decision to proceed with implantable loop recorder.   Wound care was reviewed with the patient (keep incision clean and dry for 3 days).  Wound check scheduled and entered in AVS.  Please call with questions.   Chanetta Marshall, NP 04/13/2017 7:15 AM  I have seen, examined the patient, and reviewed the above assessment and plan.  Changes to above are made where necessary.  On exam, RRR.  He has consented to Stroke AF study.  Risks and benefits of ILR implantation to monitor for atrial fibrillation were discussed in detail with the patient who wishes to proceed at this time.  Co Sign: Thompson Grayer, MD 04/13/2017 8:40 AM

## 2017-04-13 NOTE — Progress Notes (Signed)
Called by nursing for acute onset Right sided weakness, facial droop and dysarthria. B/P 157/90 and Blood sugar documented at 47. Patient put back into bed- flat, IV restarted, glucose IVP given and cardiac monitor put back on. Episode lasted approximately 10 minutes and patient returned to baseline. Patient examined by myself and Dr Leonie Man at bedside. Will continue to monitor closely and put discharge on hold for now.  Randy Shepherd, ANP-C Neurology Stroke Team  I agree with above plan as stated. Antony Contras. MD

## 2017-04-13 NOTE — H&P (View-Only) (Signed)
ELECTROPHYSIOLOGY CONSULT NOTE  Patient ID: Randy Shepherd MRN: [id number], DOB/AGE: 10-14-1950   Admit date: 04/11/2017 Date of Consult: 04/13/2017  Primary Physician: Patient, No Pcp Per Primary Cardiologist: new to Heart Care Reason for Consultation: Cryptogenic stroke; recommendations regarding Implantable Loop Recorder  History of Present Illness EP has been asked to evaluate Randy Shepherd for placement of an implantable loop recorder to monitor for atrial fibrillation by Dr Leonie Man.  The patient was admitted on 04/11/2017 with recurrent right sided weakness.  They first developed symptoms while at home the night before admission.  Symptoms waxed and waned.  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  He has been enrolled in the Stroke AF study and randomized to implantable loop recorder.  Echocardiogram this admission demonstrated EF 29-79%, grade 1 diastolic dysfunction, no RWMA, LA normal in size.  Lab work is reviewed.  Prior to admission, the patient denies chest pain, shortness of breath, dizziness, palpitations, or syncope.    Past Medical History:  Diagnosis Date  . Abscess 10/07/2012   RT KNEE  . Degenerative disc disease   . Depression   . DJD (degenerative joint disease)   . High cholesterol   . Hypertension   . Stroke ([place])   . Thyroid disease      Surgical History:  Past Surgical History:  Procedure Laterality Date  . APPENDECTOMY    . RADIOLOGY WITH ANESTHESIA N/A 04/11/2017   Procedure: IR WITH ANESTHESIA;  Surgeon: Luanne Bras, MD;  Location: [place];  Service: Radiology;  Laterality: N/A;     Medications Prior to Admission  Medication Sig Dispense Refill Last Dose  . amLODipine (NORVASC) 10 MG tablet Take 10 mg by mouth daily.    07/31/2013 at Unknown time  . ARIPiprazole (ABILIFY) 5 MG tablet Take 5 mg by mouth daily.     .  benazepril (LOTENSIN) 40 MG tablet Take 40 mg by mouth daily.   07/31/2013 at Unknown time  . buPROPion (WELLBUTRIN SR) 150 MG 12 hr tablet Take 150 mg by mouth 2 (two) times daily.     Marland Kitchen gabapentin (NEURONTIN) 300 MG capsule Take 300-600 mg by mouth See admin instructions. Take 300 mg by mouth four times daily, then take 600 mg by mouth at bedtime.   07/31/2013 at Unknown time  . levothyroxine (SYNTHROID, LEVOTHROID) 200 MCG tablet Take 200 mcg by mouth daily before breakfast.    07/31/2013 at Unknown time  . oxyCODONE (ROXICODONE) 15 MG immediate release tablet Take 15 mg by mouth every 6 (six) hours as needed for pain.     Marland Kitchen sertraline (ZOLOFT) 100 MG tablet Take 200 mg by mouth every morning.     . simvastatin (ZOCOR) 40 MG tablet Take 20 mg by mouth every evening.   07/30/2013 at Unknown time    Inpatient Medications:  . amLODipine  10 mg Oral Daily  . ARIPiprazole  5 mg Oral Daily  . aspirin EC  81 mg Oral Daily  . atorvastatin  20 mg Oral q1800  . benazepril  40 mg Oral Daily  . buPROPion  150 mg Oral BID  . clopidogrel  75 mg Oral Daily  . levothyroxine  200 mcg Oral QAC breakfast  . sertraline  200 mg Oral Daily    Allergies: No Known Allergies  Social History   Socioeconomic History  . Marital status: Married    Spouse name: Not on  file  . Number of children: Not on file  . Years of education: Not on file  . Highest education level: Not on file  Social Needs  . Financial resource strain: Not on file  . Food insecurity - worry: Not on file  . Food insecurity - inability: Not on file  . Transportation needs - medical: Not on file  . Transportation needs - non-medical: Not on file  Occupational History  . Not on file  Tobacco Use  . Smoking status: Former Research scientist (life sciences)  . Smokeless tobacco: Never Used  Substance and Sexual Activity  . Alcohol use: Yes    Alcohol/week: 1.8 oz    Types: 3 Cans of beer per week    Comment: DAILY BEER  . Drug use: No  . Sexual activity: Not on  file  Other Topics Concern  . Not on file  Social History Narrative  . Not on file     Family History: no prior stroke history     Review of Systems: All other systems reviewed and are otherwise negative except as noted above.  Physical Exam: Vitals:   04/12/17 2100 04/13/17 0100 04/13/17 0400 04/13/17 0616  BP: (!) 155/74 127/75 (!) 151/77   Pulse: 95 70 63   Resp: 18 18 18    Temp: 98.4 F (36.9 C) 98.5 F (36.9 C) 98 F (36.7 C)   TempSrc: Oral Oral Oral   SpO2: 98% 98% 96%   Weight:    240 lb 15.4 oz (109.3 kg)  Height:        GEN- The patient is well appearing, alert and oriented x 3 today.   Head- normocephalic, atraumatic Eyes-  Sclera clear, conjunctiva pink Ears- hearing intact Oropharynx- clear Neck- supple Lungs- Clear to ausculation bilaterally, normal work of breathing Heart- Regular rate and rhythm, no murmurs, rubs or gallops  GI- soft, NT, ND, + BS Extremities- no clubbing, cyanosis, or edema MS- no significant deformity or atrophy Skin- no rash or lesion Psych- euthymic mood, full affect   Labs:   Lab Results  Component Value Date   WBC 12.3 (H) 04/13/2017   HGB 14.1 04/13/2017   HCT 41.5 04/13/2017   MCV 89.4 04/13/2017   PLT 274 04/13/2017    Recent Labs  Lab 04/11/17 0732 04/11/17 0741  NA 137 142  K 3.8 3.8  CL 103 104  CO2 24  --   BUN 12 13  CREATININE 1.18 1.00  CALCIUM 9.4  --   PROT 8.1  --   BILITOT 0.9  --   ALKPHOS 67  --   ALT 51  --   AST 49*  --   GLUCOSE 155* 159*     Radiology/Studies: Ct Angio Head W Or Wo Contrast Result Date: 04/11/2017 CLINICAL DATA:  Slurred speech beginning last evening. Numbness and tingling to the upper extremities. Right-sided weakness. EXAM: CT ANGIOGRAPHY HEAD AND NECK TECHNIQUE: Multidetector CT imaging of the head and neck was performed using the standard protocol during bolus administration of intravenous contrast. Multiplanar CT image reconstructions and MIPs were obtained to  evaluate the vascular anatomy. Carotid stenosis measurements (when applicable) are obtained utilizing NASCET criteria, using the distal internal carotid diameter as the denominator. CONTRAST:  134mL ISOVUE-370 IOPAMIDOL (ISOVUE-370) INJECTION 76% COMPARISON:  None. CT head without contrast from the same day. CT profusion study from the same day. MRI brain and MRA head 08/26/2003 FINDINGS: CTA NECK FINDINGS Aortic arch: A 3 vessel arch configuration is present. There is no  significant stenosis of the great vessel origins. Minimal atherosclerotic calcifications are present along the underside of the arch. Right carotid system: The right common carotid artery is within normal limits. Atherosclerotic changes are present at the right carotid bifurcation without significant stenosis relative to the more distal vessel. The cervical right ICA is within normal limits. Left carotid system: Tortuosity is present in the proximal left common carotid artery without significant stenosis. The the left common carotid artery is otherwise within normal limits. Atherosclerotic irregularity is present at the left carotid bifurcation without a significant stenosis relative to the more distal vessel. Vertebral arteries: The vertebral artery is originate from the subclavian artery is bilaterally without significant stenosis at the origins. The left vertebral artery is dominant. The right vertebral artery is hypoplastic. There is occlusion of the left vertebral artery just proximal to the vertebrobasilar junction. The hypoplastic right vertebral artery terminates at the PICA. Skeleton: Grade 1 anterolisthesis is present at C7-T1 associated with advanced facet hypertrophy bilaterally. Endplate changes and uncovertebral disease are most noted at C5-6 and C6-7. Facet hypertrophy is worse on the right C3-4. Vertebral body heights are maintained. The patient is edentulous. Other neck: No focal mucosal or submucosal lesions are present. The  thyroid is mildly heterogeneous without a dominant lesion. There is no significant adenopathy. Salivary glands are within normal limits. Upper chest: Mild dependent atelectasis is present at the lung apices bilaterally. Review of the MIP images confirms the above findings CTA HEAD FINDINGS Anterior circulation: Atherosclerotic calcifications are present within the cavernous internal carotid artery is bilaterally. There is no hyperdense vessel. Fetal type posterior cerebral arteries are present bilaterally. The internal carotid arteries are of normal caliber through the ICA termini. Both ACA vessels fill from the right. The left A1 segment is aplastic. The M1 segments are within normal limits bilaterally. The MCA bifurcations are within normal limits bilaterally. ACA and MCA branch vessels are within normal limits. Posterior circulation: Proximal basilar artery is occluded. There is no flow from the dural segment of the dominant left vertebral artery through the proximal basilar segment. There is marked irregularity through the remainder of the basilar artery which appears to fill via a small right P1 segment and a retrograde fashion. Both superior cerebellar arteries are visualized. AICA vessels are noted bilaterally. There is narrowing of the distal left P2 segment just proximal to the bifurcation. PCA branch vessels are otherwise within normal limits bilaterally. Venous sinuses: The dural sinuses are patent. Transverse sinuses are codominant. The straight sinus and deep cerebral veins are within normal limits bilaterally. Cortical veins are within normal limits. Anatomic variants: Fetal type posterior cerebral artery ease and aplastic left A1 segment as previously seen. Delayed phase: Not performed Review of the MIP images confirms the above findings IMPRESSION: 1. 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 2. The hypoplastic right vertebral artery terminates at the right PICA. 3. Small irregular  basilar artery appears to fill from a small right P1 segment, in a retrograde fashion. 4. Fetal type posterior cerebral artery is bilaterally. 5. Mild diffuse small vessel disease and circle Willis without other significant proximal stenosis, aneurysm, or branch vessel occlusion. These results were called by telephone at the time of interpretation on 04/11/2017 at 8:28 am to Dr. Roland Rack , who verbally acknowledged these results. Electronically Signed   By: San Morelle M.D.   On: 04/11/2017 08:37   12-lead ECG sinus rhythm (personally reviewed) All prior EKG's in EPIC reviewed with no documented atrial fibrillation  Telemetry sinus rhythm (personally reviewed)  Assessment and Plan:  1. 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  Risks, benefits, and alteratives to implantable loop recorder were discussed with the patient today.   At this time, the patient is very clear in their decision to proceed with implantable loop recorder.   Wound care was reviewed with the patient (keep incision clean and dry for 3 days).  Wound check scheduled and entered in AVS.  Please call with questions.   Chanetta Marshall, NP 04/13/2017 7:15 AM  I have seen, examined the patient, and reviewed the above assessment and plan.  Changes to above are made where necessary.  On exam, RRR.  He has consented to Stroke AF study.  Risks and benefits of ILR implantation to monitor for atrial fibrillation were discussed in detail with the patient who wishes to proceed at this time.  Co Sign: Thompson Grayer, MD 04/13/2017 8:40 AM

## 2017-04-13 NOTE — Progress Notes (Signed)
Pt being discharged home via wheelchair with family. Pt alert and oriented x4. VSS. Pt c/o no pain at this time. No signs of respiratory distress. Education complete and care plans resolved. IV removed with catheter intact and pt tolerated well. No further issues at this time. Pt to follow up with PCP. Jani Moronta R, RN 

## 2017-04-14 DIAGNOSIS — I10 Essential (primary) hypertension: Secondary | ICD-10-CM

## 2017-04-14 DIAGNOSIS — I651 Occlusion and stenosis of basilar artery: Secondary | ICD-10-CM

## 2017-04-14 DIAGNOSIS — I6322 Cerebral infarction due to unspecified occlusion or stenosis of basilar arteries: Principal | ICD-10-CM

## 2017-04-14 DIAGNOSIS — E785 Hyperlipidemia, unspecified: Secondary | ICD-10-CM

## 2017-04-14 LAB — GLUCOSE, CAPILLARY
GLUCOSE-CAPILLARY: 113 mg/dL — AB (ref 65–99)
GLUCOSE-CAPILLARY: 78 mg/dL (ref 65–99)
GLUCOSE-CAPILLARY: 86 mg/dL (ref 65–99)
Glucose-Capillary: 111 mg/dL — ABNORMAL HIGH (ref 65–99)
Glucose-Capillary: 76 mg/dL (ref 65–99)

## 2017-04-14 LAB — HEPARIN LEVEL (UNFRACTIONATED)
HEPARIN UNFRACTIONATED: 0.31 [IU]/mL (ref 0.30–0.70)
HEPARIN UNFRACTIONATED: 0.34 [IU]/mL (ref 0.30–0.70)

## 2017-04-14 LAB — CBC
HCT: 41.9 % (ref 39.0–52.0)
HEMOGLOBIN: 14.3 g/dL (ref 13.0–17.0)
MCH: 30.6 pg (ref 26.0–34.0)
MCHC: 34.1 g/dL (ref 30.0–36.0)
MCV: 89.7 fL (ref 78.0–100.0)
PLATELETS: 277 10*3/uL (ref 150–400)
RBC: 4.67 MIL/uL (ref 4.22–5.81)
RDW: 14 % (ref 11.5–15.5)
WBC: 15 10*3/uL — AB (ref 4.0–10.5)

## 2017-04-14 MED ORDER — ASPIRIN EC 325 MG PO TBEC
325.0000 mg | DELAYED_RELEASE_TABLET | Freq: Every day | ORAL | Status: DC
  Administered 2017-04-15: 325 mg via ORAL
  Filled 2017-04-14: qty 1

## 2017-04-14 NOTE — Progress Notes (Signed)
ANTICOAGULATION CONSULT NOTE   Pharmacy Consult: Heparin Indication: stroke  No Known Allergies  Patient Measurements: Height: 5\' 9"  (175.3 cm) Weight: 240 lb 15.4 oz (109.3 kg) IBW/kg (Calculated) : 70.7 Heparin Dosing Weight: 106 kg  Vital Signs: Temp: 98 F (36.7 C) (12/15 1020) Temp Source: Oral (12/15 0607) BP: 132/70 (12/15 1020) Pulse Rate: 70 (12/15 1020)  Labs: Recent Labs    04/12/17 0238 04/13/17 0422 04/14/17 0334 04/14/17 0929  HGB 14.0 14.1 14.3  --   HCT 41.6 41.5 41.9  --   PLT 254 274 277  --   HEPARINUNFRC 0.38 <0.10* 0.31 0.34   Estimated Creatinine Clearance: 88.5 mL/min (by C-G formula based on SCr of 1 mg/dL).  Assessment: 39 YOM presented to the ED on 04/11/17 with right sided weakness and facial droop. Previously on heparin this admission and discontinued. Heparin resumed per neuro due to recurrent stroke symptoms (CT neg for bleed). Patient was previously therapeutic on 1550 units/hr.  Heparin level therapeutic at 0.35 and CBC stable.   Goal of Therapy:  Heparin level 0.3-0.5 units/ml Monitor platelets by anticoagulation protocol: Yes   Plan:  Continue heparin gtt at 1550 units/hr Daily heparin level and CBC Monitor for s/s bleeding   Charlene Brooke, PharmD PGY1 Pharmacy Resident Phone: [phone] After 3:30PM please call [facility] [phone] 04/14/17 10:51 AM

## 2017-04-14 NOTE — Progress Notes (Signed)
ANTICOAGULATION CONSULT NOTE   Pharmacy Consult: Heparin Indication: stroke  No Known Allergies  Patient Measurements: Height: 5\' 9"  (175.3 cm) Weight: 240 lb 15.4 oz (109.3 kg) IBW/kg (Calculated) : 70.7 Heparin Dosing Weight: 106 kg  Vital Signs: Temp: 98.4 F (36.9 C) (12/15 0532) Temp Source: Oral (12/15 0532) BP: 145/84 (12/15 0532) Pulse Rate: 66 (12/15 0532)  Labs: Recent Labs    04/11/17 0732 04/11/17 0741  04/12/17 0238 04/13/17 0422 04/14/17 0334  HGB 15.6 16.3  --  14.0 14.1 14.3  HCT 45.9 48.0  --  41.6 41.5 41.9  PLT 316  --   --  254 274 277  APTT 33  --   --   --   --   --   LABPROT 13.5  --   --   --   --   --   INR 1.04  --   --   --   --   --   HEPARINUNFRC  --   --    < > 0.38 <0.10* 0.31  CREATININE 1.18 1.00  --   --   --   --    < > = values in this interval not displayed.   Estimated Creatinine Clearance: 88.5 mL/min (by C-G formula based on SCr of 1 mg/dL).  Assessment: 15 YOM presented to the ED on 04/11/17 with right sided weakness and facial droop. Previously on heparin this admission and discontinued. Heparin resumed per neuro due to recurrent stroke symptoms (CT neg for bleed). Patient was previously therapeutic on 1550 units/hr.  Heparin level therapeutic at 0.31 and CBC stable.   Goal of Therapy:  Heparin level 0.3-0.5 units/ml Monitor platelets by anticoagulation protocol: Yes   Plan:  Continue heparin gtt at 1550 units/hr Confirm heparin level in 6 hrs Daily heparin level and CBC Monitor for s/s bleeding  Lavonda Jumbo, PharmD Clinical Pharmacist 04/14/17 5:58 AM

## 2017-04-14 NOTE — Progress Notes (Addendum)
NEUROHOSPITALISTS STROKE TEAM - DAILY PROGRESS NOTE   SUBJECTIVE (INTERVAL HISTORY) Wife is at the bedside. Yesterday afternoon patient had transient episodes right-sided weakness, facial numbness,dysarthria, lasting 10 minutes prior to discharge home. He stated that he got up and stood up for discharging home before that happened. Glucose 47, received D50. BP checked later was 157/90. Put on heparin drip after CT negative. Today pt lying in bed, no symptoms, asking for going home. BP 120s, stopped HCTZ and orthostatic vital unremarkable. Continue IVF till tonight and heparin IV tomorrow. If remains symptoms free, will d/c in am with ASA 325mg  and plavix.   OBJECTIVE Lab Results: CBC:  Recent Labs  Lab 04/12/17 0238 04/13/17 0422 04/14/17 0334  WBC 14.8* 12.3* 15.0*  HGB 14.0 14.1 14.3  HCT 41.6 41.5 41.9  MCV 90.4 89.4 89.7  PLT 254 274 277   BMP: Recent Labs  Lab 04/11/17 0732 04/11/17 0741  NA 137 142  K 3.8 3.8  CL 103 104  CO2 24  --   GLUCOSE 155* 159*  BUN 12 13  CREATININE 1.18 1.00  CALCIUM 9.4  --    Liver Function Tests:  Recent Labs  Lab 04/11/17 0732  AST 49*  ALT 51  ALKPHOS 67  BILITOT 0.9  PROT 8.1  ALBUMIN 4.1   Coagulation Studies:  No results for input(s): APTT, INR in the last 72 hours. Urinalysis:  Recent Labs  Lab 04/11/17 1051  COLORURINE STRAW*  APPEARANCEUR CLEAR  LABSPEC 1.040*  PHURINE 8.0  GLUCOSEU NEGATIVE  HGBUR NEGATIVE  BILIRUBINUR NEGATIVE  KETONESUR NEGATIVE  PROTEINUR 30*  NITRITE NEGATIVE  LEUKOCYTESUR NEGATIVE   Urine Drug Screen:     Component Value Date/Time   LABOPIA NONE DETECTED 04/11/2017 1051   COCAINSCRNUR NONE DETECTED 04/11/2017 1051   LABBENZ NONE DETECTED 04/11/2017 1051   AMPHETMU NONE DETECTED 04/11/2017 1051   THCU NONE DETECTED 04/11/2017 1051   LABBARB NONE DETECTED 04/11/2017 1051    Alcohol Level:  Recent Labs  Lab 04/11/17 0732    ETH <10   PHYSICAL EXAM Temp:  [98.2 F (36.8 C)-98.9 F (37.2 C)] 98.9 F (37.2 C) (12/15 0607) Pulse Rate:  [66-90] 90 (12/15 0607) Resp:  [16-20] 16 (12/15 0607) BP: (125-157)/(69-90) 125/69 (12/15 0607) SpO2:  [97 %-100 %] 100 % (12/15 0607) General - Well nourished, well developed, in no apparent distress Respiratory - Lungs clear bilaterally. No wheezing. Cardiovascular - Regular rate and rhythm  Neuro: Mental Status: Patient is awake, alert, oriented to person, place, month, year, and situation. Patient is able to give a clear and coherent history. No signs of aphasia or neglect Cranial Nerves: II: Visual Fields are full. Pupils are equal, round, and reactive to light.   III,IV, VI: EOMI without ptosis or diploplia.  V: Facial sensation is symmetric to temperature VII: Facial movement is symmetric.  VIII: hearing is intact to voice X: Uvula elevates symmetrically XI: Shoulder shrug is symmetric. XII: tongue is midline without atrophy or fasciculations.  Motor: Tone is normal. Bulk is normal. 5/5 strength was present in all four extremities.  Sensory: Sensation is decreased in the right arm.  Cerebellar: FNF  intact bilaterally   IMAGING I have personally reviewed the radiological images below and agree with the radiology interpretations.  CT Head Without Contrast 04/13/2017 IMPRESSION: Previously seen small cerebellar infarcts by MRI not appreciated on today's CT. No acute intracranial abnormality.  Ct Angio Head & Neck W Or Wo Contrast 04/11/2017 IMPRESSION:  1.  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  2. The hypoplastic right vertebral artery terminates at the right PICA.  3. Small irregular basilar artery appears to fill from a small right P1 segment, in a retrograde fashion.  4. Fetal type posterior cerebral artery is bilaterally.  5. Mild diffuse small vessel disease and circle Willis without other significant proximal stenosis,  aneurysm, or branch vessel occlusion.   Mr Brain Wo Contrast Result Date: 04/11/2017  IMPRESSION:  1. Multiple (45) punctate foci of acute ischemia within the cerebellum bilaterally. No other acute abnormality.  2. No hemorrhage or mass effect.  3. Normal appearance of the left vertebral artery flow void. Diminutive basilar artery.   Ct Cerebral Perfusion W Contrast Result Date: 04/11/2017 IMPRESSION:  Unremarkable CT perfusion exam.   Ct Head Code Stroke Wo Contrast Result Date: 04/11/2017 IMPRESSION:  1. No acute or focal cortical abnormality.  2. Asymmetric hyperdense right M1 segment. This is discordant from the patient's symptoms. Large vessel occlusion is considered less likely.  3. Remote lacunar infarct of the left basal ganglia.  4. Atrophy and white matter changes are mildly advanced for age  40. 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 3.Approx 50  To 75 % stenosis of Lt MCA distal M1. 4.Retrograde flow via RT PCOM to origin of AICAs    Echocardiogram:  Study Conclusions - Left ventricle: The cavity size was normal. Wall thickness was   increased in a pattern of mild LVH. Systolic function was   vigorous. The estimated ejection fraction was in the range of 65%   to 70%. Wall motion was normal; there were no regional wall   motion abnormalities. Doppler parameters are consistent with   abnormal left ventricular relaxation (grade 1 diastolic   dysfunction). The E/e&' ratio is >15, suggesting elevated LV   filling pressure. - Aortic valve: Sclerosis without stenosis. Mean gradient: 8 mmHg. - Left atrium: The atrium was normal in size. - Inferior vena cava: The vessel was normal in size. The   respirophasic diameter changes were in the normal range (>= 50%),   consistent with normal central venous pressure. Impressions: - LVEF 65-70%, mild LVH, normal wall motion, grade  1 DD, elevated   LV filling pressure, aortic valve sclerosis without stenosis,   normal LA size, normal IVC.     ASSESSMENT: Mr. BAY WAYSON is a 66 y.o. male with PMH of HTN, HLD, and Hx of CVA 14 years ago who presents with rapid waxing/waning of severe stroke symptoms in the setting of basilar occlusion.  The chronicity of this lesion is unclear given that the last imaging we have is in 2005. CT head- unremarkable and CTA- basilar occlusion.  IR Dr Estanislado Pandy performed emergent Cerebral Angiogram. 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 3.Approx 50  To 75 % stenosis of Lt MCA distal M1. 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 Remote lacunar infarct of the left basal ganglia  Suspected Etiology: large vessel occlusive disease Resultant Symptoms: right sided weakness, severe right hemi-facial weakness, severe dysarthria all resolved now Stroke Risk Factors: hyperlipidemia and hypertension Other Stroke Risk Factors: Advanced age, Obesity, Body mass index is 35.58 kg/m.  Hx  stroke  Patient participating in the stroke atrial fibrillation study and randomized to loop recorder insertion arm   PROCEDURES: S/P 4 vessel cerebral arteriogram. RT CFA approach. Findings. 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 3.Approx 50  To 75 % stenosis of Lt MCA distal M1. 4.Retrograde flow via RT PCOM to origin of AICAs    Outstanding Stroke Work-up Studies:    Workup completed 04/12/2017:Patient's neuro exam is stable. No focal weakness or numbness or dysarthria noted. IV heparin in progress with plan to stop at 12 noon today. Patient will be evaluated again bytherapies as some mild ataxia was noted with walking this morning. Plan of care and  long-term prognosis reviewed with patient and wife at bedside. All questions answered. Patient will likely be discharged homein the morning.  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 He is doing functionally quite well. Discontinue IV heparin drip and continued on dual antiplatelet therapy of aspirin and Plavix for 3 months followed by Plavix alone. Aggressive risk factor modification. I had a long discussion the patient and his wife regarding the stroke risk factors, risk for recurrent strokes, need for risk factor modification and stroke prevention and answered questions.  Patient has decided to participate in the stroke atrial fibrillation trial prior and was randomized after signing informed consent to the loop recorder arm  04/13/17: Neuro exam stable.  Patient had loop recorder placed 04/12/2017.  Some mildly elevated blood pressures overnight.  Have added HCTZ 12.5 mg daily in divided Lotensin dose to twice daily.  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  He will need close follow-up with his PCP to manage his blood pressure and prediabetes.  He will be discharged home on aspirin Plavix and Zocor and will follow-up in the neurology clinic in 6 weeks.  He will follow-up with the stroke A. fib trial in approximately 6 months.  Update: 1:40PM Called by nursing for acute onset Right sided weakness, facial droop and dysarthria. B/P 157/90 and Blood sugar documented at 47. Patient put back into bed- flat, IV restarted, glucose IVP given and cardiac monitor put back on. Episode lasted approximately 10 minutes and patient returned to baseline. Patient examined by myself and Dr Leonie Man at bedside. Will continue to monitor closely and put discharge on hold for now. If symptoms reoccur will obtain STAT Head CT and begin IV Heparin if no  bleed noted on imaging  PLAN  04/14/2017: Recurrent symptoms on 04/13/2017 STAT Head CT - No acute intracranial abnormality. Now on Aspirin/ Plavix/ Statin and heparin per pharmacy protocol. 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, continue for 3 months and then Plavix alone  DYSPHAGIA: Passed -SLP swallow evaluation Aspiration Precautions in progress  LEUKOCYTOSIS Likely reactive to Angiogram Remains afebrile  HYPERTENSION: Stable, some elevated B/P's noted overnight SBP goals 120-140  Low dose HCTZ 12.5 mg added 12/14 Split Lotensin dose to BID 12/14 Restarted home B/P medications today 12/13 Home Meds: Lotensin, Norvasc  HYPERLIPIDEMIA:    Component Value Date/Time   CHOL 169 04/12/2017 0238   TRIG 119 04/12/2017 0238   HDL 41 04/12/2017 0238   CHOLHDL 4.1 04/12/2017 0238   VLDL 24 04/12/2017 0238   LDLCALC 104 (H) 04/12/2017 0238  Home Meds:  Zocor 20 mg LDL  goal < 70 Continued on  Zocor 40 mg daily Continue statin at discharge  PRE-DIABETES: Lab Results  Component Value Date   HGBA1C 5.9 (H) 04/12/2017  HgbA1c goal < 7.0 Needs PCP f/u  OBESITY Obesity, Body mass index is 35.58 kg/m. Greater than/equal to 30  Other Active Problems: Active Problems:   Stroke (cerebrum) [facility])  Hospital day # 3 VTE prophylaxis: SCD's  Diet : Diet Heart Room service appropriate? Yes; Fluid consistency: Thin    FAMILY UPDATES: Wife at bedside  TEAM UPDATES: Garvin Fila, MD     Prior Home Stroke Medications:  No antithrombotic and Zocor 40 mg  Discharge Stroke Meds: Please discharge patient on aspirin 81 mg daily, clopidogrel 75 mg daily and Zocor 40 mg   Disposition: 01-Home or Self Care Therapy Recs:        NONE Home Equipment:   NONE Follow Up:  Follow-up Information    Garvin Fila, MD. Schedule an appointment as soon as possible for a visit in 6 week(s).   Specialties:  Neurology, Radiology Contact information: [address] [place] [address] [place] an appointment as soon as possible for a visit in 2 week(s).   Specialty:  General Practice Contact information: [address] [place] [address] [phone]        Luanne Bras, MD. Schedule an appointment as soon as possible for a visit in 6 week(s).   Specialties:  Interventional Radiology, Radiology Contact information: [address] [place] [address] [phone]          ATTENDING NOTE: I reviewed above note and agree with the assessment and plan. I have made any additions or clarifications directly to the above note. Pt was seen and examined.   66 year old male with history of hyperlipidemia, hypertension, left BG stroke in 2005 admitted for right-sided weakness, waxing and waning.  CT head and neck showed left VI and PA occlusion.  Right VA ends up and packed,, bilateral fetal PCA in the bilateral ICA siphon atherosclerosis. Cerebral angiogram confirmed above findings.  MRI showed bilateral cerebellum punctate infarcts.  Put on DAPT and statin.  Had one episode of right-sided weakness before discharge, glucose 47.  Concerning for hypoglycemia versus hypotension.  Put on heparin IV and IV fluid.  Symptoms resolved, also started vital unremarkable.  We will continue IV fluid and heparin overnight. BP goal 130-150 due to BA  and VA occlusion. D/c HCTZ to avoid hypotension. If remains stable tomorrow, will DC with aspirin 325 and Plavix 75 as well as statin.   Rosalin Hawking, MD PhD Stroke Neurology 04/14/2017 6:31 PM     To contact Stroke Continuity provider, please refer to [email or web address] After hours, contact General Neurology

## 2017-04-15 DIAGNOSIS — I6502 Occlusion and stenosis of left vertebral artery: Secondary | ICD-10-CM

## 2017-04-15 LAB — BASIC METABOLIC PANEL
Anion gap: 9 (ref 5–15)
BUN: 13 mg/dL (ref 6–20)
CALCIUM: 9.2 mg/dL (ref 8.9–10.3)
CO2: 25 mmol/L (ref 22–32)
CREATININE: 1.12 mg/dL (ref 0.61–1.24)
Chloride: 103 mmol/L (ref 101–111)
GFR calc Af Amer: >60 mL/min (ref >60)
GFR calc non Af Amer: >60 mL/min (ref >60)
GLUCOSE: 103 mg/dL — AB (ref 65–99)
Potassium: 3.6 mmol/L (ref 3.5–5.1)
Sodium: 137 mmol/L (ref 135–145)

## 2017-04-15 LAB — CBC
HCT: 41.7 % (ref 39.0–52.0)
Hemoglobin: 14.2 g/dL (ref 13.0–17.0)
MCH: 30.5 pg (ref 26.0–34.0)
MCHC: 34.1 g/dL (ref 30.0–36.0)
MCV: 89.5 fL (ref 78.0–100.0)
PLATELETS: 254 10*3/uL (ref 150–400)
RBC: 4.66 MIL/uL (ref 4.22–5.81)
RDW: 14 % (ref 11.5–15.5)
WBC: 15.8 10*3/uL — ABNORMAL HIGH (ref 4.0–10.5)

## 2017-04-15 LAB — GLUCOSE, CAPILLARY
Glucose-Capillary: 106 mg/dL — ABNORMAL HIGH (ref 65–99)
Glucose-Capillary: 111 mg/dL — ABNORMAL HIGH (ref 65–99)
Glucose-Capillary: 171 mg/dL — ABNORMAL HIGH (ref 65–99)

## 2017-04-15 LAB — HEPARIN LEVEL (UNFRACTIONATED): Heparin Unfractionated: 0.43 IU/mL (ref 0.30–0.70)

## 2017-04-15 MED ORDER — ASPIRIN 325 MG PO TBEC: 325.0000 mg | DELAYED_RELEASE_TABLET | Freq: Every day | ORAL | 0 refills | Status: DC

## 2017-04-15 MED ORDER — HEPARIN (PORCINE) IN NACL 100-0.45 UNIT/ML-% IJ SOLN: 1550.0000 [IU]/h | INTRAMUSCULAR | Status: DC

## 2017-04-15 NOTE — Discharge Summary (Signed)
Stroke Discharge Summary  Patient ID: Randy Shepherd   MRN: [id number]      DOB: 01-Jul-1950  Date of Admission: 04/11/2017 Date of Discharge: 04/15/2017  Attending Physician:  Rosalin Hawking, MD, Stroke MD Consultant(s):   Treatment Team:  Stroke, Md, MD cardiology Dr Rayann Heman (Stroke / Afib Study) Patient's PCP:  Patient, No Pcp Per  DISCHARGE DIAGNOSIS:  punctate infarcts within the cerebellum bilaterally due to proximal basilar and left vertebral artery occlusion.   Active Problems:   HTN   HLD   Hx of stroke in 2005   Basilar artery occlusion   Left vertebral artery occlusion   hypoglycemia   Past Medical History:  Diagnosis Date  . Abscess 10/07/2012   RT KNEE  . Degenerative disc disease   . Depression   . DJD (degenerative joint disease)   . High cholesterol   . Hypertension   . Stroke ([place])   . Thyroid disease    Past Surgical History:  Procedure Laterality Date  . APPENDECTOMY    . IR ANGIO INTRA EXTRACRAN SEL COM CAROTID INNOMINATE BILAT MOD SED  04/11/2017  . IR ANGIO VERTEBRAL SEL SUBCLAVIAN INNOMINATE UNI R MOD SED  04/11/2017  . IR ANGIO VERTEBRAL SEL VERTEBRAL UNI L MOD SED  04/11/2017  . LOOP RECORDER INSERTION N/A 04/13/2017   Procedure: LOOP RECORDER INSERTION;  Surgeon: Thompson Grayer, MD;  Location: [facility];  Service: Cardiovascular;  Laterality: N/A;  . RADIOLOGY WITH ANESTHESIA N/A 04/11/2017   Procedure: IR WITH ANESTHESIA;  Surgeon: Luanne Bras, MD;  Location: [facility];  Service: Radiology;  Laterality: N/A;    Allergies as of 04/15/2017   No Known Allergies     Medication List    TAKE these medications   amLODipine 10 MG tablet Commonly known as:  NORVASC Take 10 mg by mouth daily.   ARIPiprazole 5 MG tablet Commonly known as:  ABILIFY Take 5 mg by mouth daily.   aspirin 325 MG EC tablet Take 1 tablet (325 mg total) by mouth daily. Start taking on:  04/16/2017   benazepril 20 MG tablet Commonly known as:   LOTENSIN Take 1 tablet (20 mg total) by mouth 2 (two) times daily. What changed:    medication strength  how much to take  when to take this   buPROPion 150 MG 12 hr tablet Commonly known as:  WELLBUTRIN SR Take 150 mg by mouth 2 (two) times daily.   clopidogrel 75 MG tablet Commonly known as:  PLAVIX Take 1 tablet (75 mg total) by mouth daily.   gabapentin 300 MG capsule Commonly known as:  NEURONTIN Take 300-600 mg by mouth See admin instructions. Take 300 mg by mouth four times daily, then take 600 mg by mouth at bedtime.   levothyroxine 200 MCG tablet Commonly known as:  SYNTHROID, LEVOTHROID Take 200 mcg by mouth daily before breakfast.   oxyCODONE 15 MG immediate release tablet Commonly known as:  ROXICODONE Take 15 mg by mouth every 6 (six) hours as needed for pain.   sertraline 100 MG tablet Commonly known as:  ZOLOFT Take 200 mg by mouth every morning.   simvastatin 40 MG tablet Commonly known as:  ZOCOR Take 20 mg by mouth every evening.       LABORATORY STUDIES CBC    Component Value Date/Time   WBC 15.8 (H) 04/15/2017 0444   RBC 4.66 04/15/2017 0444   HGB 14.2 04/15/2017 0444   HCT 41.7 04/15/2017  0444   PLT 254 04/15/2017 0444   MCV 89.5 04/15/2017 0444   MCH 30.5 04/15/2017 0444   MCHC 34.1 04/15/2017 0444   RDW 14.0 04/15/2017 0444   LYMPHSABS 3.5 04/11/2017 0732   MONOABS 0.9 04/11/2017 0732   EOSABS 0.3 04/11/2017 0732   BASOSABS 0.0 04/11/2017 0732   CMP    Component Value Date/Time   NA 137 04/15/2017 0444   K 3.6 04/15/2017 0444   CL 103 04/15/2017 0444   CO2 25 04/15/2017 0444   GLUCOSE 103 (H) 04/15/2017 0444   BUN 13 04/15/2017 0444   CREATININE 1.12 04/15/2017 0444   CALCIUM 9.2 04/15/2017 0444   PROT 8.1 04/11/2017 0732   ALBUMIN 4.1 04/11/2017 0732   AST 49 (H) 04/11/2017 0732   ALT 51 04/11/2017 0732   ALKPHOS 67 04/11/2017 0732   BILITOT 0.9 04/11/2017 0732   GFRNONAA >60 04/15/2017 0444   GFRAA >60 04/15/2017  0444   COAGS Lab Results  Component Value Date   INR 1.04 04/11/2017   Lipid Panel    Component Value Date/Time   CHOL 169 04/12/2017 0238   TRIG 119 04/12/2017 0238   HDL 41 04/12/2017 0238   CHOLHDL 4.1 04/12/2017 0238   VLDL 24 04/12/2017 0238   LDLCALC 104 (H) 04/12/2017 0238   HgbA1C  Lab Results  Component Value Date   HGBA1C 5.9 (H) 04/12/2017   Urinalysis    Component Value Date/Time   COLORURINE STRAW (A) 04/11/2017 1051   APPEARANCEUR CLEAR 04/11/2017 1051   LABSPEC 1.040 (H) 04/11/2017 1051   PHURINE 8.0 04/11/2017 1051   GLUCOSEU NEGATIVE 04/11/2017 1051   HGBUR NEGATIVE 04/11/2017 1051   BILIRUBINUR NEGATIVE 04/11/2017 1051   KETONESUR NEGATIVE 04/11/2017 1051   PROTEINUR 30 (A) 04/11/2017 1051   NITRITE NEGATIVE 04/11/2017 1051   LEUKOCYTESUR NEGATIVE 04/11/2017 1051   Urine Drug Screen     Component Value Date/Time   LABOPIA NONE DETECTED 04/11/2017 1051   COCAINSCRNUR NONE DETECTED 04/11/2017 1051   LABBENZ NONE DETECTED 04/11/2017 1051   AMPHETMU NONE DETECTED 04/11/2017 1051   THCU NONE DETECTED 04/11/2017 1051   LABBARB NONE DETECTED 04/11/2017 1051    Alcohol Level    Component Value Date/Time   ETH <10 04/11/2017 0732     SIGNIFICANT DIAGNOSTIC STUDIES I have personally reviewed the radiological images below and agree with the radiology interpretations.  CT Head Without Contrast 04/13/2017 IMPRESSION: Previously seen small cerebellar infarcts by MRI not appreciated on today's CT. No acute intracranial abnormality.  Ct Angio Head & Neck W Or Wo Contrast 04/11/2017 IMPRESSION:  1. 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  2. The hypoplastic right vertebral artery terminates at the right PICA.  3. Small irregular basilar artery appears to fill from a small right P1 segment, in a retrograde fashion.  4. Fetal type posterior cerebral artery is bilaterally.  5. Mild diffuse small vessel disease and circle Willis  without other significant proximal stenosis, aneurysm, or branch vessel occlusion.   Mr Brain Wo Contrast Result Date: 04/11/2017  IMPRESSION:  1. Multiple (45) punctate foci of acute ischemia within the cerebellum bilaterally. No other acute abnormality.  2. No hemorrhage or mass effect.  3. Normal appearance of the left vertebral artery flow void. Diminutive basilar artery.   Ct Cerebral Perfusion W Contrast Result Date: 04/11/2017 IMPRESSION:  Unremarkable CT perfusion exam.   Ct Head Code Stroke Wo Contrast Result Date: 04/11/2017 IMPRESSION:  1. No acute or focal cortical abnormality.  2. Asymmetric hyperdense right M1 segment. This is discordant from the patient's symptoms. Large vessel occlusion is considered less likely.  3. Remote lacunar infarct of the left basal ganglia.  4. Atrophy and white matter changes are mildly advanced for age  60. ASPECTS is 10/10    Cerebral Angiogram  04/11/2017 1.Occluded RT VBJ distal to PICA. 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 3.Approx 50 To 75 % stenosis of Lt MCA distal M1. 4.Retrograde flow via RT PCOM to origin of AICAs   Echocardiogram:  Study Conclusions - Left ventricle: The cavity size was normal. Wall thickness was increased in a pattern of mild LVH. Systolic function was vigorous. The estimated ejection fraction was in the range of 65% to 70%. Wall motion was normal; there were no regional wall motion abnormalities. Doppler parameters are consistent with abnormal left ventricular relaxation (grade 1 diastolic dysfunction). The E/e&' ratio is >15, suggesting elevated LV filling pressure. - Aortic valve: Sclerosis without stenosis. Mean gradient: 8 mmHg. - Left atrium: The atrium was normal in size. - Inferior vena cava: The vessel was normal in size. The respirophasic diameter changes were in the normal range (>= 50%), consistent with normal central venous  pressure. Impressions: - LVEF 65-70%, mild LVH, normal wall motion, grade 1 DD, elevated LV filling pressure, aortic valve sclerosis without stenosis, normal LA size, normal IVC      HISTORY OF PRESENT ILLNESS  Randy Shepherd is a 66 y.o. male with a history of htn, hl, and stroke who presented with recurrent episodes of right sided weakness that started the night prior to admission. On awakening, he had difficulty getting out of bed and his right arm was numb. This improved, but he always had persistent numbness. On arrival to [facility], he had mild facial droop and right sided numbness, but no other symptoms. Following his CT, he developed sudden worsening of severe right sided weakness. He was taken for CTA/P which revealed a basilar occlusion.   LKW: Last night, 7pm(when he went to bed) tpa given?: no, out of window.     HOSPITAL COURSE Randy Shepherd is a 66 y.o. male with PMH of HTN, HLD, and Hx of CVA 14 years ago who presented with rapid waxing/waning of severe stroke symptoms in the setting of basilar occlusion. The chronicity of this lesion was unclear given that the last imaging we have was in 2005. CT head- unremarkable and CTA- basilar occlusion.  IR Dr Estanislado Pandy performed emergent Cerebral Angiogram. 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 3.Approx 50 To 75 % stenosis of Lt MCA distal M1. 4.Retrograde flow via RT PCOM to origin of AICAs   Multiple foci of acute ischemia within the cerebellum bilaterally due to Proximal basilar and left vertebral artery occlusion. Remote lacunar infarct of the left basal ganglia  Suspected Etiology: large vessel occlusive disease Resultant Symptoms: right sided weakness, severe right hemi-facial weakness, severe dysarthria all resolved now Stroke Risk Factors: hyperlipidemia and hypertension Other Stroke Risk Factors: Advanced age, Obesity, Body mass index is 35.58 kg/m.   Hx stroke  Patient participating in the stroke atrial fibrillation study and randomized to loop recorder insertion arm   PROCEDURES: S/P 4 vessel cerebral arteriogram. RT CFA approach. Findings. 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 3.Approx 50 To 75 % stenosis of Lt MCA distal M1. 4.Retrograde flow via  RT PCOM to origin of AICAs   Outstanding Stroke Work-up Studies:    Workup completed 04/12/2017:Patient's neuro exam is stable. No focal weakness or numbness or dysarthria noted. IV heparin in progress with plan to stop at 12 noon today. Patient will be evaluated again bytherapies as some mild ataxia was noted with walking this morning. Plan of care and long-term prognosis reviewed with patient and wife at bedside. All questions answered. Patient will likely be discharged homein the morning.  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 He is doing functionally quite well. Discontinue IV heparin drip and continued on dual antiplatelet therapy of aspirin and Plavix for 3 months followed by Plavix alone. Aggressive risk factor modification. I had a long discussion the patient and his wife regarding the stroke risk factors, risk for recurrent strokes, need for risk factor modification and stroke prevention and answered questions.  Patient has decided to participate in the stroke atrial fibrillation trial prior and was randomized after signing informed consent to the loop recorder arm  04/13/17:Neuro exam stable. Patient had loop recorder placed 04/12/2017. Some mildly elevated blood pressures overnight. Have added HCTZ 12.5 mg daily in divided Lotensin dose to twice daily. 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 He will need close follow-up with his PCP to  manage his blood pressure and prediabetes. He will be discharged home on aspirin Plavix and Zocor and will follow-up in the neurology clinic in 6 weeks. He will follow-up with the stroke A. fib trial in approximately 6 months.  Update: 1:40PM Called by nursing for acute onset Right sided weakness, facial droop and dysarthria. B/P 157/90 and Blood sugar documented at 47. Patient put back into bed- flat, IV restarted, glucose IVP given and cardiac monitor put back on. Episode lasted approximately 10 minutes and patient returned to baseline. Patient examined by myself and Dr Leonie Man at bedside. Will continue to monitor closely and put discharge on hold for now. If symptoms reoccur will obtain STAT Head CT and begin IV Heparin if no bleed noted on imaging  PLAN  04/14/2017: Recurrent symptoms on 04/13/2017 STAT Head CT - No acute intracranial abnormality. Now on Aspirin/ Plavix/ Statin and heparin per pharmacy protocol. Frequent neuro checks Telemetry monitoring PT/OT/SLP Patient considering participation in Stroke - AFIB trial Ongoing aggressive stroke risk factor management Patient counseled to be compliant withhisantithrombotic medications Patient counseled to avoid dehydration and recommended to moderate his alcohol intake Follow up with [facility] in 6 weeks Follow-up in the cardiovascular research clinic for stroke atrial fibrillation study follow-up as per study scheduled  HX OF STROKES: 14 years ago - Lacunar infarct of the left basal ganglia, no deficits  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, continue for 3 months and then Plavix alone  DYSPHAGIA: Passed -SLP swallow evaluation Aspiration Precautions in progress  LEUKOCYTOSIS Likely reactive to Angiogram Remains afebrile  HYPERTENSION: Stable, some elevated B/P's noted overnight SBP goals 120-140 Low dose HCTZ 12.5 mg added 12/14 Split Lotensin dose to BID 12/14 Restarted home B/P medications today  12/13 Home Meds: Lotensin, Norvasc  HYPERLIPIDEMIA: Labs(Brief)          Component Value Date/Time   CHOL 169 04/12/2017 0238   TRIG 119 04/12/2017 0238   HDL 41 04/12/2017 0238   CHOLHDL 4.1 04/12/2017 0238   VLDL 24 04/12/2017 0238   LDLCALC 104 (  H) 04/12/2017 0238    Home Meds:  Zocor 20 mg LDL  goal < 70 Continued on  Zocor 40 mg daily Continue statin at discharge  PRE-DIABETES: RecentLabs       Lab Results  Component Value Date   HGBA1C 5.9 (H) 04/12/2017    HgbA1c goal < 7.0 Needs PCP f/u  OBESITY Obesity, Body mass index is 35.58 kg/m. Greater than/equal to 30  Other Active Problems: Active Problems:   Stroke (cerebrum) [facility])  Hospital day # 3 VTE prophylaxis: SCD's  Diet : Diet Heart Room service appropriate? Yes; Fluid consistency: Thin   FAMILYUPDATES: Wife at bedside  TEAM UPDATES: Garvin Fila, MD     Prior Home Stroke Medications:  No antithrombotic and Zocor 40 mg  Discharge Stroke Meds: Please discharge patient on aspirin 325 mg daily, clopidogrel 75 mg daily and Zocor 40 mg   Disposition: 01-Home or Self Care Therapy Recs:       NONE Home Equipment:   NONE Follow Up:     Follow-up Information    Garvin Fila, MD. Schedule an appointment as soon as possible for a visit in 6 week(s).   Specialties:  Neurology, Radiology Contact information: [address] [place] [address] [place] an appointment as soon as possible for a visit in 2 week(s).   Specialty:  General Practice Contact information: [address] [place] [address] [phone]        Luanne Bras, MD. Schedule an appointment as soon as possible for a visit in 6 week(s).   Specialties:  Interventional Radiology, Radiology Contact information: [address] [place] [address] [phone]          DISCHARGE EXAM Blood pressure (!)  147/88, pulse 73, temperature 98 F (36.7 C), temperature source Oral, resp. rate 18, height 5\' 9"  (1.753 m), weight 240 lb 15.4 oz (109.3 kg), SpO2 99 %. General - Well nourished, well developed, in no apparent distress Respiratory - Lungs clear bilaterally. No wheezing. Cardiovascular - Regular rate and rhythm  Neuro: Mental Status: Patient is awake, alert, oriented to person, place, month, year, and situation. Patient is able to give a clear and coherent history. No signs of aphasia or neglect Cranial Nerves: II: Visual Fields are full. Pupils are equal, round, and reactive to light.  III,IV, VI: EOMI without ptosis or diploplia.  V: Facial sensation is symmetric to temperature VII: Facial movement is symmetric.  VIII: hearing is intact to voice X: Uvula elevates symmetrically XI: Shoulder shrug is symmetric. XII: tongue is midline without atrophy or fasciculations.  Motor: Tone is normal. Bulk is normal. 5/5 strength was present in all four extremities.  Sensory: Sensation is decreased in the right arm.  Cerebellar: FNF intact bilaterally   Discharge instructions to the patient: 1. Gradually increase activity as tolerated. 2. Follow-up with physicians as instructed. 3. Avoid low blood pressure. Recommend systolic blood pressure 160-109 mmHg. 4. Stay well hydrated   Discharge Diet   Diet Heart Room service appropriate? Yes; Fluid consistency: Thin liquids  DISCHARGE PLAN  Disposition:  Discharge to home  aspirin 325 mg daily and clopidogrel 75 mg daily for secondary stroke prevention.  Ongoing risk factor control by Primary Care Physician at time of discharge  Follow-up at the [facility] in [place] in 2 weeks.  Follow-up with Dr. Antony Contras, Stroke Clinic in 6 weeks, office to schedule appointment.  Follow-up with Dr. Estanislado Pandy in 6 weeks  Avoid hypotension. Recommend systolic blood pressure 432-761.  Stay well-hydrated.   40 minutes were spent  preparing discharge.  Mikey Bussing PA-C [facility] Pager [phone] 04/15/2017, 12:07 PM  ATTENDING NOTE: I reviewed above note and agree with the assessment and plan. I have made any additions or clarifications directly to the above note. Pt was seen and examined.   66 year old male with history of hyperlipidemia, hypertension, left BG stroke in 2005 admitted for right-sided weakness, waxing and waning.  CT head and neck showed left VA and BA occlusion.  Right VA ends up at PICA, bilateral fetal PCA and bilateral ICA siphon atherosclerosis. Cerebral angiogram confirmed above findings.  MRI showed bilateral cerebellum punctate infarcts.  Put on DAPT and statin.  Had one episode of right-sided weakness on standing up on 04/13/17 found to have glucose at 47.  Concerning for hypoglycemia versus hypotension.  Put on heparin IV and IV fluid.  Symptoms resolved, orthostatic vital unremarkable.  BP meds ajusted and BP goal 130-150 due to BA and VA occlusion. D/c HCTZ to avoid hypotension. Pt discharged in good condition with follow up with Dr. Leonie Man and Dr. Estanislado Pandy as outpt.   Rosalin Hawking, MD PhD Stroke Neurology 04/14/2017 6:31 PM

## 2017-04-15 NOTE — Progress Notes (Signed)
Patient ready for discharge to home; discharge instructions given and reviewed; Rx's given; patient discharged out via wheelchair accompanied by his wife.

## 2017-04-15 NOTE — Discharge Instructions (Signed)
1. Gradually increase activity as tolerated. 2. Follow-up with physicians as instructed. 3. Avoid low blood pressure. Recommend systolic blood pressure 251-898 mmHg. 4. Stay well hydrated

## 2017-04-17 ENCOUNTER — Other Ambulatory Visit (HOSPITAL_COMMUNITY): Payer: Self-pay | Admitting: Interventional Radiology

## 2017-04-17 DIAGNOSIS — I771 Stricture of artery: Secondary | ICD-10-CM

## 2017-04-27 ENCOUNTER — Ambulatory Visit (INDEPENDENT_AMBULATORY_CARE_PROVIDER_SITE_OTHER): Payer: Self-pay | Admitting: *Deleted

## 2017-04-27 DIAGNOSIS — I6300 Cerebral infarction due to thrombosis of unspecified precerebral artery: Secondary | ICD-10-CM

## 2017-04-27 LAB — CUP PACEART INCLINIC DEVICE CHECK
Date Time Interrogation Session: 20181228161640
MDC IDC PG IMPLANT DT: 20181214

## 2017-04-27 NOTE — Progress Notes (Signed)
Wound check appointment. Steri-strips removed. Wound without redness or edema. Incision edges approximated, wound well healed. Battery status: Good. R-waves 0.78mV. 0 symptom episodes, 0 tachy episodes, 0 pause episodes, 0 brady episodes. 0 AF episodes (0% burden). Monthly summary reports and ROV with JA PRN.

## 2017-04-30 ENCOUNTER — Other Ambulatory Visit: Payer: Self-pay

## 2017-04-30 NOTE — Patient Outreach (Signed)
[facility]) Care Management  04/30/2017  Randy Shepherd 10/14/1950 [id number]  EMMI: stroke Referral date: 04/30/17 Referral source: EMMI stroke red alert Referral reason: problems with medications:  Day # 13  Telephone call to patient regarding EMMI stroke red alert. HIPAA verified.  Discussed EMMI stroke program with patient. Patient states he does not have any questions or problems with his medications. Patient state he is getting his medications from the [place]. Patient states he had a follow up visit with his [place] doctor on 04/16/17 and has another follow up on 05/04/17.  Patient states he has transportation to his appointments.  Patient states he was in the hospital due to having a stroke. Patient denies having any home health services. Patient states he is able to take care of himself. Patient denies any new onset of symptoms.  RNCM advised patient to notify MD of any changes in condition prior to scheduled appointment. RNCM verified patient aware of 911 services for urgent/ emergent needs. RNCM reviewed signs/symptoms of stroke with patient. Advised patient to call 911 for stroke like symptoms. RNCM advised patient to continue to take his medications as prescribed and keep follow up appointments with his doctor. Patient verbalized understanding.  Patient denies any further needs at this time.   PLAN: RNCM will refer patient to care management assistant to close due to patient being assessed and having no further needs.  RNCM will send patient Advance directive as requested.   [name] RN,BSN,CCM [facility] Telephonic  [phone]

## 2017-05-02 ENCOUNTER — Other Ambulatory Visit: Payer: Self-pay | Admitting: Internal Medicine

## 2017-05-14 ENCOUNTER — Encounter: Payer: Medicare Other | Admitting: *Deleted

## 2017-05-14 ENCOUNTER — Ambulatory Visit (INDEPENDENT_AMBULATORY_CARE_PROVIDER_SITE_OTHER): Payer: Medicare Other | Admitting: *Deleted

## 2017-05-14 DIAGNOSIS — I6300 Cerebral infarction due to thrombosis of unspecified precerebral artery: Secondary | ICD-10-CM

## 2017-05-14 DIAGNOSIS — Z006 Encounter for examination for normal comparison and control in clinical research program: Secondary | ICD-10-CM

## 2017-05-14 NOTE — Progress Notes (Unsigned)
STROKE-AF Research study month 1 follow up visit obtained. Patient denies any adverse events or changes to his medication since discharge. Device checked. Next research required visit is due between 27/feb/19-29/mar/19. This visit will be a phone call with instruction on how to complete a manuel carelink transmission.

## 2017-05-16 NOTE — Progress Notes (Signed)
Carelink Summary Report / Loop Recorder 

## 2017-05-23 ENCOUNTER — Ambulatory Visit (HOSPITAL_COMMUNITY)
Admission: RE | Admit: 2017-05-23 | Discharge: 2017-05-23 | Disposition: A | Discharge status: Home / Self Care | Payer: Non-veteran care | Source: Ambulatory Visit | Attending: Interventional Radiology | Admitting: Interventional Radiology

## 2017-05-23 DIAGNOSIS — I771 Stricture of artery: Secondary | ICD-10-CM

## 2017-05-23 HISTORY — PX: IR RADIOLOGIST EVAL & MGMT: IMG5224

## 2017-05-23 LAB — CUP PACEART REMOTE DEVICE CHECK
Date Time Interrogation Session: 20190113133821
MDC IDC PG IMPLANT DT: 20181214

## 2017-05-24 ENCOUNTER — Encounter (HOSPITAL_COMMUNITY): Payer: Self-pay | Admitting: Interventional Radiology

## 2017-06-12 ENCOUNTER — Encounter: Payer: Medicare Other | Admitting: *Deleted

## 2017-06-13 ENCOUNTER — Ambulatory Visit (INDEPENDENT_AMBULATORY_CARE_PROVIDER_SITE_OTHER): Payer: Medicare Other | Admitting: Neurology

## 2017-06-13 ENCOUNTER — Encounter: Payer: Self-pay | Admitting: Neurology

## 2017-06-13 VITALS — BP 138/84 | HR 82 | Ht 68.0 in | Wt 245.6 lb

## 2017-06-13 DIAGNOSIS — I651 Occlusion and stenosis of basilar artery: Secondary | ICD-10-CM | POA: Diagnosis not present

## 2017-06-13 DIAGNOSIS — G4733 Obstructive sleep apnea (adult) (pediatric): Secondary | ICD-10-CM

## 2017-06-13 NOTE — Patient Instructions (Signed)
I had a long d/w patient about his recent stroke,basilar artery occlusion risk for recurrent stroke/TIAs, personally independently reviewed imaging studies and stroke evaluation results and answered questions.Continue aspirin 325 mg daily and clopidogrel 75 mg daily  for one more month and then discontinue aspirin and stay on clopidogrel alone secondary stroke prevention and maintain strict control of hypertension with blood pressure goal below 130/90, diabetes with hemoglobin A1c goal below 6.5% and lipids with LDL cholesterol goal below 70 mg/dL. I also advised the patient to eat a healthy diet with plenty of whole grains, cereals, fruits and vegetables, exercise regularly and maintain ideal body weight. Check polysomnogram for sleep apnea. Patient was also advised to keep himself well hydrated and to avoid hypotension Followup in the future with my nurse practitioner in 3 months or call earlier if necessary.  Stroke Prevention Some medical conditions and behaviors are associated with a higher chance of having a stroke. You can help prevent a stroke by making nutrition, lifestyle, and other changes, including managing any medical conditions you may have. What nutrition changes can be made?  Eat healthy foods. You can do this by: ? Choosing foods high in fiber, such as fresh fruits and vegetables and whole grains. ? Eating at least 5 or more servings of fruits and vegetables a day. Try to fill half of your plate at each meal with fruits and vegetables. ? Choosing lean protein foods, such as lean cuts of meat, poultry without skin, fish, tofu, beans, and nuts. ? Eating low-fat dairy products. ? Avoiding foods that are high in salt (sodium). This can help lower blood pressure. ? Avoiding foods that have saturated fat, trans fat, and cholesterol. This can help prevent high cholesterol. ? Avoiding processed and premade foods.  Follow your health care provider's specific guidelines for losing weight,  controlling high blood pressure (hypertension), lowering high cholesterol, and managing diabetes. These may include: ? Reducing your daily calorie intake. ? Limiting your daily sodium intake to 1,500 milligrams (mg). ? Using only healthy fats for cooking, such as olive oil, canola oil, or sunflower oil. ? Counting your daily carbohydrate intake. What lifestyle changes can be made?  Maintain a healthy weight. Talk to your health care provider about your ideal weight.  Get at least 30 minutes of moderate physical activity at least 5 days a week. Moderate activity includes brisk walking, biking, and swimming.  Do not use any products that contain nicotine or tobacco, such as cigarettes and e-cigarettes. If you need help quitting, ask your health care provider. It may also be helpful to avoid exposure to secondhand smoke.  Limit alcohol intake to no more than 1 drink a day for nonpregnant women and 2 drinks a day for men. One drink equals 12 oz of beer, 5 oz of wine, or 1 oz of hard liquor.  Stop any illegal drug use.  Avoid taking birth control pills. Talk to your health care provider about the risks of taking birth control pills if: ? You are over 30 years old. ? You smoke. ? You get migraines. ? You have ever had a blood clot. What other changes can be made?  Manage your cholesterol levels. ? Eating a healthy diet is important for preventing high cholesterol. If cholesterol cannot be managed through diet alone, you may also need to take medicines. ? Take any prescribed medicines to control your cholesterol as told by your health care provider.  Manage your diabetes. ? Eating a healthy diet and exercising regularly  are important parts of managing your blood sugar. If your blood sugar cannot be managed through diet and exercise, you may need to take medicines. ? Take any prescribed medicines to control your diabetes as told by your health care provider.  Control your hypertension. ? To  reduce your risk of stroke, try to keep your blood pressure below 130/80. ? Eating a healthy diet and exercising regularly are an important part of controlling your blood pressure. If your blood pressure cannot be managed through diet and exercise, you may need to take medicines. ? Take any prescribed medicines to control hypertension as told by your health care provider. ? Ask your health care provider if you should monitor your blood pressure at home. ? Have your blood pressure checked every year, even if your blood pressure is normal. Blood pressure increases with age and some medical conditions.  Get evaluated for sleep disorders (sleep apnea). Talk to your health care provider about getting a sleep evaluation if you snore a lot or have excessive sleepiness.  Take over-the-counter and prescription medicines only as told by your health care provider. Aspirin or blood thinners (antiplatelets or anticoagulants) may be recommended to reduce your risk of forming blood clots that can lead to stroke.  Make sure that any other medical conditions you have, such as atrial fibrillation or atherosclerosis, are managed. What are the warning signs of a stroke? The warning signs of a stroke can be easily remembered as BEFAST.  B is for balance. Signs include: ? Dizziness. ? Loss of balance or coordination. ? Sudden trouble walking.  E is for eyes. Signs include: ? A sudden change in vision. ? Trouble seeing.  F is for face. Signs include: ? Sudden weakness or numbness of the face. ? The face or eyelid drooping to one side.  A is for arms. Signs include: ? Sudden weakness or numbness of the arm, usually on one side of the body.  S is for speech. Signs include: ? Trouble speaking (aphasia). ? Trouble understanding.  T is for time. ? These symptoms may represent a serious problem that is an emergency. Do not wait to see if the symptoms will go away. Get medical help right away. Call your local  emergency services (911 in the [place] Do not drive yourself to the hospital.  Other signs of stroke may include: ? A sudden, severe headache with no known cause. ? Nausea or vomiting. ? Seizure.  Where to find more information: For more information, visit:  American Stroke Association: www.strokeassociation.org  National Stroke Association: www.stroke.org  Summary  You can prevent a stroke by eating healthy, exercising, not smoking, limiting alcohol intake, and managing any medical conditions you may have.  Do not use any products that contain nicotine or tobacco, such as cigarettes and e-cigarettes. If you need help quitting, ask your health care provider. It may also be helpful to avoid exposure to secondhand smoke.  Remember BEFAST for warning signs of stroke. Get help right away if you or a loved one has any of these signs. This information is not intended to replace advice given to you by your health care provider. Make sure you discuss any questions you have with your health care provider. Document Released: 05/25/2004 Document Revised: 05/23/2016 Document Reviewed: 05/23/2016 Elsevier Interactive Patient Education  [employer].

## 2017-06-13 NOTE — Progress Notes (Signed)
[facility] [address] [place]. [place] [address] [phone]       OFFICE FOLLOW-UP NOTE  [name] Date of Birth:  19-Jul-1950 Medical Record Number:  [id number]   HPI: Mr Ilic is a 67 year old Caucasian male seen today for the first office follow-up visit following hospital admission for stroke in December 2018. History is obtained from the patient and review of electronic medical records. I have personally reviewed imaging films. DUEY LILLER is a 67 y.o. male with a history of htn, hl, stroke who presents with recurrent episodes of right sided weakness that started last night. On awakening, he had difficulty getting out of bed and his right arm was numb. This improved, but he always had persistent numbness. On arrival to [facility], he had mild facial droop and right sided numbness, but no other symptoms. Following his CT, he developed sudden worsening of severe right sided weakness. He was taken for CTA/P which revealed a basilar occlusion. LKW: Last night, 7pm(when he went to bed).tpa given?: no, out of window. The chronicity of his basilar occlusion was unclear as he had multiple transient episodes at home of right-sided weakness facial numbness severe dysarthria lasting 10-15 minutes prior to coming to the hospital. He did have a history of prior stroke 14 years ago with no residual deficits. 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 MRI scan of the brain showed 4-5 tiny punctate foci of acute ischemia within bilateral cerebellum. CT perfusion scan was unremarkable. Diagnostic 4 vessel cerebral catheter and event performed by Dr. Estanislado Pandy on 04/11/17 showed occluded at hypoplastic right vertebral artery distal to the PICA. 75% stenosis of the left vertebral basilar junction distal to the PICA and 50-75% stenosis of the left distal M1 middle cerebral artery.  There was retrograde flow noted in the upper one third of the basilar artery through right posterior communicating artery. Since the chronicity of his occlusion was unclear patient was not felt to be candidate for revascularization and only a diagnostic catheter and edematous performed. Contrast residue echo showed normal ejection fraction. LDL cholesterol was elevated at 104 mg percent. Hemoglobin A1c was 5.9. Patient was started on aspirin and Plavix after initially he was started on heparin for a couple of days till he remained stable. Patient by spreading the stroke atrial fibrillation trial and was randomized to the loop recorder placement. He states his done well since discharge. He is had practically no residual deficits. He occasionally may get dizzy when he tries to walk fast. His starting aspirin and Plavix without bruising or bleeding. His blood pressure is well controlled today it is 138/8 for in office. Is starting Zocor 40 mg well without muscle aches and pains. On inquiry she admits to snoring as well as having easy daytime sleepiness and falls asleep easily while riding as a passenger in a car. He has not been evaluated for sleep apnea. He does have a prior history of stroke in 2005 but is unable to give me any specific details. He has his medical follow-up at the [facility] clinic.   ROS:   14 system review of systems is positive for  ringing in the ears, daytime sleepiness, snoring and all other systems negative PMH:  Past Medical History:  Diagnosis Date  . Abscess 10/07/2012   RT KNEE  . Degenerative disc disease   . Depression   . DJD (degenerative joint disease)   .  High cholesterol   . Hypertension   . Stroke ([place])   . Thyroid disease     Social History:  Social History   Socioeconomic History  . Marital status: Married    Spouse name: Not on file  . Number of children: Not on file  . Years of education: Not on file  . Highest education level: Not on file    Social Needs  . Financial resource strain: Not on file  . Food insecurity - worry: Not on file  . Food insecurity - inability: Not on file  . Transportation needs - medical: Not on file  . Transportation needs - non-medical: Not on file  Occupational History  . Not on file  Tobacco Use  . Smoking status: Former Research scientist (life sciences)  . Smokeless tobacco: Never Used  Substance and Sexual Activity  . Alcohol use: Yes    Alcohol/week: 1.8 oz    Types: 3 Cans of beer per week    Comment: DAILY BEER  . Drug use: No  . Sexual activity: Not on file  Other Topics Concern  . Not on file  Social History Narrative  . Not on file    Medications:   Current Outpatient Medications on File Prior to Visit  Medication Sig Dispense Refill  . amLODipine (NORVASC) 10 MG tablet Take 10 mg by mouth daily.     . ARIPiprazole (ABILIFY) 5 MG tablet Take 5 mg by mouth daily.    Marland Kitchen aspirin EC 325 MG EC tablet Take 1 tablet (325 mg total) by mouth daily. 30 tablet 0  . benazepril (LOTENSIN) 20 MG tablet Take 1 tablet (20 mg total) by mouth 2 (two) times daily. 60 tablet 1  . buPROPion (WELLBUTRIN SR) 150 MG 12 hr tablet Take 150 mg by mouth 2 (two) times daily.    . clopidogrel (PLAVIX) 75 MG tablet Take 1 tablet (75 mg total) by mouth daily. 30 tablet 1  . gabapentin (NEURONTIN) 300 MG capsule Take 300-600 mg by mouth See admin instructions. Take 300 mg by mouth four times daily, then take 600 mg by mouth at bedtime.    Marland Kitchen levothyroxine (SYNTHROID, LEVOTHROID) 200 MCG tablet Take 200 mcg by mouth daily before breakfast.     . oxyCODONE (ROXICODONE) 15 MG immediate release tablet Take 15 mg by mouth every 6 (six) hours as needed for pain.    Marland Kitchen sertraline (ZOLOFT) 100 MG tablet Take 200 mg by mouth every morning.    . simvastatin (ZOCOR) 40 MG tablet Take 20 mg by mouth every evening.     No current facility-administered medications on file prior to visit.     Allergies:  No Known Allergies  Physical Exam General:  Mildly obese middle-age Caucasian male, seated, in no evident distress Head: head normocephalic and atraumatic.  Neck: supple with no carotid or supraclavicular bruits Cardiovascular: regular rate and rhythm, no murmurs Musculoskeletal: no deformity Skin:  no rash/petichiae Vascular:  Normal pulses all extremities Vitals:   06/13/17 0825  BP: 138/84  Pulse: 82   Neurologic Exam Mental Status: Awake and fully alert. Oriented to place and time. Recent and remote memory intact. Attention span, concentration and fund of knowledge appropriate. Mood and affect appropriate.  Cranial Nerves: Fundoscopic exam reveals sharp disc margins. Pupils equal, briskly reactive to light. Extraocular movements full without nystagmus. Visual fields full to confrontation. Hearing intact. Facial sensation intact. Face, tongue, palate moves normally and symmetrically.  Motor: Normal bulk and tone. Normal strength in all tested  extremity muscles. Sensory.: intact to touch ,pinprick .position and vibratory sensation.  Coordination: Rapid alternating movements normal in all extremities. Finger-to-nose and heel-to-shin performed accurately bilaterally. Gait and Station: Arises from chair without difficulty. Stance is normal. Gait demonstrates normal stride length and balance . Able to heel, toe and tandem walk with mild difficulty.  Reflexes: 1+ and symmetric. Toes downgoing.   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 Remote history of stroke in 2005. Multiple vascular risk factors of hypertension, hyperlipidemia, obesity and intracranial atherosclerosis. Suspected sleep apnea    PLAN: I had a long d/w patient about his recent stroke,basilar artery occlusion risk for recurrent stroke/TIAs, personally independently reviewed imaging studies and stroke evaluation results and  answered questions.Continue aspirin 325 mg daily and clopidogrel 75 mg daily  for one more month and then discontinue aspirin and stay on clopidogrel alone secondary stroke prevention and maintain strict control of hypertension with blood pressure goal below 130/90, diabetes with hemoglobin A1c goal below 6.5% and lipids with LDL cholesterol goal below 70 mg/dL. I also advised the patient to eat a healthy diet with plenty of whole grains, cereals, fruits and vegetables, exercise regularly and maintain ideal body weight. Check polysomnogram for sleep apnea. Patient was also advised to keep himself well hydrated and to avoid hypotension Followup in the future with my nurse practitioner in 3 months or call earlier if necessary. Greater than 50% of time during this 25 minute visit was spent on counseling,explanation of diagnosis of basilar occlusion, brainstem strokes, planning of further management, discussion with patient and family and coordination of care Antony Contras, MD  [facility] [address] [place], [place] [address]  Phone [phone] Fax [phone] Note: This document was prepared with digital dictation and possible smart phrase technology. Any transcriptional errors that result from this process are unintentional

## 2017-07-03 ENCOUNTER — Encounter: Payer: Self-pay | Admitting: Physician Assistant

## 2017-07-04 ENCOUNTER — Encounter: Payer: Self-pay | Admitting: *Deleted

## 2017-07-04 DIAGNOSIS — Z006 Encounter for examination for normal comparison and control in clinical research program: Secondary | ICD-10-CM

## 2017-07-04 NOTE — Progress Notes (Signed)
STROKE-AF Research study month 3 manuel loop transmission: No arrhythmias noted on print out

## 2017-07-16 ENCOUNTER — Telehealth: Payer: Self-pay | Admitting: Emergency Medicine

## 2017-07-16 ENCOUNTER — Encounter: Payer: Self-pay | Admitting: Physician Assistant

## 2017-07-16 ENCOUNTER — Ambulatory Visit (INDEPENDENT_AMBULATORY_CARE_PROVIDER_SITE_OTHER): Payer: Medicare Other | Admitting: Physician Assistant

## 2017-07-16 VITALS — BP 132/80 | HR 88 | Ht 68.0 in | Wt 244.0 lb

## 2017-07-16 DIAGNOSIS — Z1211 Encounter for screening for malignant neoplasm of colon: Secondary | ICD-10-CM | POA: Diagnosis not present

## 2017-07-16 DIAGNOSIS — Z01818 Encounter for other preprocedural examination: Secondary | ICD-10-CM

## 2017-07-16 DIAGNOSIS — Z7901 Long term (current) use of anticoagulants: Secondary | ICD-10-CM

## 2017-07-16 NOTE — Patient Instructions (Signed)
If you are age 67 or older, your body mass index should be between 23-30. Your Body mass index is 37.1 kg/m. If this is out of the aforementioned range listed, please consider follow up with your Primary Care Provider.  If you are age 42 or younger, your body mass index should be between 19-25. Your Body mass index is 37.1 kg/m. If this is out of the aformentioned range listed, please consider follow up with your Primary Care Provider.  '  You have been scheduled for a colonoscopy. Please follow written instructions given to you at your visit today.  Please pick up your prep supplies at the pharmacy within the next 1-3 days. If you use inhalers (even only as needed), please bring them with you on the day of your procedure. Your physician has requested that you go to www.startemmi.com and enter the access code given to you at your visit today. This web site gives a general overview about your procedure. However, you should still follow specific instructions given to you by our office regarding your preparation for the procedure.

## 2017-07-16 NOTE — Progress Notes (Addendum)
Chief Complaint: Consult for screening colonoscopy in a patient on chronic anticoagulation  HPI:    Randy Shepherd is a 67 year old Caucasian male, who is previously followed with Dr. Carlean Purl, with a past medical history of stroke in December on Plavix (last EF 65-70% 04/11/17), who was referred to me by [facility] for a preprocedural visit for screening colonoscopy in a patient on chronic anticoagulation.     Last colonoscopy 2006 Dr. Carlean Purl showed internal and external hemorrhoids and was otherwise normal.  Repeat recommended in 10 years.    Today, tells me that he had a recent stroke in December and has since been told by the [place] that he is due for his screening colonoscopy.  He has been on Plavix over the past 3 months and has never had to stop this medication.  He denies any GI complaints today.    Denies fever, chills, weight loss, change in bowel habits, abdominal pain, heartburn, reflux, nausea, vomiting, bright red blood in his stools or melena.  Past Medical History:  Diagnosis Date  . Abscess 10/07/2012   RT KNEE  . Degenerative disc disease   . Depression   . DJD (degenerative joint disease)   . High cholesterol   . Hypertension   . Hypothyroidism   . Stroke [facility])     Past Surgical History:  Procedure Laterality Date  . APPENDECTOMY    . IR ANGIO INTRA EXTRACRAN SEL COM CAROTID INNOMINATE BILAT MOD SED  04/11/2017  . IR ANGIO VERTEBRAL SEL SUBCLAVIAN INNOMINATE UNI R MOD SED  04/11/2017  . IR ANGIO VERTEBRAL SEL VERTEBRAL UNI L MOD SED  04/11/2017  . IR RADIOLOGIST EVAL & MGMT  05/23/2017  . LOOP RECORDER INSERTION N/A 04/13/2017   Procedure: LOOP RECORDER INSERTION;  Surgeon: Thompson Grayer, MD;  Location: [facility];  Service: Cardiovascular;  Laterality: N/A;  . RADIOLOGY WITH ANESTHESIA N/A 04/11/2017   Procedure: IR WITH ANESTHESIA;  Surgeon: Luanne Bras, MD;  Location: [facility];  Service: Radiology;  Laterality: N/A;    Current Outpatient  Medications  Medication Sig Dispense Refill  . amLODipine (NORVASC) 10 MG tablet Take 10 mg by mouth daily.     . ARIPiprazole (ABILIFY) 5 MG tablet Take 5 mg by mouth daily.    . benazepril (LOTENSIN) 20 MG tablet Take 1 tablet (20 mg total) by mouth 2 (two) times daily. 60 tablet 1  . buPROPion (WELLBUTRIN SR) 150 MG 12 hr tablet Take 150 mg by mouth 2 (two) times daily.    . clopidogrel (PLAVIX) 75 MG tablet Take 1 tablet (75 mg total) by mouth daily. 30 tablet 1  . gabapentin (NEURONTIN) 300 MG capsule Take 300-600 mg by mouth See admin instructions. Take 300 mg by mouth four times daily, then take 600 mg by mouth at bedtime.    Marland Kitchen levothyroxine (SYNTHROID, LEVOTHROID) 200 MCG tablet Take 200 mcg by mouth daily before breakfast.     . oxyCODONE (ROXICODONE) 15 MG immediate release tablet Take 15 mg by mouth every 6 (six) hours as needed for pain.    Marland Kitchen sertraline (ZOLOFT) 100 MG tablet Take 200 mg by mouth every morning.    . simvastatin (ZOCOR) 40 MG tablet Take 20 mg by mouth every evening.     No current facility-administered medications for this visit.     Allergies as of 07/16/2017  . (No Known Allergies)    Family History  Problem Relation Age of Onset  . Cancer Mother  type unknown  . Heart disease Brother   . Throat cancer Brother   . Heart disease Brother     Social History   Socioeconomic History  . Marital status: Married    Spouse name: Not on file  . Number of children: 3  . Years of education: Not on file  . Highest education level: Not on file  Social Needs  . Financial resource strain: Not on file  . Food insecurity - worry: Not on file  . Food insecurity - inability: Not on file  . Transportation needs - medical: Not on file  . Transportation needs - non-medical: Not on file  Occupational History  . Occupation: Geophysicist/field seismologist  Tobacco Use  . Smoking status: Former Smoker    Last attempt to quit: 07/17/1987    Years since quitting: 30.0  . Smokeless  tobacco: Never Used  Substance and Sexual Activity  . Alcohol use: Yes    Alcohol/week: 1.8 oz    Types: 3 Cans of beer per week    Comment: DAILY BEER  . Drug use: No  . Sexual activity: Not on file  Other Topics Concern  . Not on file  Social History Narrative  . Not on file    Review of Systems:    Constitutional: No weight loss, fever or chills Skin: No rash Cardiovascular: No chest pain  Respiratory: No SOB Gastrointestinal: See HPI and otherwise negative Genitourinary: No dysuria Neurological: No headache Musculoskeletal: No new muscle or joint pain Hematologic: No bleeding  Psychiatric: No history of depression or anxiety   Physical Exam:  Vital signs: BP 132/80 (BP Location: Left Arm, Patient Position: Sitting, Cuff Size: Normal)   Pulse 88   Ht 5\' 8"  (1.727 m) Comment: height measured without shoes  Wt 244 lb (110.7 kg)   BMI 37.10 kg/m   Constitutional:   Pleasant overweight Caucasian male appears to be in NAD, Well developed, Well nourished, alert and cooperative Head:  Normocephalic and atraumatic. Eyes:   PEERL, EOMI. No icterus. Conjunctiva pink. Ears:  Normal auditory acuity. Neck:  Supple Throat: Oral cavity and pharynx without inflammation, swelling or lesion.  Respiratory: Respirations even and unlabored. Lungs clear to auscultation bilaterally.   No wheezes, crackles, or rhonchi.  Cardiovascular: Normal S1, S2. No MRG. Regular rate and rhythm. No peripheral edema, cyanosis or pallor.  Gastrointestinal:  Soft, nondistended, nontender. No rebound or guarding. Normal bowel sounds. No appreciable masses or hepatomegaly. Rectal:  Not performed.  Msk:  Symmetrical without gross deformities. Without edema, no deformity or joint abnormality.  Neurologic:  Alert and  oriented x4;  grossly normal neurologically.  Skin:   Dry and intact without significant lesions or rashes. Psychiatric: Demonstrates good judgement and reason without abnormal affect or  behaviors.  MOST RECENT LABS AND IMAGING: CBC    Component Value Date/Time   WBC 15.8 (H) 04/15/2017 0444   RBC 4.66 04/15/2017 0444   HGB 14.2 04/15/2017 0444   HCT 41.7 04/15/2017 0444   PLT 254 04/15/2017 0444   MCV 89.5 04/15/2017 0444   MCH 30.5 04/15/2017 0444   MCHC 34.1 04/15/2017 0444   RDW 14.0 04/15/2017 0444   LYMPHSABS 3.5 04/11/2017 0732   MONOABS 0.9 04/11/2017 0732   EOSABS 0.3 04/11/2017 0732   BASOSABS 0.0 04/11/2017 0732    CMP     Component Value Date/Time   NA 137 04/15/2017 0444   K 3.6 04/15/2017 0444   CL 103 04/15/2017 0444   CO2 25 04/15/2017  0444   GLUCOSE 103 (H) 04/15/2017 0444   BUN 13 04/15/2017 0444   CREATININE 1.12 04/15/2017 0444   CALCIUM 9.2 04/15/2017 0444   PROT 8.1 04/11/2017 0732   ALBUMIN 4.1 04/11/2017 0732   AST 49 (H) 04/11/2017 0732   ALT 51 04/11/2017 0732   ALKPHOS 67 04/11/2017 0732   BILITOT 0.9 04/11/2017 0732   GFRNONAA >60 04/15/2017 0444   GFRAA >60 04/15/2017 0444    Assessment: 1.  Preprocedure visit for screening colonoscopy: Last colonoscopy 2006, Dr. Carlean Purl, internal and external hemorrhoids, repeat recommended 10 years 2.  Chronic anticoagulation: On Plavix status post stroke in December 2018, last EF 65 and 70% at that time  Plan: 1.  Scheduled patient for screening colonoscopy in [place] with Dr. Carlean Purl.  Did discuss risks, benefits, limitations and alternatives and patient agrees to proceed. 2.  Patient advised to hold his Plavix for 5 days prior to time of his procedure.  [facility] for him.  We will go ahead and schedule the colonoscopy as far out as we can at this time, in 2-3 months to allow patient more time since recent stroke. 3.  Patient to follow in clinic per recommendations from Dr. Carlean Purl after time of procedure.  Ellouise Newer, PA-C [facility] 07/16/2017, 9:00 AM  Cc: [facility]   Agree with Ms.  Aayliah Rotenberry's evaluation and management. He is currently scheduled for 09/20/17 Dr. Leonie Shepherd of neurology is following him and I think best we ask him about the holding of clopidogrel. I will message him.  Gatha Mayer, MD, [facility]  Dr. Leonie Shepherd did think holding Plavix ok at this time as usu only does dual anti-PLT Tx x 3 mos  Gatha Mayer, MD, [facility]

## 2017-07-16 NOTE — Telephone Encounter (Signed)
Randy Shepherd [birth year] [id number]    We have scheduled the above named patient for a colonoscopy procedure. Our records show that he is on anticoagulation therapy.  Please advise as to whether the patient may come off their therapy of Plavix 5 days prior to their procedure which is scheduled for 09-20-17.  Please route your response to Tinnie Gens, CMA or fax response to [phone].     Sincerely,    [facility]

## 2017-07-27 NOTE — Telephone Encounter (Signed)
Sent to [place] [facility].

## 2017-08-30 NOTE — Telephone Encounter (Signed)
Spoke to patient and he states he has an appointment next week and will call and let me know what his PCP says about holding the Plavix.

## 2017-09-07 NOTE — Telephone Encounter (Signed)
Spoke to patient he states he had to cancel his appointment this week due to starting a new job, but he will call and see if he can get an answer.

## 2017-09-10 ENCOUNTER — Ambulatory Visit (INDEPENDENT_AMBULATORY_CARE_PROVIDER_SITE_OTHER): Payer: Medicare Other | Admitting: Adult Health

## 2017-09-10 ENCOUNTER — Encounter: Payer: Self-pay | Admitting: Adult Health

## 2017-09-10 VITALS — BP 140/67 | HR 87 | Ht 68.0 in | Wt 236.6 lb

## 2017-09-10 DIAGNOSIS — E785 Hyperlipidemia, unspecified: Secondary | ICD-10-CM | POA: Diagnosis not present

## 2017-09-10 DIAGNOSIS — I10 Essential (primary) hypertension: Secondary | ICD-10-CM

## 2017-09-10 DIAGNOSIS — I651 Occlusion and stenosis of basilar artery: Secondary | ICD-10-CM | POA: Diagnosis not present

## 2017-09-10 NOTE — Telephone Encounter (Signed)
Note is in Epic from [facility].

## 2017-09-10 NOTE — Patient Instructions (Addendum)
Continue clopidogrel 75 mg daily  and simvastatin  for secondary stroke prevention  Continue to follow up with PCP regarding cholesterol and blood pressure management   We will continue to monitor loop recorder  Continue to monitor blood pressure at home  You are cleared to go for colonoscopy on Sep 20, 2017. Recommend stopping plavix 5 days prior and you can restart immediately after procedure.  Maintain strict control of hypertension with blood pressure goal below 130/90, diabetes with hemoglobin A1c goal below 6.5% and cholesterol with LDL cholesterol (bad cholesterol) goal below 70 mg/dL. I also advised the patient to eat a healthy diet with plenty of whole grains, cereals, fruits and vegetables, exercise regularly and maintain ideal body weight.  Followup in the future with me in 6 months or call earlier if needed         Thank you for coming to see [place] at [facility]. I hope we have been able to provide you high quality care today.  You may receive a patient satisfaction survey over the next few weeks. We would appreciate your feedback and comments so that we may continue to improve ourselves and the health of our patients.

## 2017-09-10 NOTE — Telephone Encounter (Signed)
Pt said he is returning your call 

## 2017-09-10 NOTE — Progress Notes (Signed)
I agree with the above plan 

## 2017-09-10 NOTE — Progress Notes (Signed)
[facility] [address] [place]. [place] [address] [phone]       OFFICE FOLLOW-UP NOTE  [name] Date of Birth:  [birth year] Medical Record Number:  [id number]   HPI: [name] is a 67 year old Caucasian male seen today for the first office follow-up visit following hospital admission for stroke in December 2018. History is obtained from the patient and review of electronic medical records. I have personally reviewed imaging films. Randy Shepherd is a 67 y.o. male with a history of htn, hl, stroke who presents with recurrent episodes of right sided weakness that started last night. On awakening, he had difficulty getting out of bed and his right arm was numb. This improved, but he always had persistent numbness. On arrival to [facility], he had mild facial droop and right sided numbness, but no other symptoms. Following his CT, he developed sudden worsening of severe right sided weakness. He was taken for CTA/P which revealed a basilar occlusion. LKW: Last night, 7pm(when he went to bed).tpa given?: no, out of window. The chronicity of his basilar occlusion was unclear as he had multiple transient episodes at home of right-sided weakness facial numbness severe dysarthria lasting 10-15 minutes prior to coming to the hospital. He did have a history of prior stroke 14 years ago with no residual deficits. 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 MRI scan of the brain showed 4-5 tiny punctate foci of acute ischemia within bilateral cerebellum. CT perfusion scan was unremarkable. Diagnostic 4 vessel cerebral catheter and event performed by Dr. Estanislado Shepherd on 04/11/17 showed occluded at hypoplastic right vertebral artery distal to the PICA. 75% stenosis of the left vertebral basilar junction distal to the PICA and 50-75% stenosis of the left distal M1 middle cerebral artery.  There was retrograde flow noted in the upper one third of the basilar artery through right posterior communicating artery. Since the chronicity of his occlusion was unclear patient was not felt to be candidate for revascularization and only a diagnostic catheter and edematous performed. Contrast residue echo showed normal ejection fraction. LDL cholesterol was elevated at 104 mg percent. Hemoglobin A1c was 5.9. Patient was started on aspirin and Plavix after initially he was started on heparin for a couple of days till he remained stable. Patient by spreading the stroke atrial fibrillation trial and was randomized to the loop recorder placement.   06/13/17 visit: He states his done well since discharge. He is had practically no residual deficits. He occasionally may get dizzy when he tries to walk fast. His starting aspirin and Plavix without bruising or bleeding. His blood pressure is well controlled today it is 138/8 for in office. Is starting Zocor 40 mg well without muscle aches and pains. On inquiry he admits to snoring as well as having easy daytime sleepiness and falls asleep easily while riding as a passenger in a car. He has not been evaluated for sleep apnea. He does have a prior history of stroke in 2005 but is unable to give me any specific details. He has his medical follow-up at the [facility] clinic.  09/10/17 update: Patient returns today for a 3 month follow up.  Overall he has been doing well and has returned to all previous activities including working without complications.  Had complaints at previous visit regarding dizziness but this has since resolved.  Patient continues to take Plavix only without side effects of bleeding or bruising.  Continues to take simvastatin without side effects of myalgias.  Blood pressure today's visit 140/67 and per patient, this is typical.  When called for sleep apnea testing, he stated that he would prefer not to schedule appointment.  Spoke to patient  regarding this during appointment, and he stated that he would not be able to tolerate CPAP mask.  Advised patient that at times there are other options but patient continues to refuse and states that he believes he does not need any of it.  Loop recorder negative for atrial fibrillation thus far.  Denies new or worsening stroke/TIA symptoms.   ROS:   14 system review of systems is positive for no complaints and all other systems negative  PMH:  Past Medical History:  Diagnosis Date  . Abscess 10/07/2012   RT KNEE  . Degenerative disc disease   . Depression   . DJD (degenerative joint disease)   . High cholesterol   . Hypertension   . Hypothyroidism   . Stroke [facility])     Social History:  Social History   Socioeconomic History  . Marital status: Married    Spouse name: Not on file  . Number of children: 3  . Years of education: Not on file  . Highest education level: Not on file  Occupational History  . Occupation: Diplomatic Services operational officer  . Financial resource strain: Not on file  . Food insecurity:    Worry: Not on file    Inability: Not on file  . Transportation needs:    Medical: Not on file    Non-medical: Not on file  Tobacco Use  . Smoking status: Former Smoker    Last attempt to quit: 07/17/1987    Years since quitting: 30.1  . Smokeless tobacco: Never Used  Substance and Sexual Activity  . Alcohol use: Yes    Alcohol/week: 1.8 oz    Types: 3 Cans of beer per week    Comment: DAILY BEER  . Drug use: No  . Sexual activity: Not on file  Lifestyle  . Physical activity:    Days per week: Not on file    Minutes per session: Not on file  . Stress: Not on file  Relationships  . Social connections:    Talks on phone: Not on file    Gets together: Not on file    Attends religious service: Not on file    Active member of club or organization: Not on file    Attends meetings of clubs or organizations: Not on file    Relationship status: Not on file  . Intimate  partner violence:    Fear of current or ex partner: Not on file    Emotionally abused: Not on file    Physically abused: Not on file    Forced sexual activity: Not on file  Other Topics Concern  . Not on file  Social History Narrative  . Not on file    Medications:   Current Outpatient Medications on File Prior to Visit  Medication Sig Dispense Refill  . amLODipine (NORVASC) 10 MG tablet Take 10 mg by mouth daily.     . ARIPiprazole (ABILIFY) 5 MG tablet Take 5 mg by mouth daily.    . benazepril (LOTENSIN) 20 MG tablet Take 1 tablet (20 mg total) by mouth 2 (two) times daily. 60 tablet 1  . buPROPion (WELLBUTRIN SR) 150 MG 12 hr tablet Take 150 mg by mouth 2 (two) times daily.    Marland Kitchen  clopidogrel (PLAVIX) 75 MG tablet Take 1 tablet (75 mg total) by mouth daily. 30 tablet 1  . gabapentin (NEURONTIN) 300 MG capsule Take 300-600 mg by mouth See admin instructions. Take 300 mg by mouth four times daily, then take 600 mg by mouth at bedtime.    Marland Kitchen levothyroxine (SYNTHROID, LEVOTHROID) 200 MCG tablet Take 200 mcg by mouth daily before breakfast.     . oxyCODONE (ROXICODONE) 15 MG immediate release tablet Take 15 mg by mouth every 6 (six) hours as needed for pain.    Marland Kitchen sertraline (ZOLOFT) 100 MG tablet Take 200 mg by mouth every morning.    . simvastatin (ZOCOR) 40 MG tablet Take 20 mg by mouth every evening.     No current facility-administered medications on file prior to visit.     Allergies:  No Known Allergies  Physical Exam General: Mildly obese pleasant middle-age Caucasian male, seated, in no evident distress Head: head normocephalic and atraumatic.  Neck: supple with no carotid or supraclavicular bruits Cardiovascular: regular rate and rhythm, no murmurs Musculoskeletal: no deformity Skin:  no rash/petichiae Vascular:  Normal pulses all extremities Vitals:   09/10/17 0744  BP: 140/67  Pulse: 87   Neurologic Exam Mental Status: Awake and fully alert. Oriented to place and  time. Recent and remote memory intact. Attention span, concentration and fund of knowledge appropriate. Mood and affect appropriate.  Cranial Nerves: Fundoscopic exam reveals sharp disc margins. Pupils equal, briskly reactive to light. Extraocular movements full without nystagmus. Visual fields full to confrontation. Hearing intact. Facial sensation intact. Face, tongue, palate moves normally and symmetrically.  Motor: Normal bulk and tone. Normal strength in all tested extremity muscles. Sensory.: intact to touch ,pinprick .position and vibratory sensation.  Coordination: Rapid alternating movements normal in all extremities. Finger-to-nose and heel-to-shin performed accurately bilaterally. Gait and Station: Arises from chair without difficulty. Stance is normal. Gait demonstrates normal stride length and balance . Able to heel, toe and tandem walk without difficulty.  Reflexes: 1+ and symmetric. Toes downgoing.      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 Remote history of stroke in 2005. Multiple vascular risk factors of hypertension, hyperlipidemia, obesity and intracranial atherosclerosis.  At follow-up visit, patient is doing well and no atrial fibrillation found on loop recorder thus far.  Declining sleep study at this time.    PLAN: Continue clopidogrel 75 mg daily  and simvastatin  for secondary stroke prevention -f/u with PCP regarding HLD and HTN management  -monitor loop recorder -monitor blood pressure at home -Advised patient to inform us if he decides he would like to undergo sleep study -Maintain strict control of hypertension with blood pressure goal below 130/90, diabetes with hemoglobin A1c goal below 6.5% and cholesterol with LDL cholesterol (bad cholesterol) goal below 70 mg/dL. I also advised the patient to eat a healthy diet with plenty of whole grains, cereals,  fruits and vegetables, exercise regularly and maintain ideal body weight.  Followup in the future with me in 6 months or call earlier if needed  Greater than 50% of time during this 25 minute visit was spent on counseling,explanation of diagnosis of basilar occlusion, reviewed risk factor management of HTN and HLD, planning of further management, discussion with patient and family and coordination of care  Randy Shepherd, [facility]  [facility] [address] [place], [place] [address]  Phone [phone] Fax [phone]

## 2017-09-10 NOTE — Telephone Encounter (Signed)
Spoke to patient and he states he had an appointment this morning at [facility] and they state he can hold his Plavix 5 days before his procedure. I went over instructions with him again. Will call to get OV note from today to be scanned.

## 2017-09-10 NOTE — Telephone Encounter (Signed)
Patient states that [facility] told him that it would be ok to hold plavix but I told him that our office would need to be notified by [facility] regarding this.

## 2017-09-20 ENCOUNTER — Ambulatory Visit (AMBULATORY_SURGERY_CENTER): Payer: Medicare Other | Admitting: Internal Medicine

## 2017-09-20 ENCOUNTER — Other Ambulatory Visit: Payer: Self-pay

## 2017-09-20 ENCOUNTER — Encounter: Payer: Self-pay | Admitting: Internal Medicine

## 2017-09-20 VITALS — BP 140/80 | HR 65 | Temp 98.4°F | Resp 14 | Ht 68.0 in | Wt 244.0 lb

## 2017-09-20 DIAGNOSIS — D123 Benign neoplasm of transverse colon: Secondary | ICD-10-CM

## 2017-09-20 DIAGNOSIS — D124 Benign neoplasm of descending colon: Secondary | ICD-10-CM

## 2017-09-20 DIAGNOSIS — D126 Benign neoplasm of colon, unspecified: Secondary | ICD-10-CM

## 2017-09-20 DIAGNOSIS — Z1211 Encounter for screening for malignant neoplasm of colon: Secondary | ICD-10-CM | POA: Diagnosis not present

## 2017-09-20 DIAGNOSIS — K635 Polyp of colon: Secondary | ICD-10-CM

## 2017-09-20 DIAGNOSIS — D125 Benign neoplasm of sigmoid colon: Secondary | ICD-10-CM

## 2017-09-20 MED ORDER — SODIUM CHLORIDE 0.9 % IV SOLN: 500.0000 mL | Freq: Once | INTRAVENOUS | Status: DC

## 2017-09-20 NOTE — Patient Instructions (Addendum)
I found and removed 7 polyps. All look benign. I will let you know pathology results and when to have another routine colonoscopy by mail and/or My Chart.  I think it will be 3 years from now.  You also have a condition called diverticulosis - common and not usually a problem. Please read the handout provided.  Restart Plavix (clopidogrel) tomorrow.  I appreciate the opportunity to care for you.   YOU HAD AN ENDOSCOPIC PROCEDURE TODAY AT [facility]:   Refer to the procedure report that was given to you for any specific questions about what was found during the examination.  If the procedure report does not answer your questions, please call your gastroenterologist to clarify.  If you requested that your care partner not be given the details of your procedure findings, then the procedure report has been included in a sealed envelope for you to review at your convenience later.  YOU SHOULD EXPECT: Some feelings of bloating in the abdomen. Passage of more gas than usual.  Walking can help get rid of the air that was put into your GI tract during the procedure and reduce the bloating. If you had a lower endoscopy (such as a colonoscopy or flexible sigmoidoscopy) you may notice spotting of blood in your stool or on the toilet paper. If you underwent a bowel prep for your procedure, you may not have a normal bowel movement for a few days.  Please Note:  You might notice some irritation and congestion in your nose or some drainage.  This is from the oxygen used during your procedure.  There is no need for concern and it should clear up in a day or so.  SYMPTOMS TO REPORT IMMEDIATELY:   Following lower endoscopy (colonoscopy or flexible sigmoidoscopy):  Excessive amounts of blood in the stool  Significant tenderness or worsening of abdominal pains  Swelling of the abdomen that is new, acute  Fever of 100F or higher     For urgent or emergent issues, a  gastroenterologist can be reached at any hour by calling [phone].   DIET:  We do recommend a small meal at first, but then you may proceed to your regular diet.  Drink plenty of fluids but you should avoid alcoholic beverages for 24 hours.  ACTIVITY:  You should plan to take it easy for the rest of today and you should NOT DRIVE or use heavy machinery until tomorrow (because of the sedation medicines used during the test).    FOLLOW UP: Our staff will call the number listed on your records the next business day following your procedure to check on you and address any questions or concerns that you may have regarding the information given to you following your procedure. If we do not reach you, we will leave a message.  However, if you are feeling well and you are not experiencing any problems, there is no need to return our call.  We will assume that you have returned to your regular daily activities without incident.  If any biopsies were taken you will be contacted by phone or by letter within the next 1-3 weeks.  Please call us at [phone] if you have not heard about the biopsies in 3 weeks.    SIGNATURES/CONFIDENTIALITY: You and/or your care partner have signed paperwork which will be entered into your electronic medical record.  These signatures attest to the fact that that the information above on your After Visit Summary has  been reviewed and is understood.  Full responsibility of the confidentiality of this discharge information lies with you and/or your care-partner.

## 2017-09-20 NOTE — Progress Notes (Signed)
Called to room to assist during endoscopic procedure.  Patient ID and intended procedure confirmed with present staff. Received instructions for my participation in the procedure from the performing physician.  

## 2017-09-20 NOTE — Progress Notes (Signed)
For research purposes s/p CVA, pt has what he describes as a loop, not hooked to anything near heart.

## 2017-09-20 NOTE — Op Note (Signed)
[facility] Patient Name: Randy Shepherd Procedure Date: 09/20/2017 2:05 PM MRN: [id number] Endoscopist: Gatha Mayer , MD Age: 68 Referring MD:  Date of Birth: 01/16/1951 Gender: Male Account #: [id number] Procedure:                Colonoscopy Indications:              Screening for colorectal malignant neoplasm, Last                            colonoscopy: 2006 Medicines:                Propofol per Anesthesia, Monitored Anesthesia Care Procedure:                Pre-Anesthesia Assessment:                           - Prior to the procedure, a History and Physical                            was performed, and patient medications and                            allergies were reviewed. The patient's tolerance of                            previous anesthesia was also reviewed. The risks                            and benefits of the procedure and the sedation                            options and risks were discussed with the patient.                            All questions were answered, and informed consent                            was obtained. Prior Anticoagulants: The patient                            last took Plavix (clopidogrel) 5 days prior to the                            procedure. ASA Grade Assessment: III - A patient                            with severe systemic disease. After reviewing the                            risks and benefits, the patient was deemed in                            satisfactory condition to undergo the procedure.  After obtaining informed consent, the colonoscope                            was passed under direct vision. Throughout the                            procedure, the patient's blood pressure, pulse, and                            oxygen saturations were monitored continuously. The                            Colonoscope was introduced through the anus and                            advanced to  the the cecum, identified by                            appendiceal orifice and ileocecal valve. The                            colonoscopy was performed without difficulty. The                            patient tolerated the procedure well. The quality                            of the bowel preparation was good. The ileocecal                            valve, appendiceal orifice, and rectum were                            photographed. The bowel preparation used was                            Miralax. Scope In: 2:14:27 PM Scope Out: 2:37:55 PM Scope Withdrawal Time: 0 hours 19 minutes 3 seconds  Total Procedure Duration: 0 hours 23 minutes 28 seconds  Findings:                 The perianal and digital rectal examinations were                            normal. Pertinent negatives include normal prostate                            (size, shape, and consistency).                           Seven sessile polyps were found in the sigmoid                            colon, descending colon and transverse colon. The  polyps were 3 to 10 mm in size. These polyps were                            removed with a cold snare. Resection and retrieval                            were complete. Verification of patient                            identification for the specimen was done. Estimated                            blood loss was minimal.                           The exam was otherwise without abnormality on                            direct and retroflexion views. Complications:            No immediate complications. Estimated Blood Loss:     Estimated blood loss was minimal. Impression:               - Seven 3 to 10 mm polyps in the sigmoid colon, in                            the descending colon and in the transverse colon,                            removed with a cold snare. Resected and retrieved.                           - The examination was otherwise normal  on direct                            and retroflexion views. Recommendation:           - Patient has a contact number available for                            emergencies. The signs and symptoms of potential                            delayed complications were discussed with the                            patient. Return to normal activities tomorrow.                            Written discharge instructions were provided to the                            patient.                           -  Resume previous diet.                           - Continue present medications.                           - Resume Plavix (clopidogrel) at prior dose                            tomorrow.                           - Repeat colonoscopy is recommended. The                            colonoscopy date will be determined after pathology                            results from today's exam become available for                            review. Gatha Mayer, MD 09/20/2017 2:47:56 PM This report has been signed electronically.

## 2017-09-20 NOTE — Progress Notes (Signed)
To PACU, VSS. Report to RN.tb 

## 2017-09-21 ENCOUNTER — Telehealth: Payer: Self-pay | Admitting: *Deleted

## 2017-09-21 NOTE — Telephone Encounter (Signed)
  Follow up Call-  Call back number 09/20/2017  Post procedure Call Back phone  # [phone]  Permission to leave phone message No  Some recent data might be hidden     Patient questions:  Do you have a fever, pain , or abdominal swelling? No. Pain Score  0 *  Have you tolerated food without any problems? Yes.    Have you been able to return to your normal activities? Yes.    Do you have any questions about your discharge instructions: Diet   No. Medications  No. Follow up visit  No.  Do you have questions or concerns about your Care? No.  Actions: * If pain score is 4 or above: No action needed, pain <4.

## 2017-09-26 ENCOUNTER — Encounter: Payer: Self-pay | Admitting: Internal Medicine

## 2017-09-26 DIAGNOSIS — Z86010 Personal history of colonic polyps: Secondary | ICD-10-CM

## 2017-09-26 DIAGNOSIS — Z860101 Personal history of adenomatous and serrated colon polyps: Secondary | ICD-10-CM

## 2017-09-26 HISTORY — DX: Personal history of colonic polyps: Z86.010

## 2017-09-26 HISTORY — DX: Personal history of adenomatous and serrated colon polyps: Z86.0101

## 2017-09-26 NOTE — Progress Notes (Signed)
6 adenomas max 10 mm Recall 2022

## 2017-10-23 ENCOUNTER — Encounter: Payer: Medicare Other | Admitting: *Deleted

## 2017-10-23 NOTE — Progress Notes (Signed)
STROKE~AF Research study 6 month follow up visit completed. Modified rankin 0, EQ-5D 80%. Patient has stopped ASA as per instructed.

## 2018-01-08 ENCOUNTER — Telehealth: Payer: Self-pay | Admitting: *Deleted

## 2018-01-08 NOTE — Telephone Encounter (Addendum)
STROKE~AF research study call to instruct patient to complete a manuel transmission on the loop. Patient verbalized understanding.

## 2018-01-15 ENCOUNTER — Other Ambulatory Visit: Payer: Self-pay | Admitting: Internal Medicine

## 2018-03-04 ENCOUNTER — Ambulatory Visit (INDEPENDENT_AMBULATORY_CARE_PROVIDER_SITE_OTHER): Payer: Medicare Other | Admitting: *Deleted

## 2018-03-04 DIAGNOSIS — I6300 Cerebral infarction due to thrombosis of unspecified precerebral artery: Secondary | ICD-10-CM | POA: Diagnosis not present

## 2018-03-04 NOTE — Progress Notes (Signed)
Carelink Summary Report / Loop Recorder 

## 2018-03-12 NOTE — Progress Notes (Signed)
[facility] [address] [place]. [place] [address] [phone]       OFFICE FOLLOW-UP NOTE  Randy Shepherd Date of Birth:  06/16/1950 Medical Record Number:  [id number]   Chief Complaint  Patient presents with  . Follow-up    Rm 9, alone  . Cerebrovascular Accident    Doing well. No c/o.     HPI: Randy Shepherd is a 67 year old Caucasian male seen today for the first office follow-up visit following hospital admission for stroke in December 2018. History is obtained from the patient and review of electronic medical records. I have personally reviewed imaging films. Randy Shepherd is a 67 y.o. male with a history of htn, hl, stroke who presents with recurrent episodes of right sided weakness that started last night. On awakening, he had difficulty getting out of bed and his right arm was numb. This improved, but he always had persistent numbness. On arrival to [facility], he had mild facial droop and right sided numbness, but no other symptoms. Following his CT, he developed sudden worsening of severe right sided weakness. He was taken for CTA/P which revealed a basilar occlusion. LKW: Last night, 7pm(when he went to bed).tpa given?: no, out of window. The chronicity of his basilar occlusion was unclear as he had multiple transient episodes at home of right-sided weakness facial numbness severe dysarthria lasting 10-15 minutes prior to coming to the hospital. He did have a history of prior stroke 14 years ago with no residual deficits. 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 MRI scan of the brain showed 4-5 tiny punctate foci of acute ischemia within bilateral cerebellum. CT perfusion scan was unremarkable. Diagnostic 4 vessel cerebral catheter and event performed by Dr. Estanislado Pandy on 04/11/17 showed occluded at hypoplastic right vertebral artery distal to the PICA. 75%  stenosis of the left vertebral basilar junction distal to the PICA and 50-75% stenosis of the left distal M1 middle cerebral artery. There was retrograde flow noted in the upper one third of the basilar artery through right posterior communicating artery. Since the chronicity of his occlusion was unclear patient was not felt to be candidate for revascularization and only a diagnostic catheter and edematous performed. Contrast residue echo showed normal ejection fraction. LDL cholesterol was elevated at 104 mg percent. Hemoglobin A1c was 5.9. Patient was started on aspirin and Plavix after initially he was started on heparin for a couple of days till he remained stable. Patient by spreading the stroke atrial fibrillation trial and was randomized to the loop recorder placement.   06/13/17 visit PS/JV: He states his done well since discharge. He is had practically no residual deficits. He occasionally may get dizzy when he tries to walk fast. His starting aspirin and Plavix without bruising or bleeding. His blood pressure is well controlled today it is 138/8 for in office. Is starting Zocor 40 mg well without muscle aches and pains. On inquiry he admits to snoring as well as having easy daytime sleepiness and falls asleep easily while riding as a passenger in a car. He has not been evaluated for sleep apnea. He does have a prior history of stroke in 2005 but is unable to give me any specific details. He has his medical follow-up at the [facility] clinic.  09/10/17 update: Patient returns today for a 3 month follow up.  Overall he has been doing well and has returned to all previous activities  including working without complications.  Had complaints at previous visit regarding dizziness but this has since resolved.  Patient continues to take Plavix only without side effects of bleeding or bruising.  Continues to take simvastatin without side effects of myalgias.  Blood pressure today's visit 140/67 and per  patient, this is typical.  When called for sleep apnea testing, he stated that he would prefer not to schedule appointment.  Spoke to patient regarding this during appointment, and he stated that he would not be able to tolerate CPAP mask.  Advised patient that at times there are other options but patient continues to refuse and states that he believes he does not need any of it.  Loop recorder negative for atrial fibrillation thus far.  Denies new or worsening stroke/TIA symptoms.  Interval history 03/13/2018: Patient is being seen today for six-month follow-up.  Overall he is been doing well.  He continues on Plavix without side effects of bleeding or bruising.  Continues on simvastatin without side effects of myalgias.  He is currently being followed by the [place] in [place] and he is unsure if he is having recent lab work obtained regarding lipid panel.  Blood pressure today elevated for patient at 156/96.  He does does state he intermittently checks at home and typical SBP 1 35-1 40.  He continues to participate with cardiology research with loop recorder and atrial fibrillation.  Loop recorder has not shown atrial fibrillation thus far.  He continues to work without complication.  No further concerns at this time.  Denies new or worsening stroke/TIA symptoms.    ROS:   14 system review of systems is positive for no complaints and all other systems negative  PMH:  Past Medical History:  Diagnosis Date  . Abscess 10/07/2012   RT KNEE  . Degenerative disc disease   . Depression   . DJD (degenerative joint disease)   . High cholesterol   . Hx of adenomatous colonic polyps 09/26/2017  . Hypertension   . Hypothyroidism   . Stroke [facility])     Social History:  Social History   Socioeconomic History  . Marital status: Married    Spouse name: Not on file  . Number of children: 3  . Years of education: Not on file  . Highest education level: Not on file  Occupational History  . Occupation:  Diplomatic Services operational officer  . Financial resource strain: Not on file  . Food insecurity:    Worry: Not on file    Inability: Not on file  . Transportation needs:    Medical: Not on file    Non-medical: Not on file  Tobacco Use  . Smoking status: Former Smoker    Last attempt to quit: 07/17/1987    Years since quitting: 30.6  . Smokeless tobacco: Never Used  Substance and Sexual Activity  . Alcohol use: Yes    Alcohol/week: 3.0 standard drinks    Types: 3 Cans of beer per week    Comment: DAILY BEER  . Drug use: No  . Sexual activity: Not on file  Lifestyle  . Physical activity:    Days per week: Not on file    Minutes per session: Not on file  . Stress: Not on file  Relationships  . Social connections:    Talks on phone: Not on file    Gets together: Not on file    Attends religious service: Not on file    Active member of club or organization:  Not on file    Attends meetings of clubs or organizations: Not on file    Relationship status: Not on file  . Intimate partner violence:    Fear of current or ex partner: Not on file    Emotionally abused: Not on file    Physically abused: Not on file    Forced sexual activity: Not on file  Other Topics Concern  . Not on file  Social History Narrative  . Not on file    Medications:   Current Outpatient Medications on File Prior to Visit  Medication Sig Dispense Refill  . amLODipine (NORVASC) 10 MG tablet Take 10 mg by mouth daily.     . ARIPiprazole (ABILIFY) 5 MG tablet Take 5 mg by mouth daily.    . benazepril (LOTENSIN) 20 MG tablet Take 1 tablet (20 mg total) by mouth 2 (two) times daily. 60 tablet 1  . buPROPion (WELLBUTRIN SR) 150 MG 12 hr tablet Take 150 mg by mouth 2 (two) times daily.    . clopidogrel (PLAVIX) 75 MG tablet Take 1 tablet (75 mg total) by mouth daily. 30 tablet 1  . gabapentin (NEURONTIN) 300 MG capsule Take 300-600 mg by mouth See admin instructions. Take 300 mg by mouth four times daily, then take 600 mg  by mouth at bedtime.    Marland Kitchen levothyroxine (SYNTHROID, LEVOTHROID) 200 MCG tablet Take 200 mcg by mouth daily before breakfast.     . oxyCODONE (ROXICODONE) 15 MG immediate release tablet Take 15 mg by mouth every 6 (six) hours as needed for pain.    Marland Kitchen sertraline (ZOLOFT) 100 MG tablet Take 200 mg by mouth every morning.    . simvastatin (ZOCOR) 40 MG tablet Take 20 mg by mouth every evening.     Current Facility-Administered Medications on File Prior to Visit  Medication Dose Route Frequency Provider Last Rate Last Dose  . 0.9 %  sodium chloride infusion  500 mL Intravenous Once Gatha Mayer, MD        Allergies:  No Known Allergies  Physical Exam General: Mildly obese pleasant middle-age Caucasian male, seated, in no evident distress Head: head normocephalic and atraumatic.  Neck: supple with no carotid or supraclavicular bruits Cardiovascular: regular rate and rhythm, no murmurs Musculoskeletal: no deformity Skin:  no rash/petichiae Vascular:  Normal pulses all extremities Vitals:   03/13/18 0728  BP: (!) 156/96  Pulse: 75   Neurologic Exam Mental Status: Awake and fully alert. Oriented to place and time. Recent and remote memory intact. Attention span, concentration and fund of knowledge appropriate. Mood and affect appropriate.  Cranial Nerves: Fundoscopic exam deferred. Pupils equal, briskly reactive to light. Extraocular movements full without nystagmus. Visual fields full to confrontation. Hearing intact. Facial sensation intact. Face, tongue, palate moves normally and symmetrically.  Motor: Normal bulk and tone. Normal strength in all tested extremity muscles. Sensory.: intact to touch ,pinprick .position and vibratory sensation.  Coordination: Rapid alternating movements normal in all extremities. Finger-to-nose and heel-to-shin performed accurately bilaterally. Gait and Station: Arises from chair without difficulty. Stance is normal. Gait demonstrates normal stride length  and balance . Able to heel, toe and tandem walk without difficulty.  Reflexes: 1+ and symmetric. Toes downgoing.      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 Remote history of stroke in 2005. Multiple vascular risk factors of hypertension, hyperlipidemia, obesity and intracranial atherosclerosis.  Patient is being seen today for  a six-month follow-up and overall has been doing well from a stroke standpoint without residual deficits or recurring of symptoms.    PLAN: -Bilateral cerebellar infarcts: Continue risk factor management and monitoring.  Continue clopidogrel 75 mg daily  and simvastatin 20 mg for secondary stroke prevention.  Advised patient to continue to stay active with exercising at least 30 minutes of continuous activity daily along with ensuring he is maintaining a healthy diet with plenty of whole grains, cereals, fruits and vegetables.  He will continue to participate in cardiology research with continued monitoring of loop recorder for possible atrial fibrillation. -HLD: Continue simvastatin 20 mg daily along with [facility] [place] [facility] levels and prescribing of statin.  LDL goal <70 -HTN: Continue current regimen.  Blood pressure today mildly elevated at 156/96 patient does continue to monitor at home and typical SBP 1 35-1 40.  Advised him to continue to monitor intermittently at home along with continued follow-up with PCP for management and prescribing a medication.  BP goal <130/90   Followup in the future with me as needed as stable from stroke standpoint.  Patient was advised to call in the future with questions, concerns or need of future follow-up appointment.     Greater than 50% of time during this 25 minute visit was spent on counseling,explanation of diagnosis of basilar occlusion, reviewed risk factor management  of HTN and HLD, planning of further management, discussion with patient and family and coordination of care  Venancio Poisson, [facility]  [facility] [address] [place], [place] [address]  Phone [phone] Fax [phone]

## 2018-03-13 ENCOUNTER — Encounter: Payer: Self-pay | Admitting: Adult Health

## 2018-03-13 ENCOUNTER — Ambulatory Visit (INDEPENDENT_AMBULATORY_CARE_PROVIDER_SITE_OTHER): Payer: Medicare Other | Admitting: Adult Health

## 2018-03-13 VITALS — BP 156/96 | HR 75 | Ht 69.0 in | Wt 241.6 lb

## 2018-03-13 DIAGNOSIS — Z8673 Personal history of transient ischemic attack (TIA), and cerebral infarction without residual deficits: Secondary | ICD-10-CM

## 2018-03-13 DIAGNOSIS — I10 Essential (primary) hypertension: Secondary | ICD-10-CM | POA: Diagnosis not present

## 2018-03-13 DIAGNOSIS — E785 Hyperlipidemia, unspecified: Secondary | ICD-10-CM | POA: Diagnosis not present

## 2018-03-13 NOTE — Patient Instructions (Signed)
Continue clopidogrel 75 mg daily  and Zocor  for secondary stroke prevention  Continue to follow up with PCP regarding cholesterol and blood pressure management   Continue to stay active and maintain a healthy diet  Continue to monitor blood pressure at home  Maintain strict control of hypertension with blood pressure goal below 130/90, diabetes with hemoglobin A1c goal below 6.5% and cholesterol with LDL cholesterol (bad cholesterol) goal below 70 mg/dL. I also advised the patient to eat a healthy diet with plenty of whole grains, cereals, fruits and vegetables, exercise regularly and maintain ideal body weight.  Followup in the future with me as needed or call earlier if needed       Thank you for coming to see [place] at [facility]. I hope we have been able to provide you high quality care today.  You may receive a patient satisfaction survey over the next few weeks. We would appreciate your feedback and comments so that we may continue to improve ourselves and the health of our patients.

## 2018-03-16 NOTE — Progress Notes (Signed)
I agree with the above plan 

## 2018-04-05 ENCOUNTER — Ambulatory Visit (INDEPENDENT_AMBULATORY_CARE_PROVIDER_SITE_OTHER): Payer: Medicare Other

## 2018-04-05 DIAGNOSIS — I6300 Cerebral infarction due to thrombosis of unspecified precerebral artery: Secondary | ICD-10-CM

## 2018-04-08 NOTE — Progress Notes (Signed)
Carelink Summary Report / Loop Recorder 

## 2018-04-12 ENCOUNTER — Encounter: Payer: Medicare Other | Admitting: *Deleted

## 2018-04-12 VITALS — BP 143/75 | HR 85 | Temp 98.2°F | Resp 14 | Wt 240.0 lb

## 2018-04-12 DIAGNOSIS — Z006 Encounter for examination for normal comparison and control in clinical research program: Secondary | ICD-10-CM

## 2018-04-12 NOTE — Research (Signed)
Patient arrived to CV research for 12 month f/u visit on Stroke AF study. Vitals taken and recorded during check in.

## 2018-04-12 NOTE — Research (Signed)
STROKE~AF Research study 12 month research study visit completed. Patient denies any adverse events. Loop interrogated.

## 2018-04-27 LAB — CUP PACEART REMOTE DEVICE CHECK
Implantable Pulse Generator Implant Date: 20181214
MDC IDC SESS DTM: 20191103203951

## 2018-05-08 ENCOUNTER — Ambulatory Visit (INDEPENDENT_AMBULATORY_CARE_PROVIDER_SITE_OTHER): Payer: Medicare Other

## 2018-05-08 DIAGNOSIS — I6300 Cerebral infarction due to thrombosis of unspecified precerebral artery: Secondary | ICD-10-CM

## 2018-05-09 LAB — CUP PACEART REMOTE DEVICE CHECK
Date Time Interrogation Session: 20200108211043
Implantable Pulse Generator Implant Date: 20181214

## 2018-05-09 NOTE — Progress Notes (Signed)
Carelink Summary Report / Loop Recorder 

## 2018-05-19 LAB — CUP PACEART REMOTE DEVICE CHECK
Date Time Interrogation Session: 20191206210956
MDC IDC PG IMPLANT DT: 20181214

## 2018-06-10 ENCOUNTER — Ambulatory Visit (INDEPENDENT_AMBULATORY_CARE_PROVIDER_SITE_OTHER): Payer: Medicare Other

## 2018-06-10 DIAGNOSIS — I6300 Cerebral infarction due to thrombosis of unspecified precerebral artery: Secondary | ICD-10-CM

## 2018-06-11 LAB — CUP PACEART REMOTE DEVICE CHECK
MDC IDC PG IMPLANT DT: 20181214
MDC IDC SESS DTM: 20200210211052

## 2018-06-21 NOTE — Progress Notes (Signed)
Carelink Summary Report / Loop Recorder 

## 2018-07-15 ENCOUNTER — Other Ambulatory Visit: Payer: Self-pay

## 2018-07-15 ENCOUNTER — Ambulatory Visit (INDEPENDENT_AMBULATORY_CARE_PROVIDER_SITE_OTHER): Payer: No Typology Code available for payment source | Admitting: *Deleted

## 2018-07-15 DIAGNOSIS — I6300 Cerebral infarction due to thrombosis of unspecified precerebral artery: Secondary | ICD-10-CM

## 2018-07-16 LAB — CUP PACEART REMOTE DEVICE CHECK
Date Time Interrogation Session: 20200314214143
Implantable Pulse Generator Implant Date: 20181214

## 2018-07-17 ENCOUNTER — Emergency Department (HOSPITAL_COMMUNITY): Payer: Medicare Other

## 2018-07-17 ENCOUNTER — Inpatient Hospital Stay (HOSPITAL_COMMUNITY): Payer: Medicare Other

## 2018-07-17 ENCOUNTER — Other Ambulatory Visit: Payer: Self-pay

## 2018-07-17 ENCOUNTER — Encounter (HOSPITAL_COMMUNITY): Payer: Self-pay | Admitting: Emergency Medicine

## 2018-07-17 ENCOUNTER — Inpatient Hospital Stay (HOSPITAL_COMMUNITY)
Admission: EM | Admit: 2018-07-17 | Discharge: 2018-07-19 | DRG: 065 | Disposition: A | Discharge status: Home Health | Payer: Medicare Other | Attending: Internal Medicine | Admitting: Internal Medicine

## 2018-07-17 DIAGNOSIS — Z6837 Body mass index (BMI) 37.0-37.9, adult: Secondary | ICD-10-CM | POA: Diagnosis not present

## 2018-07-17 DIAGNOSIS — R29810 Facial weakness: Secondary | ICD-10-CM | POA: Diagnosis present

## 2018-07-17 DIAGNOSIS — Z79890 Hormone replacement therapy (postmenopausal): Secondary | ICD-10-CM | POA: Diagnosis not present

## 2018-07-17 DIAGNOSIS — I10 Essential (primary) hypertension: Secondary | ICD-10-CM | POA: Diagnosis present

## 2018-07-17 DIAGNOSIS — R531 Weakness: Secondary | ICD-10-CM | POA: Diagnosis not present

## 2018-07-17 DIAGNOSIS — F329 Major depressive disorder, single episode, unspecified: Secondary | ICD-10-CM | POA: Diagnosis present

## 2018-07-17 DIAGNOSIS — Z7902 Long term (current) use of antithrombotics/antiplatelets: Secondary | ICD-10-CM

## 2018-07-17 DIAGNOSIS — E039 Hypothyroidism, unspecified: Secondary | ICD-10-CM | POA: Diagnosis present

## 2018-07-17 DIAGNOSIS — I672 Cerebral atherosclerosis: Secondary | ICD-10-CM | POA: Diagnosis present

## 2018-07-17 DIAGNOSIS — Z79899 Other long term (current) drug therapy: Secondary | ICD-10-CM | POA: Diagnosis not present

## 2018-07-17 DIAGNOSIS — R260 Ataxic gait: Secondary | ICD-10-CM | POA: Diagnosis not present

## 2018-07-17 DIAGNOSIS — R1310 Dysphagia, unspecified: Secondary | ICD-10-CM | POA: Diagnosis present

## 2018-07-17 DIAGNOSIS — Y92009 Unspecified place in unspecified non-institutional (private) residence as the place of occurrence of the external cause: Secondary | ICD-10-CM | POA: Diagnosis not present

## 2018-07-17 DIAGNOSIS — I6389 Other cerebral infarction: Secondary | ICD-10-CM | POA: Diagnosis not present

## 2018-07-17 DIAGNOSIS — R7303 Prediabetes: Secondary | ICD-10-CM | POA: Diagnosis present

## 2018-07-17 DIAGNOSIS — I639 Cerebral infarction, unspecified: Secondary | ICD-10-CM | POA: Diagnosis present

## 2018-07-17 DIAGNOSIS — E785 Hyperlipidemia, unspecified: Secondary | ICD-10-CM | POA: Diagnosis present

## 2018-07-17 DIAGNOSIS — Z86010 Personal history of colonic polyps: Secondary | ICD-10-CM | POA: Diagnosis not present

## 2018-07-17 DIAGNOSIS — Z91128 Patient's intentional underdosing of medication regimen for other reason: Secondary | ICD-10-CM

## 2018-07-17 DIAGNOSIS — Z7401 Bed confinement status: Secondary | ICD-10-CM

## 2018-07-17 DIAGNOSIS — Z9181 History of falling: Secondary | ICD-10-CM | POA: Diagnosis not present

## 2018-07-17 DIAGNOSIS — E876 Hypokalemia: Secondary | ICD-10-CM | POA: Diagnosis not present

## 2018-07-17 DIAGNOSIS — R011 Cardiac murmur, unspecified: Secondary | ICD-10-CM | POA: Diagnosis not present

## 2018-07-17 DIAGNOSIS — T461X6A Underdosing of calcium-channel blockers, initial encounter: Secondary | ICD-10-CM | POA: Diagnosis present

## 2018-07-17 DIAGNOSIS — R4781 Slurred speech: Secondary | ICD-10-CM | POA: Diagnosis not present

## 2018-07-17 DIAGNOSIS — E7800 Pure hypercholesterolemia, unspecified: Secondary | ICD-10-CM | POA: Diagnosis present

## 2018-07-17 DIAGNOSIS — Z8673 Personal history of transient ischemic attack (TIA), and cerebral infarction without residual deficits: Secondary | ICD-10-CM | POA: Diagnosis not present

## 2018-07-17 DIAGNOSIS — I6329 Cerebral infarction due to unspecified occlusion or stenosis of other precerebral arteries: Secondary | ICD-10-CM | POA: Diagnosis not present

## 2018-07-17 DIAGNOSIS — R29702 NIHSS score 2: Secondary | ICD-10-CM | POA: Diagnosis present

## 2018-07-17 DIAGNOSIS — R471 Dysarthria and anarthria: Secondary | ICD-10-CM | POA: Diagnosis present

## 2018-07-17 DIAGNOSIS — Z87891 Personal history of nicotine dependence: Secondary | ICD-10-CM | POA: Diagnosis not present

## 2018-07-17 DIAGNOSIS — G4730 Sleep apnea, unspecified: Secondary | ICD-10-CM | POA: Diagnosis present

## 2018-07-17 DIAGNOSIS — I6300 Cerebral infarction due to thrombosis of unspecified precerebral artery: Secondary | ICD-10-CM | POA: Diagnosis not present

## 2018-07-17 DIAGNOSIS — I739 Peripheral vascular disease, unspecified: Secondary | ICD-10-CM | POA: Diagnosis present

## 2018-07-17 DIAGNOSIS — M1990 Unspecified osteoarthritis, unspecified site: Secondary | ICD-10-CM | POA: Diagnosis present

## 2018-07-17 DIAGNOSIS — R270 Ataxia, unspecified: Secondary | ICD-10-CM | POA: Diagnosis present

## 2018-07-17 DIAGNOSIS — E669 Obesity, unspecified: Secondary | ICD-10-CM | POA: Diagnosis present

## 2018-07-17 DIAGNOSIS — T464X6A Underdosing of angiotensin-converting-enzyme inhibitors, initial encounter: Secondary | ICD-10-CM | POA: Diagnosis present

## 2018-07-17 DIAGNOSIS — Z8249 Family history of ischemic heart disease and other diseases of the circulatory system: Secondary | ICD-10-CM

## 2018-07-17 DIAGNOSIS — N179 Acute kidney failure, unspecified: Secondary | ICD-10-CM | POA: Diagnosis present

## 2018-07-17 DIAGNOSIS — R2681 Unsteadiness on feet: Secondary | ICD-10-CM | POA: Diagnosis not present

## 2018-07-17 DIAGNOSIS — Z808 Family history of malignant neoplasm of other organs or systems: Secondary | ICD-10-CM

## 2018-07-17 DIAGNOSIS — E079 Disorder of thyroid, unspecified: Secondary | ICD-10-CM | POA: Diagnosis present

## 2018-07-17 LAB — DIFFERENTIAL
Abs Immature Granulocytes: 0.06 10*3/uL (ref 0.00–0.07)
BASOS ABS: 0.1 10*3/uL (ref 0.0–0.1)
Basophils Relative: 1 %
Eosinophils Absolute: 0.5 10*3/uL (ref 0.0–0.5)
Eosinophils Relative: 4 %
Immature Granulocytes: 1 %
Lymphocytes Relative: 26 %
Lymphs Abs: 3.2 10*3/uL (ref 0.7–4.0)
MONO ABS: 1.2 10*3/uL — AB (ref 0.1–1.0)
Monocytes Relative: 10 %
Neutro Abs: 7.2 10*3/uL (ref 1.7–7.7)
Neutrophils Relative %: 58 %

## 2018-07-17 LAB — COMPREHENSIVE METABOLIC PANEL
ALT: 34 U/L (ref 0–44)
AST: 35 U/L (ref 15–41)
Albumin: 3.8 g/dL (ref 3.5–5.0)
Alkaline Phosphatase: 60 U/L (ref 38–126)
Anion gap: 9 (ref 5–15)
BUN: 18 mg/dL (ref 8–23)
CALCIUM: 9.2 mg/dL (ref 8.9–10.3)
CO2: 24 mmol/L (ref 22–32)
Chloride: 106 mmol/L (ref 98–111)
Creatinine, Ser: 1.38 mg/dL — ABNORMAL HIGH (ref 0.61–1.24)
GFR calc Af Amer: >60 mL/min (ref >60)
GFR calc non Af Amer: 53 mL/min — ABNORMAL LOW (ref >60)
Glucose, Bld: 95 mg/dL (ref 70–99)
Potassium: 3.7 mmol/L (ref 3.5–5.1)
Sodium: 139 mmol/L (ref 135–145)
Total Bilirubin: 0.8 mg/dL (ref 0.3–1.2)
Total Protein: 6.8 g/dL (ref 6.5–8.1)

## 2018-07-17 LAB — CBC
HCT: 43.8 % (ref 39.0–52.0)
Hemoglobin: 14.4 g/dL (ref 13.0–17.0)
MCH: 29.3 pg (ref 26.0–34.0)
MCHC: 32.9 g/dL (ref 30.0–36.0)
MCV: 89.2 fL (ref 80.0–100.0)
Platelets: 293 10*3/uL (ref 150–400)
RBC: 4.91 MIL/uL (ref 4.22–5.81)
RDW: 13.7 % (ref 11.5–15.5)
WBC: 12.2 10*3/uL — AB (ref 4.0–10.5)
nRBC: 0 % (ref 0.0–0.2)

## 2018-07-17 LAB — PROTIME-INR
INR: 1.1 (ref 0.8–1.2)
Prothrombin Time: 13.6 seconds (ref 11.4–15.2)

## 2018-07-17 LAB — APTT: aPTT: 30 seconds (ref 24–36)

## 2018-07-17 LAB — ECHOCARDIOGRAM COMPLETE
Height: 68 in
Weight: 3954.17 oz

## 2018-07-17 LAB — I-STAT CREATININE, ED: Creatinine, Ser: 1.3 mg/dL — ABNORMAL HIGH (ref 0.61–1.24)

## 2018-07-17 MED ORDER — ARIPIPRAZOLE 10 MG PO TABS
5.0000 mg | ORAL_TABLET | Freq: Every day | ORAL | Status: DC
  Administered 2018-07-17 – 2018-07-19 (×3): 5 mg via ORAL
  Filled 2018-07-17 (×3): qty 1

## 2018-07-17 MED ORDER — BUPROPION HCL ER (SR) 150 MG PO TB12
150.0000 mg | ORAL_TABLET | Freq: Two times a day (BID) | ORAL | Status: DC
  Administered 2018-07-17 – 2018-07-19 (×4): 150 mg via ORAL
  Filled 2018-07-17 (×4): qty 1

## 2018-07-17 MED ORDER — SODIUM CHLORIDE 0.9% FLUSH
3.0000 mL | Freq: Once | INTRAVENOUS | Status: AC
  Administered 2018-07-17: 3 mL via INTRAVENOUS

## 2018-07-17 MED ORDER — LEVOTHYROXINE SODIUM 100 MCG PO TABS
200.0000 ug | ORAL_TABLET | Freq: Every day | ORAL | Status: DC
  Administered 2018-07-18 – 2018-07-19 (×2): 200 ug via ORAL
  Filled 2018-07-17 (×2): qty 2

## 2018-07-17 MED ORDER — CLOPIDOGREL BISULFATE 75 MG PO TABS
75.0000 mg | ORAL_TABLET | Freq: Every day | ORAL | Status: DC
  Administered 2018-07-17 – 2018-07-19 (×3): 75 mg via ORAL
  Filled 2018-07-17 (×3): qty 1

## 2018-07-17 MED ORDER — GABAPENTIN 300 MG PO CAPS
300.0000 mg | ORAL_CAPSULE | ORAL | Status: DC
  Administered 2018-07-17 – 2018-07-19 (×7): 300 mg via ORAL
  Filled 2018-07-17 (×7): qty 1

## 2018-07-17 MED ORDER — GABAPENTIN 300 MG PO CAPS: 300.0000 mg | ORAL_CAPSULE | ORAL | Status: DC

## 2018-07-17 MED ORDER — ACETAMINOPHEN 650 MG RE SUPP: 650.0000 mg | RECTAL | Status: DC | PRN

## 2018-07-17 MED ORDER — ACETAMINOPHEN 160 MG/5ML PO SOLN: 650.0000 mg | ORAL | Status: DC | PRN

## 2018-07-17 MED ORDER — ACETAMINOPHEN 325 MG PO TABS: 650.0000 mg | ORAL_TABLET | ORAL | Status: DC | PRN

## 2018-07-17 MED ORDER — IOHEXOL 350 MG/ML SOLN
60.0000 mL | Freq: Once | INTRAVENOUS | Status: AC | PRN
  Administered 2018-07-17: 60 mL via INTRAVENOUS

## 2018-07-17 MED ORDER — ATORVASTATIN CALCIUM 40 MG PO TABS
40.0000 mg | ORAL_TABLET | Freq: Every day | ORAL | Status: DC
  Administered 2018-07-17 – 2018-07-18 (×2): 40 mg via ORAL
  Filled 2018-07-17 (×2): qty 1

## 2018-07-17 MED ORDER — GABAPENTIN 300 MG PO CAPS
600.0000 mg | ORAL_CAPSULE | Freq: Every day | ORAL | Status: DC
  Administered 2018-07-17 – 2018-07-18 (×2): 600 mg via ORAL
  Filled 2018-07-17 (×2): qty 2

## 2018-07-17 MED ORDER — SERTRALINE HCL 100 MG PO TABS
200.0000 mg | ORAL_TABLET | Freq: Every day | ORAL | Status: DC
  Administered 2018-07-17 – 2018-07-19 (×3): 200 mg via ORAL
  Filled 2018-07-17 (×3): qty 2

## 2018-07-17 MED ORDER — STROKE: EARLY STAGES OF RECOVERY BOOK
Freq: Once | Status: AC
  Administered 2018-07-17: 15:00:00

## 2018-07-17 MED ORDER — SENNOSIDES-DOCUSATE SODIUM 8.6-50 MG PO TABS: 1.0000 | ORAL_TABLET | Freq: Every evening | ORAL | Status: DC | PRN

## 2018-07-17 NOTE — ED Notes (Signed)
[facility] called to activate code stroke 

## 2018-07-17 NOTE — ED Triage Notes (Signed)
Pt present to the ED by POV with new onset slurred speech, staggering, and dysarthria. Pt reports his last seen normal time was 0830 this date in regards to the slurred speech and dysarthria. Pt reports the staggering started about an hour after all his initial symptoms started, approximately 0930. Pt denies any weakness, pain, dizziness, or other neuro symptoms.

## 2018-07-17 NOTE — Progress Notes (Signed)
  Echocardiogram 2D Echocardiogram has been performed.  Randy Shepherd 07/17/2018, 4:16 PM

## 2018-07-17 NOTE — Consult Note (Signed)
Neurology Consultation  Reason for Consult: stroke Referring Physician: Sedonia Small  CC: slurred speech and facial droop  History is obtained from: patient and chart  HPI: Randy Shepherd is a 68 yo male  hx of stroke a year and half ago from which he made a full recovery from presents with a sudden onset of slurred speech and L facial droop that started when he was at work between 8-830am.     NIHSS 2    LKW: unclear if 8 or 830am tpa given?: no, unclear exact time symptoms started and could potentially be outside of TPA window. Given this uncertainty and low NIHSS no TPA given Premorbid modified Rankin scale (mRS): 0 0-Completely asymptomatic and back to baseline post-stroke 1-No significant post stroke disability and can perform usual duties with stroke symptoms 2-Slight disability-UNABLE to perform all activities but does not need assistance  3-Moderate disability-requires help but walks WITHOUT assistance 4-Needs assistance to walk and tend to bodily needs 5-Severe disability-bedridden, incontinent, needs constant attention 6- Death    ROS: A 14 point ROS was performed and is negative except as noted in the HPI.    Past Medical History:  Diagnosis Date  . Abscess 10/07/2012   RT KNEE  . Degenerative disc disease   . Depression   . DJD (degenerative joint disease)   . High cholesterol   . Hx of adenomatous colonic polyps 09/26/2017  . Hypertension   . Hypothyroidism   . Stroke [facility])      Family History  Problem Relation Age of Onset  . Cancer Mother        type unknown  . Heart disease Brother   . Throat cancer Brother   . Heart disease Brother      Social History:   reports that he quit smoking about 31 years ago. He has never used smokeless tobacco. He reports current alcohol use of about 3.0 standard drinks of alcohol per week. He reports that he does not use drugs.  Medications  Current Facility-Administered Medications:  .  0.9 %  sodium chloride  infusion, 500 mL, Intravenous, Once, Gatha Mayer, MD .  sodium chloride flush (NS) 0.9 % injection 3 mL, 3 mL, Intravenous, Once, Maudie Flakes, MD  Current Outpatient Medications:  .  amLODipine (NORVASC) 10 MG tablet, Take 10 mg by mouth daily. , Disp: , Rfl:  .  ARIPiprazole (ABILIFY) 5 MG tablet, Take 5 mg by mouth daily., Disp: , Rfl:  .  benazepril (LOTENSIN) 20 MG tablet, Take 1 tablet (20 mg total) by mouth 2 (two) times daily., Disp: 60 tablet, Rfl: 1 .  buPROPion (WELLBUTRIN SR) 150 MG 12 hr tablet, Take 150 mg by mouth 2 (two) times daily., Disp: , Rfl:  .  clopidogrel (PLAVIX) 75 MG tablet, Take 1 tablet (75 mg total) by mouth daily., Disp: 30 tablet, Rfl: 1 .  gabapentin (NEURONTIN) 300 MG capsule, Take 300-600 mg by mouth See admin instructions. Take 300 mg by mouth four times daily, then take 600 mg by mouth at bedtime., Disp: , Rfl:  .  levothyroxine (SYNTHROID, LEVOTHROID) 200 MCG tablet, Take 200 mcg by mouth daily before breakfast. , Disp: , Rfl:  .  oxyCODONE (ROXICODONE) 15 MG immediate release tablet, Take 15 mg by mouth every 6 (six) hours as needed for pain., Disp: , Rfl:  .  sertraline (ZOLOFT) 100 MG tablet, Take 200 mg by mouth every morning., Disp: , Rfl:  .  simvastatin (ZOCOR) 40 MG tablet,  Take 20 mg by mouth every evening., Disp: , Rfl:    Exam: Current vital signs: BP 137/77 (BP Location: Left Arm)   Pulse 72   Temp 98.9 F (37.2 C) (Oral)   Resp 18   Ht 5\' 8"  (1.727 m)   Wt 112.1 kg   SpO2 98%   BMI 37.58 kg/m  Vital signs in last 24 hours: Temp:  [98.9 F (37.2 C)] 98.9 F (37.2 C) (03/18 1253) Pulse Rate:  [72] 72 (03/18 1253) Resp:  [18] 18 (03/18 1253) BP: (137)/(77) 137/77 (03/18 1253) SpO2:  [98 %] 98 % (03/18 1253) Weight:  [112.1 kg] 112.1 kg (03/18 1218)  Physical Exam  Constitutional: Appears well-developed and well-nourished.  Psych: Affect appropriate to situation Eyes: No scleral injection HENT: No OP obstrucion Head:  Normocephalic.  Cardiovascular: Normal rate and regular rhythm.  Respiratory: Effort normal, non-labored breathing GI: Soft.  No distension. There is no tenderness.  Skin: WDI  Neuro: Mental Status: Patient is awake, alert, oriented to person, place, month, year, and situation. Patient is able to give a clear and coherent history. No signs of aphasia or neglect but does have mild to moderate dysarthria  Cranial Nerves: II: Visual Fields are full. Pupils are equal, round, and reactive to light.   III,IV, VI: EOMI without ptosis or diploplia.  V: Facial sensation is symmetric to temperature VII: L facial droop  VIII: hearing is intact to voice X: Uvula elevates symmetrically XI: Shoulder shrug is symmetric. XII: tongue is midline without atrophy or fasciculations.  Motor: Tone is normal. Bulk is normal. 5/5 strength was present in all four extremities.  Sensory: Sensation is symmetric to light touch and temperature in the arms and legs. Deep Tendon Reflexes: 2+ and symmetric in the biceps and patellae.  Plantars: Toes are downgoing bilaterally.  Cerebellar: FNF and HKS are intact bilaterally  NIHSS 2, for facial droop and slurred speech   Labs I have reviewed labs in epic and the results pertinent to this consultation are:   CBC    Component Value Date/Time   WBC 12.2 (H) 07/17/2018 1220   RBC 4.91 07/17/2018 1220   HGB 14.4 07/17/2018 1220   HCT 43.8 07/17/2018 1220   PLT 293 07/17/2018 1220   MCV 89.2 07/17/2018 1220   MCH 29.3 07/17/2018 1220   MCHC 32.9 07/17/2018 1220   RDW 13.7 07/17/2018 1220   LYMPHSABS 3.2 07/17/2018 1220   MONOABS 1.2 (H) 07/17/2018 1220   EOSABS 0.5 07/17/2018 1220   BASOSABS 0.1 07/17/2018 1220    CMP     Component Value Date/Time   NA 137 04/15/2017 0444   K 3.6 04/15/2017 0444   CL 103 04/15/2017 0444   CO2 25 04/15/2017 0444   GLUCOSE 103 (H) 04/15/2017 0444   BUN 13 04/15/2017 0444   CREATININE 1.30 (H) 07/17/2018 1237    CALCIUM 9.2 04/15/2017 0444   PROT 8.1 04/11/2017 0732   ALBUMIN 4.1 04/11/2017 0732   AST 49 (H) 04/11/2017 0732   ALT 51 04/11/2017 0732   ALKPHOS 67 04/11/2017 0732   BILITOT 0.9 04/11/2017 0732   GFRNONAA >60 04/15/2017 0444   GFRAA >60 04/15/2017 0444    Lipid Panel     Component Value Date/Time   CHOL 169 04/12/2017 0238   TRIG 119 04/12/2017 0238   HDL 41 04/12/2017 0238   CHOLHDL 4.1 04/12/2017 0238   VLDL 24 04/12/2017 0238   LDLCALC 104 (H) 04/12/2017 0238     Imaging  I have reviewed the images obtained:  CTH- no acute abnormality  CTA- No LVO     Assessment: 68 yo male with hx of stroke a year and half ago from which he made a full recovery from presents with a sudden onset of slurred speech and L facial droop that started when he was at work between 8-830am.  NIHSS 2  Not a TPA candidate due low NIHSS and outside of tpa window due to unclear exact time of onset.    Impression:  68 yo male with hx of stroke presents with stroke like symptoms.   Appears to have had a small stroke  CTA shows diffuse intracranial athero  Recommendations:  Admit to Neuro step down for stroke workup  - Recommend 1. HgbA1c, fasting lipid panel 2. MRI brain 3. PT consult, OT consult, Speech consult 4. Echocardiogram 5. 80 mg of Atorvistatin 6. Prophylactic therapy-Antiplatelet med: Continue home dose plavix. Would get TEG assay if available and patient will possibly need addition of ASA 81mg  daily x 90 days if MRI positive for stroke 7. Risk factor modification 8. Telemetry monitoring 9. Frequent neuro checks 10 NPO until passes stroke swallow screen

## 2018-07-17 NOTE — Progress Notes (Signed)
Patient arrived to [phone]. Alert and oriented x4. POC has been provided to patient and spouse. Nurse will continue to monitor.

## 2018-07-17 NOTE — ED Notes (Signed)
ED TO INPATIENT HANDOFF REPORT  ED Nurse Name and Phone #: Nicki Reaper [place] Name/Age/Gender Randy Shepherd 68 y.o. male Room/Bed: 016C/016C  Code Status   Code Status: Prior  Home/SNF/Other Home Patient oriented to: self, place, time and situation Is this baseline? Yes   Triage Complete: Triage complete  Chief Complaint Code Stroke  Triage Note Pt present to the ED by POV with new onset slurred speech, staggering, and dysarthria. Pt reports his last seen normal time was 0830 this date in regards to the slurred speech and dysarthria. Pt reports the staggering started about an hour after all his initial symptoms started, approximately 0930. Pt denies any weakness, pain, dizziness, or other neuro symptoms.    Allergies No Known Allergies  Level of Care/Admitting Diagnosis ED Disposition    ED Disposition Condition Comment   Admit  Hospital Area: [facility] [[id number]]  Level of Care: Progressive [102]  Diagnosis: CVA (cerebral vascular accident) [facility]) [[id number]]  Admitting Physician: Oda Kilts [[id number]]  Attending Physician: Oda Kilts [[id number]]  Estimated length of stay: past midnight tomorrow  Certification:: I certify this patient will need inpatient services for at least 2 midnights  PT Class (Do Not Modify): Inpatient [101]  PT Acc Code (Do Not Modify): Private [1]       B Medical/Surgery History Past Medical History:  Diagnosis Date  . Abscess 10/07/2012   RT KNEE  . Degenerative disc disease   . Depression   . DJD (degenerative joint disease)   . High cholesterol   . Hx of adenomatous colonic polyps 09/26/2017  . Hypertension   . Hypothyroidism   . Stroke [facility]    Past Surgical History:  Procedure Laterality Date  . APPENDECTOMY    . COLONOSCOPY    . IR ANGIO INTRA EXTRACRAN SEL COM CAROTID INNOMINATE BILAT MOD SED  04/11/2017  . IR ANGIO VERTEBRAL SEL SUBCLAVIAN INNOMINATE UNI R MOD SED  04/11/2017  . IR ANGIO  VERTEBRAL SEL VERTEBRAL UNI L MOD SED  04/11/2017  . IR RADIOLOGIST EVAL & MGMT  05/23/2017  . LOOP RECORDER INSERTION N/A 04/13/2017   Procedure: LOOP RECORDER INSERTION;  Surgeon: Thompson Grayer, MD;  Location: [place] [facility];  Service: Cardiovascular;  Laterality: N/A;  . lpop recorder    . RADIOLOGY WITH ANESTHESIA N/A 04/11/2017   Procedure: IR WITH ANESTHESIA;  Surgeon: Luanne Bras, MD;  Location: [place];  Service: Radiology;  Laterality: N/A;     A IV Location/Drains/Wounds Patient Lines/Drains/Airways Status   Active Line/Drains/Airways    Name:   Placement date:   Placement time:   Site:   Days:   Peripheral IV 07/17/18 Right Antecubital   07/17/18    1225    Antecubital   less than 1   Peripheral IV 07/17/18 Right Hand   07/17/18    1235    Hand   less than 1   Incision 10/06/12 Knee Right   10/06/12    -     2110          Intake/Output Last 24 hours No intake or output data in the 24 hours ending 07/17/18 1358  Labs/Imaging Results for orders placed or performed during the hospital encounter of 07/17/18 (from the past 48 hour(s))  Protime-INR     Status: None   Collection Time: 07/17/18 12:20 PM  Result Value Ref Range   Prothrombin Time 13.6 11.4 - 15.2 seconds   INR 1.1 0.8 - 1.2  Comment: (NOTE) INR goal varies based on device and disease states. Performed at [facility], [place] [address] [place], [place] [address]   APTT     Status: None   Collection Time: 07/17/18 12:20 PM  Result Value Ref Range   aPTT 30 24 - 36 seconds    Comment: Performed at [facility] [address], [place], [place] [address]  CBC     Status: Abnormal   Collection Time: 07/17/18 12:20 PM  Result Value Ref Range   WBC 12.2 (H) 4.0 - 10.5 K/uL   RBC 4.91 4.22 - 5.81 MIL/uL   Hemoglobin 14.4 13.0 - 17.0 g/dL   HCT 43.8 39.0 - 52.0 %   MCV 89.2 80.0 - 100.0 fL   MCH 29.3 26.0 - 34.0 pg   MCHC 32.9 30.0 - 36.0 g/dL   RDW 13.7 11.5 - 15.5 %   Platelets 293  150 - 400 K/uL   nRBC 0.0 0.0 - 0.2 %    Comment: Performed at [facility], [place] [address], [place], [place] [address]  Differential     Status: Abnormal   Collection Time: 07/17/18 12:20 PM  Result Value Ref Range   Neutrophils Relative % 58 %   Neutro Abs 7.2 1.7 - 7.7 K/uL   Lymphocytes Relative 26 %   Lymphs Abs 3.2 0.7 - 4.0 K/uL   Monocytes Relative 10 %   Monocytes Absolute 1.2 (H) 0.1 - 1.0 K/uL   Eosinophils Relative 4 %   Eosinophils Absolute 0.5 0.0 - 0.5 K/uL   Basophils Relative 1 %   Basophils Absolute 0.1 0.0 - 0.1 K/uL   Immature Granulocytes 1 %   Abs Immature Granulocytes 0.06 0.00 - 0.07 K/uL    Comment: Performed at [facility] [address] [place], [place] [address]  Comprehensive metabolic panel     Status: Abnormal   Collection Time: 07/17/18 12:20 PM  Result Value Ref Range   Sodium 139 135 - 145 mmol/L   Potassium 3.7 3.5 - 5.1 mmol/L   Chloride 106 98 - 111 mmol/L   CO2 24 22 - 32 mmol/L   Glucose, Bld 95 70 - 99 mg/dL   BUN 18 8 - 23 mg/dL   Creatinine, Ser 1.38 (H) 0.61 - 1.24 mg/dL   Calcium 9.2 8.9 - 10.3 mg/dL   Total Protein 6.8 6.5 - 8.1 g/dL   Albumin 3.8 3.5 - 5.0 g/dL   AST 35 15 - 41 U/L   ALT 34 0 - 44 U/L   Alkaline Phosphatase 60 38 - 126 U/L   Total Bilirubin 0.8 0.3 - 1.2 mg/dL   GFR calc non Af Amer 53 (L) >60 mL/min   GFR calc Af Amer >60 >60 mL/min   Anion gap 9 5 - 15    Comment: Performed at [facility] [address], [place],  [address]  I-stat Creatinine, ED     Status: Abnormal   Collection Time: 07/17/18 12:37 PM  Result Value Ref Range   Creatinine, Ser 1.30 (H) 0.61 - 1.24 mg/dL   Ct Angio Head W Or Wo Contrast  Result Date: 07/17/2018 CLINICAL DATA:  Focal neuro deficit less than 6 hours.  Stroke. EXAM: CT ANGIOGRAPHY HEAD AND NECK TECHNIQUE: Multidetector CT imaging of the head and neck was performed using the standard protocol during bolus administration of intravenous contrast.  Multiplanar CT image reconstructions and MIPs were obtained to evaluate the  vascular anatomy. Carotid stenosis measurements (when applicable) are obtained utilizing NASCET criteria, using the distal internal carotid diameter as the denominator. CONTRAST:  82mL OMNIPAQUE IOHEXOL 350 MG/ML SOLN COMPARISON:  None. FINDINGS: CTA NECK FINDINGS Aortic arch: Standard branching. Imaged portion shows no evidence of aneurysm or dissection. No significant stenosis of the major arch vessel origins. Right carotid system: Mild atherosclerotic disease proximal right internal carotid artery without significant stenosis. Remainder of the right carotid system widely patent Left carotid system: Atherosclerotic disease left carotid bifurcation. Left internal carotid artery patent without significant stenosis. Mild to moderate stenosis proximal external carotid artery on the left. Vertebral arteries: Hypoplastic right vertebral artery which ends in PICA. Small left vertebral artery which supplies the basilar. Hypoplastic vertebral artery due to fetal origin of the posterior cerebral artery bilaterally. Skeleton: Cervical spondylosis.  No acute skeletal abnormality. Other neck: Negative Upper chest: Negative Review of the MIP images confirms the above findings CTA HEAD FINDINGS Anterior circulation: Atherosclerotic calcification in the cavernous carotid bilaterally with mild stenosis bilaterally. Anterior and middle cerebral arteries patent bilaterally. Hypoplastic left A1 segment. Mild atherosclerotic disease in the left M1 segment. No significant stenosis in the anterior circulation. Posterior circulation: Right vertebral artery ends in PICA. Left vertebral artery supplies the basilar. Mild stenosis distal left vertebral artery. Basilar artery diffusely diseased with mild-to-moderate stenosis diffusely. Superior cerebellar arteries patent bilaterally. Posterior cerebral artery origin from the internal carotid artery bilaterally. Mild  stenosis left posterior cerebral artery P2 segment Venous sinuses: Not well opacified due to arterial phase scanning. Anatomic variants: None Delayed phase: Not performed Review of the MIP images confirms the above findings IMPRESSION: 1. Negative for large vessel occlusion 2. Mild atherosclerotic disease carotid bifurcation bilaterally without significant stenosis. Fetal origin of the posterior cerebral arteries bilaterally with small vertebral arteries and small basilar artery. Mild stenosis distal left vertebral artery and mild to moderate disease throughout the basilar. 3. Mild intracranial atherosclerotic disease involving the left middle cerebral artery and left posterior cerebral artery. Electronically Signed   By: Franchot Gallo M.D.   On: 07/17/2018 13:01   Ct Angio Neck W And/or Wo Contrast  Result Date: 07/17/2018 CLINICAL DATA:  Focal neuro deficit less than 6 hours.  Stroke. EXAM: CT ANGIOGRAPHY HEAD AND NECK TECHNIQUE: Multidetector CT imaging of the head and neck was performed using the standard protocol during bolus administration of intravenous contrast. Multiplanar CT image reconstructions and MIPs were obtained to evaluate the vascular anatomy. Carotid stenosis measurements (when applicable) are obtained utilizing NASCET criteria, using the distal internal carotid diameter as the denominator. CONTRAST:  53mL OMNIPAQUE IOHEXOL 350 MG/ML SOLN COMPARISON:  None. FINDINGS: CTA NECK FINDINGS Aortic arch: Standard branching. Imaged portion shows no evidence of aneurysm or dissection. No significant stenosis of the major arch vessel origins. Right carotid system: Mild atherosclerotic disease proximal right internal carotid artery without significant stenosis. Remainder of the right carotid system widely patent Left carotid system: Atherosclerotic disease left carotid bifurcation. Left internal carotid artery patent without significant stenosis. Mild to moderate stenosis proximal external carotid  artery on the left. Vertebral arteries: Hypoplastic right vertebral artery which ends in PICA. Small left vertebral artery which supplies the basilar. Hypoplastic vertebral artery due to fetal origin of the posterior cerebral artery bilaterally. Skeleton: Cervical spondylosis.  No acute skeletal abnormality. Other neck: Negative Upper chest: Negative Review of the MIP images confirms the above findings CTA HEAD FINDINGS Anterior circulation: Atherosclerotic calcification in the cavernous carotid bilaterally with mild stenosis bilaterally. Anterior  and middle cerebral arteries patent bilaterally. Hypoplastic left A1 segment. Mild atherosclerotic disease in the left M1 segment. No significant stenosis in the anterior circulation. Posterior circulation: Right vertebral artery ends in PICA. Left vertebral artery supplies the basilar. Mild stenosis distal left vertebral artery. Basilar artery diffusely diseased with mild-to-moderate stenosis diffusely. Superior cerebellar arteries patent bilaterally. Posterior cerebral artery origin from the internal carotid artery bilaterally. Mild stenosis left posterior cerebral artery P2 segment Venous sinuses: Not well opacified due to arterial phase scanning. Anatomic variants: None Delayed phase: Not performed Review of the MIP images confirms the above findings IMPRESSION: 1. Negative for large vessel occlusion 2. Mild atherosclerotic disease carotid bifurcation bilaterally without significant stenosis. Fetal origin of the posterior cerebral arteries bilaterally with small vertebral arteries and small basilar artery. Mild stenosis distal left vertebral artery and mild to moderate disease throughout the basilar. 3. Mild intracranial atherosclerotic disease involving the left middle cerebral artery and left posterior cerebral artery. Electronically Signed   By: Franchot Gallo M.D.   On: 07/17/2018 13:01   Ct Head Code Stroke Wo Contrast  Result Date: 07/17/2018 CLINICAL DATA:   Code stroke.  68 year old male EXAM: CT HEAD WITHOUT CONTRAST TECHNIQUE: Contiguous axial images were obtained from the base of the skull through the vertex without intravenous contrast. COMPARISON:  Head CT 04/13/2017 and earlier. FINDINGS: Brain: Cerebral volume remains normal. Small perivascular space at the left lentiform is unchanged. Gray-white matter differentiation elsewhere appears stable and within normal limits. No midline shift, ventriculomegaly, mass effect, evidence of mass lesion, intracranial hemorrhage or evidence of cortically based acute infarction. Vascular: Calcified atherosclerosis at the skull base. No suspicious intracranial vascular hyperdensity. Skull: Negative. Sinuses/Orbits: Visualized paranasal sinuses and mastoids are stable and well pneumatized. Other: Negative orbit and scalp soft tissues. ASPECTS ([place] Stroke Program Early CT Score) - Ganglionic level infarction (caudate, lentiform nuclei, internal capsule, insula, M1-M3 cortex): 7 - Supraganglionic infarction (M4-M6 cortex): 3 Total score (0-10 with 10 being normal): 10 IMPRESSION: 1. Stable and negative noncontrast CT appearance of the brain. 2. ASPECTS is 10. 3. These results were communicated to DR. ACristobal Goldmann at 12:32 pm on 07/17/2018 by text page via the [facility] messaging system. Electronically Signed   By: Genevie Ann M.D.   On: 07/17/2018 12:33    Pending Labs Unresulted Labs (From admission, onward)   None      Vitals/Pain Today's Vitals   07/17/18 1253 07/17/18 1300 07/17/18 1315 07/17/18 1330  BP: 137/77 138/81 140/78 132/80  Pulse: 72 74 72 65  Resp: 18 19 17 15   Temp: 98.9 F (37.2 C)     TempSrc: Oral     SpO2: 98% 98% 95% 99%  Weight:      Height:      PainSc:        Isolation Precautions No active isolations  Medications Medications  sodium chloride flush (NS) 0.9 % injection 3 mL (has no administration in time range)  iohexol (OMNIPAQUE) 350 MG/ML injection 60 mL (60 mLs Intravenous  Contrast Given 07/17/18 1236)    Mobility walks Low fall risk   Focused Assessments Neuro Assessment Handoff:  Swallow screen pass? Yes    NIH Stroke Scale ( + Modified Stroke Scale Criteria)  Interval: Initial Level of Consciousness (1a.)   : Alert, keenly responsive LOC Questions (1b. )   +: Answers both questions correctly LOC Commands (1c. )   + : Performs both tasks correctly Best Gaze (2. )  +: Normal Visual (3. )  +:  No visual loss Facial Palsy (4. )    : Partial paralysis  Motor Arm, Left (5a. )   +: No drift Motor Arm, Right (5b. )   +: No drift Motor Leg, Left (6a. )   +: No drift Motor Leg, Right (6b. )   +: No drift Limb Ataxia (7. ): Present in two limbs Sensory (8. )   +: Normal, no sensory loss Best Language (9. )   +: No aphasia Dysarthria (10. ): Mild-to-moderate dysarthria, patient slurs at least some words and, at worst, can be understood with some difficulty Extinction/Inattention (11.)   +: No Abnormality Modified SS Total  +: 0 Complete NIHSS TOTAL: 5 Last date known well: 07/17/18 Last time known well: 0830 Neuro Assessment: Exceptions to WDL Neuro Checks:   Initial (07/17/18 1230)  Last Documented NIHSS Modified Score: 0 (07/17/18 1235) Has TPA been given? No If patient is a Neuro Trauma and patient is going to OR before floor call report to [facility] nurse: [phone] or [phone]     R Recommendations: See Admitting Provider Note  Report given to:   Additional Notes:

## 2018-07-17 NOTE — Code Documentation (Signed)
68yo male presented to the [facility] via private vehicle at 1215. Patient has a h/o bilateral cerebellar infarcts in 03/2017, hypertension and hyperlipidemia. Patient reports he was LKW at 0830 when he had slurred speech while at work. He reports waking up at 0530 and denies deficits prior to 0830. He reports staggering gait that is new today following the slurred speech. Code stroke activated on patient arrival. Patient to CT where CT was completed, and he was met by the stroke team. CTA completed and negative for LVO. NIHSS 5, see documentation for details and code stroke times. Patient with left facial droop, moderate dysarthria and ataxia in BUE on exam. No acute stroke treatment. Patient to be admitted for stroke work-up. Handoff with ED RN Nicki Reaper.

## 2018-07-17 NOTE — ED Provider Notes (Signed)
[facility] Emergency Department Provider Note MRN:  [id number]  Arrival date & time: 07/18/18     Chief Complaint   Strokelike symptoms History of Present Illness   Randy Shepherd is a 68 y.o. year-old male with a history of stroke presenting to the ED with chief complaint of strokelike symptoms.  Sudden onset slurred speech as well as balance issues beginning this morning, patient estimates between 8 and 830.  This would be just under 4 hours since onset.  Denies numbness or weakness to the arms or legs.  Review of Systems  Positive for slurred speech, trouble with balance.  Patient's Health History    Past Medical History:  Diagnosis Date  . Abscess 10/07/2012   RT KNEE  . Degenerative disc disease   . Depression   . DJD (degenerative joint disease)   . High cholesterol   . Hx of adenomatous colonic polyps 09/26/2017  . Hypertension   . Hypothyroidism   . Stroke [facility])     Past Surgical History:  Procedure Laterality Date  . APPENDECTOMY    . COLONOSCOPY    . IR ANGIO INTRA EXTRACRAN SEL COM CAROTID INNOMINATE BILAT MOD SED  04/11/2017  . IR ANGIO VERTEBRAL SEL SUBCLAVIAN INNOMINATE UNI R MOD SED  04/11/2017  . IR ANGIO VERTEBRAL SEL VERTEBRAL UNI L MOD SED  04/11/2017  . IR RADIOLOGIST EVAL & MGMT  05/23/2017  . LOOP RECORDER INSERTION N/A 04/13/2017   Procedure: LOOP RECORDER INSERTION;  Surgeon: Thompson Grayer, MD;  Location: [facility];  Service: Cardiovascular;  Laterality: N/A;  . lpop recorder    . RADIOLOGY WITH ANESTHESIA N/A 04/11/2017   Procedure: IR WITH ANESTHESIA;  Surgeon: Luanne Bras, MD;  Location: [place];  Service: Radiology;  Laterality: N/A;    Family History  Problem Relation Age of Onset  . Cancer Mother        type unknown  . Heart disease Brother   . Throat cancer Brother   . Heart disease Brother     Social History   Socioeconomic History  . Marital status: Married    Spouse name: Not on file  .  Number of children: 3  . Years of education: Not on file  . Highest education level: Not on file  Occupational History  . Occupation: Diplomatic Services operational officer  . Financial resource strain: Not on file  . Food insecurity:    Worry: Not on file    Inability: Not on file  . Transportation needs:    Medical: Not on file    Non-medical: Not on file  Tobacco Use  . Smoking status: Former Smoker    Last attempt to quit: 07/17/1987    Years since quitting: 31.0  . Smokeless tobacco: Never Used  Substance and Sexual Activity  . Alcohol use: Yes    Alcohol/week: 3.0 standard drinks    Types: 3 Cans of beer per week    Comment: DAILY BEER  . Drug use: No  . Sexual activity: Not on file  Lifestyle  . Physical activity:    Days per week: Not on file    Minutes per session: Not on file  . Stress: Not on file  Relationships  . Social connections:    Talks on phone: Not on file    Gets together: Not on file    Attends religious service: Not on file    Active member of club or organization: Not on file  Attends meetings of clubs or organizations: Not on file    Relationship status: Not on file  . Intimate partner violence:    Fear of current or ex partner: Not on file    Emotionally abused: Not on file    Physically abused: Not on file    Forced sexual activity: Not on file  Other Topics Concern  . Not on file  Social History Narrative  . Not on file     Physical Exam  Vital Signs and Nursing Notes reviewed Vitals:   07/18/18 0121 07/18/18 0325  BP: 133/74 132/68  Pulse: 60   Resp: 20   Temp: 98.4 F (36.9 C) 97.8 F (36.6 C)  SpO2: 97%     CONSTITUTIONAL: Well-appearing, NAD NEURO:  Alert and oriented x 3, normal and symmetric strength and sensation, prominent dysarthria, no aphasia, no neglect, no visual field cuts EYES:  eyes equal and reactive ENT/NECK:  no LAD, no JVD CARDIO: Regular rate, well-perfused, normal S1 and S2 PULM:  CTAB no wheezing or rhonchi GI/GU:   normal bowel sounds, non-distended, non-tender MSK/SPINE:  No gross deformities, no edema SKIN:  no rash, atraumatic PSYCH:  Appropriate speech and behavior  Diagnostic and Interventional Summary    EKG Interpretation  Date/Time:  Wednesday July 17 2018 12:54:54 EDT Ventricular Rate:  73 PR Interval:    QRS Duration: 97 QT Interval:  432 QTC Calculation: 477 R Axis:   -13 Text Interpretation:  Sinus rhythm RSR' in V1 or V2, right VCD or RVH Borderline prolonged QT interval Confirmed by Gerlene Fee [id number]) on 07/17/2018 1:36:15 PM      Labs Reviewed  CBC - Abnormal; Notable for the following components:      Result Value   WBC 12.2 (*)    All other components within normal limits  DIFFERENTIAL - Abnormal; Notable for the following components:   Monocytes Absolute 1.2 (*)    All other components within normal limits  COMPREHENSIVE METABOLIC PANEL - Abnormal; Notable for the following components:   Creatinine, Ser 1.38 (*)    GFR calc non Af Amer 53 (*)    All other components within normal limits  HEMOGLOBIN A1C - Abnormal; Notable for the following components:   Hgb A1c MFr Bld 6.2 (*)    All other components within normal limits  LIPID PANEL - Abnormal; Notable for the following components:   HDL 38 (*)    LDL Cholesterol 123 (*)    All other components within normal limits  BASIC METABOLIC PANEL - Abnormal; Notable for the following components:   Potassium 3.2 (*)    Glucose, Bld 115 (*)    All other components within normal limits  CBC - Abnormal; Notable for the following components:   WBC 11.5 (*)    All other components within normal limits  I-STAT CREATININE, ED - Abnormal; Notable for the following components:   Creatinine, Ser 1.30 (*)    All other components within normal limits  PROTIME-INR  APTT  HIV ANTIBODY (ROUTINE TESTING W REFLEX)  CBG MONITORING, ED  I-STAT CREATININE, ED    MR BRAIN WO CONTRAST  Final Result    CT Angio Head W or Wo  Contrast  Final Result    CT Angio Neck W and/or Wo Contrast  Final Result    CT HEAD CODE STROKE WO CONTRAST  Final Result      Medications  ARIPiprazole (ABILIFY) tablet 5 mg (5 mg Oral Given 07/17/18 1526)  buPROPion [facility]  SR) 12 hr tablet 150 mg (150 mg Oral Given 07/17/18 2205)  sertraline (ZOLOFT) tablet 200 mg (200 mg Oral Given 07/17/18 1526)  levothyroxine (SYNTHROID, LEVOTHROID) tablet 200 mcg (200 mcg Oral Given 07/18/18 0644)  clopidogrel (PLAVIX) tablet 75 mg (75 mg Oral Given 07/17/18 1526)  acetaminophen (TYLENOL) tablet 650 mg (has no administration in time range)    Or  acetaminophen (TYLENOL) solution 650 mg (has no administration in time range)    Or  acetaminophen (TYLENOL) suppository 650 mg (has no administration in time range)  senna-docusate (Senokot-S) tablet 1 tablet (has no administration in time range)  atorvastatin (LIPITOR) tablet 40 mg (40 mg Oral Given 07/17/18 1802)  gabapentin (NEURONTIN) capsule 300 mg (300 mg Oral Given 07/17/18 1802)  gabapentin (NEURONTIN) capsule 600 mg (600 mg Oral Given 07/17/18 2205)  sodium chloride flush (NS) 0.9 % injection 3 mL (3 mLs Intravenous Given 07/17/18 1514)  iohexol (OMNIPAQUE) 350 MG/ML injection 60 mL (60 mLs Intravenous Contrast Given 07/17/18 1236)   stroke: mapping our early stages of recovery book ( Does not apply Given 07/17/18 1526)     Procedures Critical Care Critical Care Documentation Critical care time provided by me (excluding procedures): 35 minutes  Condition necessitating critical care: Concern for acute ischemic stroke  Components of critical care management: reviewing of prior records, laboratory and imaging interpretation, frequent re-examination and reassessment of vital signs, initiation of code stroke protocol, discussion with consulting services    ED Course and Medical Decision Making  I have reviewed the triage vital signs and the nursing notes.  Pertinent labs & imaging results  that were available during my care of the patient were reviewed by me and considered in my medical decision making (see below for details).  Concern for acute ischemic stroke given dysarthria and ataxia, estimated last known normal between 8 and 830.  I evaluated the patient at 1215, possibly within the 4-hour window, therefore code stroke protocol was initiated.  No visual field cuts, no aphasia, no neglect.  Admitted to hospitalist service for further care.  Barth Kirks. [place]onia Small, [place] [email or web address]  Final Clinical Impressions(s) / ED Diagnoses     ICD-10-CM   1. Acute ischemic stroke [facility]) I63.9     ED Discharge Orders    None         Maudie Flakes, MD 07/18/18 [phone]

## 2018-07-17 NOTE — Progress Notes (Signed)
SLP Cancellation Note  Patient Details Name: Randy Shepherd MRN: [id number] DOB: October 18, 1950   Cancelled treatment:       Reason Eval/Treat Not Completed: Patient at procedure or test/unavailable. Pt off unit at this time for MRI. SLP will re-attempt as able.   Sejla Marzano I. Hardin Negus, [place], [place] Office number [phone] Pager [place] 07/17/2018, 4:41 PM

## 2018-07-17 NOTE — H&P (Signed)
Date: 07/17/2018               Patient Name:  Randy Shepherd MRN: [id number]  DOB: 07-01-1950 Age / Sex: 68 y.o., male   PCP: [facility]         Medical Service: Internal Medicine Teaching Service         Attending Physician: Dr. Rebeca Alert, Raynaldo Opitz, MD    First Contact: Dr. Sherry Ruffing Pager: [phone]  Second Contact: Dr. Philipp Ovens Pager: [phone]       After Hours (After 5p/  First Contact Pager: [phone]  weekends / holidays): Second Contact Pager: [phone]   Chief Complaint: Slurred speech and facial droop  History of Present Illness: This is a 68 year old male with a history of hypothyroidism, CVA with no residual findings, HTN, HLD who presented with slurred speech, left facial droop, and balance issues. 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 He reports that he ran out of all of his medications about 2 weeks ago. He states that his symptoms are different then his previous strokes, he has never received tPA or had any neurosurgical interventions. He denies any other symptoms, including fevers, chills, nausea, vomiting, headaches, chest pain, shortness of breath, or palpitations. He denies any recent travel or sick contacts.    In the ED he was hemodynamically stable, labs were significant for a leukocytosis and elevated Cr to 1.38 (baseline around 1.1). CT head showed stable and negative non-contrast CT head. CTA H+N showed no large vesel occlusion, mild atherosclerotic disease carotid bifurcation bilaterally with no significant stenosis, mild stenosis of the distal left vertebral artery and mild/mod disease in the basilar, mild intracranial atherosclerotic disease in left mid cerebral artery and left posterior cerebral artery. Neurology was consulted and tPA was not given due to being out of time window, they recommended admission to neuro step down, continuing home plavix and addition of ASA 81 mg  daily if MRI is positive. Patient was admitted to internal medicine.   Meds:  Current Facility-Administered Medications for the 07/17/18 encounter [facility] Encounter)  Medication  . 0.9 %  sodium chloride infusion   Current Meds  Medication Sig  . amLODipine (NORVASC) 10 MG tablet Take 10 mg by mouth daily.   . ARIPiprazole (ABILIFY) 5 MG tablet Take 5 mg by mouth daily.  . benazepril (LOTENSIN) 20 MG tablet Take 1 tablet (20 mg total) by mouth 2 (two) times daily.  Marland Kitchen buPROPion (WELLBUTRIN SR) 150 MG 12 hr tablet Take 150 mg by mouth 2 (two) times daily.  . clopidogrel (PLAVIX) 75 MG tablet Take 1 tablet (75 mg total) by mouth daily.  Marland Kitchen gabapentin (NEURONTIN) 300 MG capsule Take 300-600 mg by mouth See admin instructions. Take 300 mg by mouth four times daily, then take 600 mg by mouth at bedtime.  Marland Kitchen levothyroxine (SYNTHROID, LEVOTHROID) 200 MCG tablet Take 200 mcg by mouth daily before breakfast.   . oxyCODONE (ROXICODONE) 15 MG immediate release tablet Take 15 mg by mouth every 6 (six) hours as needed for pain.  Marland Kitchen sertraline (ZOLOFT) 100 MG tablet Take 200 mg by mouth every morning.  . simvastatin (ZOCOR) 40 MG tablet Take 20 mg by mouth daily.      Allergies: Allergies as of 07/17/2018  . (No Known Allergies)   Past Medical History:  Diagnosis Date  . Abscess 10/07/2012   RT KNEE  .  Degenerative disc disease   . Depression   . DJD (degenerative joint disease)   . High cholesterol   . Hx of adenomatous colonic polyps 09/26/2017  . Hypertension   . Hypothyroidism   . Stroke [facility])     Family History: Mother had cancer, patient is unsure what type. Denies any other family history.   Social History: Quit smoking abour 30 years ago, drinks about 2-3 beers per day, denies any history of withdrawal, last drink was yesterday. Denies any illicit drug use. 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   Review of Systems: A complete ROS was negative except as  per HPI.   Physical Exam: Blood pressure 133/80, pulse 72, temperature 98.9 F (37.2 C), temperature source Oral, resp. rate 16, height 5\' 8"  (1.727 m), weight 112.1 kg, SpO2 99 %. Physical Exam  Constitutional: He is oriented to person, place, and time and well-developed, well-nourished, and in no distress.  HENT:  Head: Normocephalic and atraumatic.  Eyes: Pupils are equal, round, and reactive to light. Conjunctivae and EOM are normal.  Neck: Normal range of motion. Neck supple. No thyromegaly present.  Cardiovascular: Normal rate and regular rhythm.  Murmur (systolic murmur 3/6 heard best in 2nd intercostal space) heard. Pulmonary/Chest: Effort normal and breath sounds normal. No respiratory distress.  Abdominal: Soft. Bowel sounds are normal. He exhibits no distension.  Musculoskeletal: Normal range of motion.        General: No edema.  Neurological: He is alert and oriented to person, place, and time. He displays normal reflexes. A cranial nerve deficit (left sided facial droop, some dysarthria, otherwise CN 2-12 intact) is present. He exhibits normal muscle tone. Coordination normal.  Skin: Skin is warm and dry. No erythema.  Psychiatric: Mood and affect normal.    EKG: personally reviewed my interpretation is normal sinus rhythm, no ST changes  Assessment & Plan by Problem: Active Problems:   CVA (cerebral vascular accident) [facility])   CVA: This is a 68 year old male with a history of CVA, multiple risk factors including HTN, HLD, and intracranial atherosclerosis who presented after developing left sided facial droop, balance issues, and difficulty with speech. He reported that he has not been taking any of his meidcaitons for about 2 weeks, this may have been the cause of the stroke however will evaluate for other causes. On exam he was found to have similar findings. CT head showed no acute findings. CTA H+N showed diffuse atherosclerotic disease. Labs have been unremarkable.  Neurology evaluated in the ED and recommended admission to neuro step down, no tPA due to being out of window.  -- Neurology following, appreciate recommendation -- ASA 81 mg daily  -- Atorvastatin 80 mg daily -- Echocardiogram  -- A1C  -- Lipid panel  -- Tele monitoring  -- SLP eval -- PT/OT -- CBC and BMP in AM  Hypertension: Patient is on amlodipine 10 mg and benazepril 20 mg at home. He reports that he has been out of his medications for about 2 weeks. On admission his vitals were stable.  -Hold home medications for now, restart as needed  HLD: -He is on simvastatin at home, he was not taking this.  -Given his CVA will start a high intensity statin, atorvastatin  AKI: -Cr elevated to 1.3 on admission, baseline of around 1.0 however this was 2 years ago. Possibly pre-renal in nature. Will repeat BMP in AM.  -BMP in AM  Hypothyroidism: -Is on synthroid at home,  continue this  FEN: No fluids, replete lytes prn, heart healthy diet when passes SLP VTE ppx: SCDs Code Status: FULL    Dispo: Admit patient to Inpatient with expected length of stay greater than 2 midnights.  Signed: Asencion Noble, MD 07/17/2018, 3:06 PM  Pager: [phone]

## 2018-07-17 NOTE — ED Notes (Signed)
[place] [address]

## 2018-07-18 DIAGNOSIS — E876 Hypokalemia: Secondary | ICD-10-CM

## 2018-07-18 DIAGNOSIS — I10 Essential (primary) hypertension: Secondary | ICD-10-CM

## 2018-07-18 DIAGNOSIS — I6300 Cerebral infarction due to thrombosis of unspecified precerebral artery: Secondary | ICD-10-CM

## 2018-07-18 DIAGNOSIS — Z87891 Personal history of nicotine dependence: Secondary | ICD-10-CM

## 2018-07-18 DIAGNOSIS — E039 Hypothyroidism, unspecified: Secondary | ICD-10-CM

## 2018-07-18 DIAGNOSIS — R4781 Slurred speech: Secondary | ICD-10-CM

## 2018-07-18 DIAGNOSIS — Z7902 Long term (current) use of antithrombotics/antiplatelets: Secondary | ICD-10-CM

## 2018-07-18 DIAGNOSIS — Z79890 Hormone replacement therapy (postmenopausal): Secondary | ICD-10-CM

## 2018-07-18 DIAGNOSIS — N179 Acute kidney failure, unspecified: Secondary | ICD-10-CM

## 2018-07-18 DIAGNOSIS — E785 Hyperlipidemia, unspecified: Secondary | ICD-10-CM

## 2018-07-18 DIAGNOSIS — R29810 Facial weakness: Secondary | ICD-10-CM

## 2018-07-18 DIAGNOSIS — Z79899 Other long term (current) drug therapy: Secondary | ICD-10-CM

## 2018-07-18 DIAGNOSIS — I6329 Cerebral infarction due to unspecified occlusion or stenosis of other precerebral arteries: Secondary | ICD-10-CM

## 2018-07-18 DIAGNOSIS — Z8673 Personal history of transient ischemic attack (TIA), and cerebral infarction without residual deficits: Secondary | ICD-10-CM

## 2018-07-18 DIAGNOSIS — R531 Weakness: Secondary | ICD-10-CM

## 2018-07-18 DIAGNOSIS — R011 Cardiac murmur, unspecified: Secondary | ICD-10-CM

## 2018-07-18 LAB — BASIC METABOLIC PANEL
Anion gap: 10 (ref 5–15)
Anion gap: 12 (ref 5–15)
BUN: 13 mg/dL (ref 8–23)
BUN: 12 mg/dL (ref 8–23)
CO2: 24 mmol/L (ref 22–32)
CO2: 20 mmol/L — ABNORMAL LOW (ref 22–32)
Calcium: 8.9 mg/dL (ref 8.9–10.3)
Calcium: 9.1 mg/dL (ref 8.9–10.3)
Chloride: 105 mmol/L (ref 98–111)
Chloride: 108 mmol/L (ref 98–111)
Creatinine, Ser: 1.21 mg/dL (ref 0.61–1.24)
Creatinine, Ser: 1.2 mg/dL (ref 0.61–1.24)
GFR calc Af Amer: >60 mL/min (ref >60)
GFR calc Af Amer: >60 mL/min (ref >60)
GFR calc non Af Amer: >60 mL/min (ref >60)
GFR calc non Af Amer: >60 mL/min (ref >60)
Glucose, Bld: 115 mg/dL — ABNORMAL HIGH (ref 70–99)
Glucose, Bld: 110 mg/dL — ABNORMAL HIGH (ref 70–99)
Potassium: 3.2 mmol/L — ABNORMAL LOW (ref 3.5–5.1)
Potassium: 3.9 mmol/L (ref 3.5–5.1)
Sodium: 139 mmol/L (ref 135–145)
Sodium: 140 mmol/L (ref 135–145)

## 2018-07-18 LAB — LIPID PANEL
Cholesterol: 187 mg/dL (ref 0–200)
HDL: 38 mg/dL — ABNORMAL LOW (ref >40)
LDL Cholesterol: 123 mg/dL — ABNORMAL HIGH (ref 0–99)
Total CHOL/HDL Ratio: 4.9 RATIO
Triglycerides: 130 mg/dL (ref <150)
VLDL: 26 mg/dL (ref 0–40)

## 2018-07-18 LAB — HEMOGLOBIN A1C
Hgb A1c MFr Bld: 6.2 % — ABNORMAL HIGH (ref 4.8–5.6)
Mean Plasma Glucose: 131.24 mg/dL

## 2018-07-18 LAB — CBC
HCT: 42.4 % (ref 39.0–52.0)
Hemoglobin: 14 g/dL (ref 13.0–17.0)
MCH: 29.3 pg (ref 26.0–34.0)
MCHC: 33 g/dL (ref 30.0–36.0)
MCV: 88.7 fL (ref 80.0–100.0)
Platelets: 263 10*3/uL (ref 150–400)
RBC: 4.78 MIL/uL (ref 4.22–5.81)
RDW: 13.6 % (ref 11.5–15.5)
WBC: 11.5 10*3/uL — ABNORMAL HIGH (ref 4.0–10.5)
nRBC: 0 % (ref 0.0–0.2)

## 2018-07-18 LAB — HIV ANTIBODY (ROUTINE TESTING W REFLEX): HIV Screen 4th Generation wRfx: NONREACTIVE

## 2018-07-18 MED ORDER — POTASSIUM CHLORIDE 10 MEQ/100ML IV SOLN
10.0000 meq | INTRAVENOUS | Status: DC
  Administered 2018-07-18 (×2): 10 meq via INTRAVENOUS
  Filled 2018-07-18 (×4): qty 100

## 2018-07-18 MED ORDER — POTASSIUM CHLORIDE CRYS ER 20 MEQ PO TBCR
40.0000 meq | EXTENDED_RELEASE_TABLET | Freq: Once | ORAL | Status: AC
  Administered 2018-07-18: 40 meq via ORAL
  Filled 2018-07-18: qty 2

## 2018-07-18 NOTE — TOC Initial Note (Signed)
Transition of Care [facility]) - Initial/Assessment Note    Patient Details  Name: Randy Shepherd MRN: [id number] Date of Birth: 13-Apr-1951  [name]) CM/SW Contact:    Randy Friar, RN Phone Number: 07/18/2018, 3:06 PM  Clinical Narrative:                   Expected Discharge Plan: IP Rehab Facility Barriers to Discharge: Continued Medical Work up   Patient Goals and CMS Choice        Expected Discharge Plan and Services Expected Discharge Plan: [place]   Discharge Planning Services: CM Consult   Living arrangements for the past 2 months: Single Family Home(one level)                          Prior Living Arrangements/Services Living arrangements for the past 2 months: Single Family Home(one level) Lives with:: Spouse Patient language and need for interpreter reviewed:: Yes(no needs) Do you feel safe going back to the place where you live?: Yes        Care giver support system in place?: Yes (comment)(spouse) Current home services: DME(cane/ walker/ shower chair/ 3 in 1) Criminal Activity/Legal Involvement Pertinent to Current Situation/Hospitalization: No - Comment as needed  Activities of Daily Living Home Assistive Devices/Equipment: None ADL Screening (condition at time of admission) Patient's cognitive ability adequate to safely complete daily activities?: Yes Is the patient deaf or have difficulty hearing?: No Does the patient have difficulty seeing, even when wearing glasses/contacts?: No Does the patient have difficulty concentrating, remembering, or making decisions?: No Patient able to express need for assistance with ADLs?: Yes Does the patient have difficulty dressing or bathing?: No Independently performs ADLs?: Yes (appropriate for developmental age) Does the patient have difficulty walking or climbing stairs?: No Weakness of Legs: None Weakness of Arms/Hands: None  Permission Sought/Granted                   Emotional Assessment   Attitude/Demeanor/Rapport: Gracious Affect (typically observed): Accepting, Pleasant, Appropriate Orientation: : Oriented to Self, Oriented to Place, Oriented to  Time, Oriented to Situation   Psych Involvement: No (comment)  Admission diagnosis:  Code Stroke Patient Active Problem List   Diagnosis Date Noted  . Hypokalemia 07/18/2018  . CVA (cerebral vascular accident) ([place]) 07/17/2018  . Hx of adenomatous colonic polyps 09/26/2017  . 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 Stroke (cerebrum) ([place]) 04/11/2017  . Cellulitis and abscess of leg 10/06/2012  . Hypertension 10/06/2012  . Hyperlipidemia 10/06/2012  . History of stroke 10/06/2012  . Thyroid dysfunction 10/06/2012   PCP:  Clinic, [place]:   [place] [phone] [place] [address] Phone: [phone] Fax: [phone]     Social Determinants of Health (SDOH) Interventions  Has been noncompliant with medications. Pt states it is due to them needing to be renewed by his PCP. He states it was about 2 weeks getting this taken care of and receiving the meds.  Pt is without a car currently. Wife is looking for one and per pt is renting one in the interim.  Readmission Risk Interventions No flowsheet data found.

## 2018-07-18 NOTE — Evaluation (Signed)
Speech Language Pathology Evaluation Patient Details Name: Randy Shepherd MRN: [id number] DOB: 1950/09/07 Today's Date: 07/18/2018 Time: 0150-0220 SLP Time Calculation (min) (ACUTE ONLY): 30 min  Problem List:  Patient Active Problem List   Diagnosis Date Noted  . Hypokalemia 07/18/2018  . CVA (cerebral vascular accident) ([place]) 07/17/2018  . Hx of adenomatous colonic polyps 09/26/2017  . 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 Stroke (cerebrum) ([place]) 04/11/2017  . Cellulitis and abscess of leg 10/06/2012  . Hypertension 10/06/2012  . Hyperlipidemia 10/06/2012  . History of stroke 10/06/2012  . Thyroid dysfunction 10/06/2012   Past Medical History:  Past Medical History:  Diagnosis Date  . Abscess 10/07/2012   RT KNEE  . Degenerative disc disease   . Depression   . DJD (degenerative joint disease)   . High cholesterol   . Hx of adenomatous colonic polyps 09/26/2017  . Hypertension   . Hypothyroidism   . Stroke [facility])    Past Surgical History:  Past Surgical History:  Procedure Laterality Date  . APPENDECTOMY    . COLONOSCOPY    . IR ANGIO INTRA EXTRACRAN SEL COM CAROTID INNOMINATE BILAT MOD SED  04/11/2017  . IR ANGIO VERTEBRAL SEL SUBCLAVIAN INNOMINATE UNI R MOD SED  04/11/2017  . IR ANGIO VERTEBRAL SEL VERTEBRAL UNI L MOD SED  04/11/2017  . IR RADIOLOGIST EVAL & MGMT  05/23/2017  . LOOP RECORDER INSERTION N/A 04/13/2017   Procedure: LOOP RECORDER INSERTION;  Surgeon: Thompson Grayer, MD;  Location: [facility];  Service: Cardiovascular;  Laterality: N/A;  . lpop recorder    . RADIOLOGY WITH ANESTHESIA N/A 04/11/2017   Procedure: IR WITH ANESTHESIA;  Surgeon: Luanne Bras, MD;  Location: [facility];  Service: Radiology;  Laterality: N/A;   HPI:  Pt is a 68 year old male with a history of hypothyroidism, CVA with no residual findings, HTN, HLD who presented with slurred speech, left facial droop, and balance issues. MRI revealed developing acute infarction  within the right side of the pons. No other acute insult. Old small vessel infarctions present within the pons, cerebellum, left basal ganglia and cerebral hemispheric white matter.   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 Spouse reported that this is pt's 5th stroke and his speech has never been impacted so severely. 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 Expressive/receptive language and cognition are [facility] for tasks presented. He reports some concerns with word-finding, but this was not evident in neither structured word/picture descirption tasks or informal conversation about vocation. Provided strategies to increase speech intelligibility including overarticulation and increasing vocal intensity. Pt would be a good candidate for CIR or OP services following acute stay. SLP to f/u to address speech intelligibility in order to help pt return to ADLs. Discussed results and recommendations with pt and spouse who demonstrated understanding and agreement.    SLP Assessment  SLP Recommendation/Assessment: Patient needs continued Speech Lanaguage Pathology Services SLP Visit Diagnosis: Dysarthria and anarthria (R47.1)    Follow Up Recommendations  Inpatient Rehab;Outpatient SLP    Frequency and Duration min 2x/week         SLP Evaluation Cognition  Overall Cognitive Status: Within Functional Limits for tasks assessed Arousal/Alertness: Awake/alert Orientation Level: Oriented X4 Attention: Sustained Sustained Attention: Appears intact Awareness: Appears intact       Comprehension  Auditory Comprehension Overall Auditory  Comprehension: Appears within functional limits for tasks assessed Yes/No Questions: Within Functional Limits Commands: Within Functional Limits Conversation: Complex Visual  Recognition/Discrimination Discrimination: Within Function Limits Reading Comprehension Reading Status: Within funtional limits    Expression Expression Primary Mode of Expression: Verbal Verbal Expression Overall Verbal Expression: Appears within functional limits for tasks assessed Initiation: No impairment Level of Generative/Spontaneous Verbalization: Conversation Repetition: No impairment Naming: No impairment Pragmatics: No impairment Interfering Components: Speech intelligibility Written Expression Dominant Hand: Right Written Expression: Not tested   Oral / Motor  Oral Motor/Sensory Function Overall Oral Motor/Sensory Function: Mild impairment Facial Symmetry: Within Functional Limits Lingual ROM: Within Functional Limits Lingual Symmetry: Abnormal symmetry left Velum: Within Functional Limits Motor Speech Overall Motor Speech: Impaired Respiration: Within functional limits Phonation: Normal Resonance: Within functional limits Articulation: Impaired Level of Impairment: Word Intelligibility: Intelligibility reduced Word: 50-74% accurate Phrase: 25-49% accurate Sentence: 25-49% accurate Conversation: 25-49% accurate Motor Planning: Witnin functional limits Motor Speech Errors: Aware Effective Techniques: Increased vocal intensity;Over-articulate   GO                    Ellis Savage, Student SLP 07/18/2018, 2:58 PM

## 2018-07-18 NOTE — Progress Notes (Signed)
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 Pt currently requires min A for mobility and ADL and is a risk for falls due to deficits listed below. Feel pt would benefit from short term rehab at CIR to facilitate safe DC home at modified level. Will follow acutely.     07/18/18 1600  OT Visit Information  Last OT Received On 07/18/18  Assistance Needed +1  History of Present Illness 68 year old man with a history of CVA, hypothyroidism, HTN, and HLD who presented with slurred speech, left facial droop, and difficulty walking.  His symptoms started while he was at work at approximately 8 in the morning the day of admission.  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  Other people noticed the facial droop.  About an hour later, he noticed he was wobbly when he walked.  MRI shows developing infarction of the Right Pons.  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  Engineer, petroleum Expressive difficulties  Pain Assessment  Pain Assessment No/denies pain  Cognition  Arousal/Alertness Awake/alert  Behavior During Therapy WFL for tasks assessed/performed (a little apathetic)  Overall Cognitive Status Impaired/Different from baseline  Area of Impairment Safety/judgement;Awareness  Safety/Judgement Decreased awareness of safety;Decreased  awareness of deficits  Awareness Emergent  General Comments will further assess; decreased awareness of falling over/losing balance when attention challenged during ADL; asked to stay seated in chaie; pt stood without warning.  Upper Extremity Assessment  Upper Extremity Assessment LUE deficits/detail  LUE Deficits / Details moving through full ROM; generalized weakness; weaker distally; poor inhand manipulation skills but able to use functionally; "clumsy hand"  LUE Coordination decreased fine motor  Lower Extremity Assessment  Lower Extremity Assessment Defer to PT evaluation  Cervical / Trunk Assessment  Cervical / Trunk Assessment Other exceptions  Cervical / Trunk Exceptions L bias  ADL  Overall ADL's  Needs assistance/impaired  Eating/Feeding Set up  Grooming Set up;Standing  Upper Body Bathing Set up;Sitting  Lower Body Bathing Minimal assistance;Sit to/from stand  Upper Body Dressing  Set up;Supervision/safety;Sitting  Lower Body Dressing Minimal assistance;Sit to/from Retail buyer Minimal assistance;Ambulation  Functional mobility during ADLs Minimal assistance  General ADL Comments Pt not aware of having soiled gown on. wife states he had "an accident " last night  Vision- History  Baseline Vision/History Wears glasses  Wears Glasses At all times  Patient Visual Report No change from baseline  Vision- Assessment  Vision Assessment? Yes  Eye Alignment WFL  Ocular Range of Motion [facility]  Alignment/Gaze Preference WDL  Tracking/Visual Pursuits Able to track stimulus in all quads without difficulty  Saccades WFL  Convergence WFL  Visual Fields No apparent deficits  Additional Comments will further assess visual attention  Perception  Comments appears WFL  Praxis  Praxis-Other Comments WFL  Bed Mobility  Overal bed mobility Needs Assistance  Bed Mobility Supine to Sit  Supine to  sit Supervision  General bed mobility comments slower and mildly uncoordinated  transition  Transfers  Overall transfer level Needs assistance  Equipment used None  Transfers Sit to/from Stand  Sit to Stand Min assist  General transfer comment L bias; LOB toward L however pt able to self correct   Balance  Overall balance assessment Needs assistance  Sitting-balance support No upper extremity supported  Sitting balance-Leahy Scale Fair  Sitting balance - Comments sitting down or other challenge cause pt to "rare" back out of control until he could sit upright.  Postural control Posterior lean;Left lateral lean  Standing balance support No upper extremity supported  Standing balance-Leahy Scale Fair  Standing balance comment unsteady with stepping and other strategies to stay in balance.  OT - End of Session  Equipment Utilized During Treatment Gait belt  Activity Tolerance Patient tolerated treatment well  Patient left in chair;with call bell/phone within reach;with chair alarm set;with family/visitor present  Nurse Communication Mobility status  OT Assessment  OT Recommendation/Assessment Patient needs continued OT Services  OT Visit Diagnosis Unsteadiness on feet (R26.81);Other abnormalities of gait and mobility (R26.89);Muscle weakness (generalized) (M62.81)  OT Problem List Decreased strength;Impaired balance (sitting and/or standing);Decreased activity tolerance;Decreased safety awareness;Decreased knowledge of use of DME or AE;Obesity;Impaired UE functional use  OT Plan  OT Frequency (ACUTE ONLY) Min 2X/week  OT Treatment/Interventions (ACUTE ONLY) Self-care/ADL training;Therapeutic exercise;Neuromuscular education;DME and/or AE instruction;Therapeutic activities;Cognitive remediation/compensation;Patient/family education;Balance training  AM-PAC OT "6 Clicks" Daily Activity Outcome Measure (Version 2)  Help from another person eating meals? 4  Help from another person taking care of personal grooming? 3  Help from another person toileting, which includes  using toliet, bedpan, or urinal? 3  Help from another person bathing (including washing, rinsing, drying)? 3  Help from another person to put on and taking off regular upper body clothing? 3  Help from another person to put on and taking off regular lower body clothing? 3  6 Click Score 19  OT Recommendation  Recommendations for Other Services Rehab consult  Follow Up Recommendations CIR  OT Equipment 3 in 1 bedside commode  Individuals Consulted  Consulted and Agree with Results and Recommendations Patient;Family member/caregiver  Family Member Consulted wife  Acute Rehab OT Goals  Patient Stated Goal get home as soon as possible.  Get back to work.  OT Goal Formulation With patient  Time For Goal Achievement 08/01/18  Potential to Achieve Goals Good  OT Time Calculation  OT Start Time (ACUTE ONLY) 1522  OT Stop Time (ACUTE ONLY) 1550  OT Time Calculation (min) 28 min  OT General Charges  $OT Visit 1 Visit  OT Evaluation  $OT Eval Moderate Complexity 1 Mod  OT Treatments  $Self Care/Home Management  8-22 mins  Written Expression  Dominant Hand Right  Maurie Boettcher, OT/L   Acute OT Clinical Specialist [place] Pager [phone] Office [phone]

## 2018-07-18 NOTE — Progress Notes (Signed)
Nurse called to clarify potassium orders. Dr. Sherry Ruffing verbalized wanting to give 40 mEq PO of potassium and 4 run of IV potassium 10 mEq and recheck in the afternoon. Nurse will continue to monitor. [place]

## 2018-07-18 NOTE — Discharge Summary (Addendum)
Name: Randy Shepherd MRN: [id number] DOB: 08/29/1950 68 y.o. PCP: [facility]  Date of Admission: 07/17/2018 12:18 PM Date of Discharge: 07/19/2018. Attending Physician: Oda Kilts  Discharge Diagnosis: 1. Right pontine CVA 2. Hypokalemia 3. AKI 4. HTN 5. HLD 6. Hypothyroidism  Discharge Medications: Allergies as of 07/19/2018   No Known Allergies     Medication List    STOP taking these medications   simvastatin 40 MG tablet Commonly known as:  ZOCOR     TAKE these medications   amLODipine 10 MG tablet Commonly known as:  NORVASC Take 10 mg by mouth daily.   ARIPiprazole 5 MG tablet Commonly known as:  ABILIFY Take 5 mg by mouth daily.   aspirin EC 81 MG tablet Take 1 tablet (81 mg total) by mouth daily.   atorvastatin 40 MG tablet Commonly known as:  LIPITOR Take 1 tablet (40 mg total) by mouth daily at 6 PM.   benazepril 20 MG tablet Commonly known as:  LOTENSIN Take 1 tablet (20 mg total) by mouth 2 (two) times daily.   buPROPion 150 MG 12 hr tablet Commonly known as:  WELLBUTRIN SR Take 150 mg by mouth 2 (two) times daily.   clopidogrel 75 MG tablet Commonly known as:  PLAVIX Take 1 tablet (75 mg total) by mouth daily.   gabapentin 300 MG capsule Commonly known as:  NEURONTIN Take 300-600 mg by mouth See admin instructions. Take 300 mg by mouth four times daily, then take 600 mg by mouth at bedtime.   levothyroxine 200 MCG tablet Commonly known as:  SYNTHROID, LEVOTHROID Take 200 mcg by mouth daily before breakfast.   sertraline 100 MG tablet Commonly known as:  ZOLOFT Take 200 mg by mouth every morning.       Disposition and follow-up:   RandyCharly I Shepherd was discharged from [facility] in [place] condition.  At the hospital follow up visit please address:  1.  Right pontine CVA: Patient was offered CIR however he declined and wanted to go home, he was provided with home health. Please make  sure that he is getting therapy, especially speech therapy. Please make sure that he is taking his medications since this was likely the cause of his stroke. He was prescribed ASA and plavix for 3 weeks then just plavix alone.   Hypokalemia: Please recheck labs  HLD: Switched from simvastatin to atorvastatin, please make sure that he is taking this  HTN: Home BP medications were held, please check BP and restart as needed  2.  Labs / imaging needed at time of follow-up: CBC, BMP  3.  Pending labs/ test needing follow-up: None  Follow-up Appointments: Follow-up Information    [facility] Follow up in 4 week(s).   Specialty:  Neurology Why:  stroke clinic. office will call wtih appt date and time.  Contact information: [address] [place], [place] Schedule an appointment as soon as possible for a visit in 1 week(s).   Why:  Please contact to arrange hospital follow up Contact information: [place] [address] [phone]           Hospital Course by problem list: 1. Right pontine CVA: This is a 68 year old male with a history of prior CVAs, hypertension, hyperlipidemia, and hypothyroidism presented with new onset left-sided facial droop, difficulty speaking, and balance issues.  He had stopped taking  his medications for about 2 weeks prior to his symptoms.  Neurology was consulted and saw him in the ED, he was not in the TPA window so he was started on aspirin, clopidogrel and atorvastatin.  He had a CT head that showed no acute findings.  CTA neck showed diffuse atherosclerotic disease.  MRI showed acute infarction in the right side of the pons and small vessel ischemic changes.  Lipid panel showed cholesterol 187, triglycerides 130, HDL 38, LDL 123, and cholesterol/HDL ratio 4.9.  A1c is 6.5.  Echocardiogram showed no vegetations within normal EF.  PT  and OT evaluated and recommended CIR given his balance issues and dysarthria, but he declined.  I had an extensive discussion with him and his wife about the benefits of CIR especially after a stroke, patient is aware of these benefits and the consequences of returning home and the safety issues. He still wanted to return home.  We provided him with home health services.  Patient was continued on atorvastatin, aspirin and clopidogrel.  Patient was advised to take aspirin for only 3 weeks and to continue his atorvastatin and clopidogrel.  2. Hypokalemia: Potassium went down to 3.2.  This was repleted and potassium was 3.9 on discharge. 3. AKI: Creatinine elevated to 1.3 on admission, baseline of around 1 that was 2 years ago.  It improved to 1.2 on discharge. 4. HTN: Patient was on amlodipine 10 mg and benazepril 20 mg at home, and he can resume these home medications at discharge. 5. HLD: Patient was on simvastatin at home, we switched this to atorvastatin while inpatient and continued atorvastatin on discharge. 6. Hypothyroidism: We continued home Synthroid 200 MCG daily   Discharge Vitals:   BP 136/78 (BP Location: Left Arm)   Pulse 68   Temp 98 F (36.7 C) (Oral)   Resp 16   Ht 5\' 8"  (1.727 m)   Wt 112.1 kg   SpO2 98%   BMI 37.58 kg/m   Pertinent Labs, Studies, and Procedures:  CBC Latest Ref Rng & Units 07/18/2018 07/17/2018 04/15/2017  WBC 4.0 - 10.5 K/uL 11.5(H) 12.2(H) 15.8(H)  Hemoglobin 13.0 - 17.0 g/dL 14.0 14.4 14.2  Hematocrit 39.0 - 52.0 % 42.4 43.8 41.7  Platelets 150 - 400 K/uL 263 293 254   BMP Latest Ref Rng & Units 07/19/2018 07/18/2018 07/18/2018  Glucose 70 - 99 mg/dL 99 110(H) 115(H)  BUN 8 - 23 mg/dL 13 12 13   Creatinine 0.61 - 1.24 mg/dL 1.22 1.20 1.21  Sodium 135 - 145 mmol/L 141 140 139  Potassium 3.5 - 5.1 mmol/L 3.9 3.9 3.2(L)  Chloride 98 - 111 mmol/L 109 108 105  CO2 22 - 32 mmol/L 25 20(L) 24  Calcium 8.9 - 10.3 mg/dL 9.4 9.1 8.9   07/17/18 Brain MRI:  IMPRESSION: Developing acute infarction within the right side of the pons. No hemorrhage or mass effect.  Small vessel ischemic changes elsewhere as described above.  07/17/18 CTA Head and neck: IMPRESSION: 1. Negative for large vessel occlusion 2. Mild atherosclerotic disease carotid bifurcation bilaterally without significant stenosis. Fetal origin of the posterior cerebral arteries bilaterally with small vertebral arteries and small basilar artery. Mild stenosis distal left vertebral artery and mild to moderate disease throughout the basilar. 3. Mild intracranial atherosclerotic disease involving the left middle cerebral artery and left posterior cerebral artery.  07/17/18 CT head: IMPRESSION: 1. Stable and negative noncontrast CT appearance of the brain. 2. ASPECTS is 10. 3. These results were communicated to DR. A.  Shikhman at 12:32 pm on 07/17/2018 by text page via the [facility] messaging system.   07/17/18 Echocardiogram: IMPRESSIONS   1. The left ventricle has normal systolic function with an ejection fraction of 60-65%. The cavity size was normal. There is mildly increased left ventricular wall thickness. Left ventricular diastolic Doppler parameters are consistent with  pseudonormalization.  2. The right ventricle has normal systolic function. The cavity was normal. There is no increase in right ventricular wall thickness.  3. The aortic valve is tricuspid Mild thickening of the aortic valve Mild calcification of the aortic valve. no stenosis of the aortic valve.  4. The interatrial septum was not assessed.  Discharge Instructions: Discharge Instructions    Ambulatory referral to Neurology   Complete by:  As directed    Follow up with stroke clinic NP (Jessica Vanschaick or Cecille Rubin, if both not available, consider Dr. Antony Contras, Dr. Bess Harvest, or Dr. Sarina Ill) at [facility] in about 4 weeks.   Call MD for:  difficulty breathing,  headache or visual disturbances   Complete by:  As directed    Call MD for:  extreme fatigue   Complete by:  As directed    Call MD for:  hives   Complete by:  As directed    Call MD for:  persistant dizziness or light-headedness   Complete by:  As directed    Call MD for:  persistant nausea and vomiting   Complete by:  As directed    Call MD for:  redness, tenderness, or signs of infection (pain, swelling, redness, odor or green/yellow discharge around incision site)   Complete by:  As directed    Call MD for:  severe uncontrolled pain   Complete by:  As directed    Call MD for:  temperature >100.4   Complete by:  As directed    Diet - low sodium heart healthy   Complete by:  As directed    Discharge instructions   Complete by:  As directed    [name],   It has been a pleasure working with you and we are glad you're feeling better. You were hospitalized for difficult speech and balance issues. It looks like you have had a stroke, we think that this was related to you missing your medications. Our therapists have recommended inpatient therapy and we think that that would have been the safest option but since you would like to return home we will help arrange home services. Please continue to take your medications.    For your stroke,  Start taking aspirin 81 mg daily for the next 3 weeks Continue taking plavix daily Stop taking simvastatin and start taking atorvastatin 40 mg daily Follow up with the neurologist, they will contact you to arrange an appointment   Follow up with your primary care provider in 1-2 weeks  If your symptoms worsen or you develop new symptoms, please seek medical help whether it is your primary care provider or emergency department.  If you have any questions about this hospitalization please call [phone].   Increase activity slowly   Complete by:  As directed       Signed: Asencion Noble, MD 07/21/2018, 4:14 PM   Pager:  [phone]    Internal Medicine Attending Note:  I saw and examined the patient on the day of discharge. I reviewed and agree with the discharge summary written by the house staff.  To reiterate Dr. Dorothyann Peng note above, at the  time of discharge he continued to have significant ataxia and imbalance and is at high risk of falls.  He refused to consider inpatient rehab admission or SNF placement and insisted on going home, despite our safety concerns.  Lenice Pressman, M.D., Ph.D.

## 2018-07-18 NOTE — Progress Notes (Signed)
   Subjective: Mr. Tomko was doing okay today, no acute events overnight.  He reports that he still having difficulties with the speech however he feels like it has slightly improved.  He reports that he has been having some difficulty swallowing, however reports that he has been able to eat and drink, denies any coughing swallowing.  He denies any new symptoms, slept well overnight.  We discussed the results of that test thus far, that showed any secondary causes for his stroke.  We discussed that he will be getting therapy to evaluate his symptoms.  All questions were answered.   Objective:  Vital signs in last 24 hours: Vitals:   07/17/18 2321 07/17/18 2347 07/18/18 0121 07/18/18 0325  BP: 129/60 (!) 128/53 133/74 132/68  Pulse: 66  60   Resp: 16 15 20    Temp: 98.5 F (36.9 C) 98.2 F (36.8 C) 98.4 F (36.9 C) 97.8 F (36.6 C)  TempSrc: Oral Oral Oral Oral  SpO2: 97%  97%   Weight:      Height:        General: Middle-aged male, no acute distress, sitting comfortably in bed Cardiac: 3/6 heard best in the second intercostal space, RRR Pulmonary: CTA BL, no wheezing or rhonchi Abdomen: Soft, nontender, normoactive bowel sounds Neuro: Dysarthria, mild left eye nystagmus with rightward gaze, left-sided facial droop, otherwise CN II to XII intact, strength: 5/5 in RUE, LLE, RLE, 4+/5 in LUE, sensation intact to light touch    Assessment/Plan:  Active Problems:   CVA (cerebral vascular accident) [facility])  This is a 68 year old male with a history of prior CVAs, hypertension, hyperlipidemia, and hypothyroidism who presented with new onset left-sided facial droop, difficulty speaking, and balance issues.   Right pons CVA: -Presented with new onset left-sided facial droop, difficulty speaking, and balance issues.  Found to have similar symptoms on exam.  He stopped taking his medications for about 2 weeks which may have been the cause of this new stroke. CT head showed no acute  findings, CTA showed diffuse atherosclerotic disease.  MRI showed acute infarction and the right side of the pons, no hemorrhage or mass-effect, and small vessel ischemic changes. -Neurology following, appreciate recommendations -Continue atorvastatin 80 mg daily -Continue ASA 81 mg daily -Lipid panel: Cholesterol 187, triglycerides 130, HDL 38, LDL 123, cholesterol/HDL ratio 4.9 -A1c 6.5 -Continue telemetry monitoring -PT/OT -CBC and BMP in a.m.  Hypokalemia: -Potassium today is 3.2, repleting with oral and IV potassium -BMP in the afternoon -Replete as needed  AKI: -Cr elevated to 1.3 on admission, baseline of around 1.0 however this was 2 years ago. Possibly pre-renal in nature. -Today Cr was 1.2, slightly improved, continue to monitor  Hypertension -Patient is on amlodipine 10 mg and benazepril 20 mg at home, has not been taking his medications for about 2 weeks. Blood pressures have been stable, will continue to home for now.   HLD -Holding home simvastatin, started high intensity atorvastatin. -Lipid panel: Cholesterol 187, triglycerides 130, HDL 38, LDL 123, cholesterol/HDL ratio 4.9  Hypothyroidism -Continue home synthroid 200 mcg daily.   FEN: No fluids, replete lytes prn, heart healthy diet VTE ppx: Lovenox  Code Status: FULL   Dispo: Anticipated discharge in approximately 1-2 day(s).   Asencion Noble, MD 07/18/2018, 6:34 AM Pager: [phone]

## 2018-07-18 NOTE — Evaluation (Signed)
Physical Therapy Evaluation Patient Details Name: Randy Shepherd MRN: [id number] DOB: 06-Jun-1950 Today's Date: 07/18/2018   History of Present Illness  68 year old man with a history of CVA, hypothyroidism, HTN, and HLD who presented with slurred speech, left facial droop, and difficulty walking.  His symptoms started while he was at work at approximately 8 in the morning the day of admission.  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  Other people noticed the facial droop.  About an hour later, he noticed he was wobbly when he walked.  MRI shows developing infarction of the Right Pons.  Clinical Impression  Pt admitted with/for s/s of stroke described above.  Pt tested strong, but with some right incoordination which became more obvious with his unsteady/unbalanced gait.Marland Kitchen  Pt currently limited functionally due to the problems listed. ( See problems list.)   Pt will benefit from PT to maximize function and safety in order to get ready for next venue listed below.     Follow Up Recommendations CIR;Supervision/Assistance - 24 hour    Equipment Recommendations  Other (comment);None recommended by PT(TBA next venue)    Recommendations for Other Services Rehab consult     Precautions / Restrictions Precautions Precautions: Fall      Mobility  Bed Mobility Overal bed mobility: Needs Assistance Bed Mobility: Supine to Sit     Supine to sit: Supervision     General bed mobility comments: slower and mildly uncoordinated transition  Transfers Overall transfer level: Needs assistance Equipment used: None Transfers: Sit to/from Stand Sit to Stand: Min guard         General transfer comment: cues for improved technique to stand.  If pt lost focus on task, the ascent or like descent was less coordinated or controlled, with the result of mildly off balance or hard landing.  Ambulation/Gait Ambulation/Gait assistance: Min assist Gait Distance  (Feet): 140 Feet Assistive device: IV Pole Gait Pattern/deviations: Step-through pattern Gait velocity: moderate, more than pt could control well Gait velocity interpretation: <1.8 ft/sec, indicate of risk for recurrent falls General Gait Details: uncoordinated unsteady steps with mild staggering and consistent wandering left into the walls if not assisted.  Stairs            Wheelchair Mobility    Modified Rankin (Stroke Patients Only) Modified Rankin (Stroke Patients Only) Pre-Morbid Rankin Score: No symptoms Modified Rankin: Moderately severe disability     Balance Overall balance assessment: Needs assistance Sitting-balance support: No upper extremity supported Sitting balance-Leahy Scale: Fair Sitting balance - Comments: sitting down or other challenge cause pt to "rare" back out of control until he could sit upright. Postural control: Posterior lean Standing balance support: No upper extremity supported Standing balance-Leahy Scale: Fair Standing balance comment: unsteady with stepping and other strategies to stay in balance.                 Standardized Balance Assessment Standardized Balance Assessment : Berg Balance Test Berg Balance Test Sit to Stand: Able to stand  independently using hands Standing Unsupported: Able to stand 2 minutes with supervision Sitting with Back Unsupported but Feet Supported on Floor or Stool: Able to sit 2 minutes under supervision Stand to Sit: Controls descent by using hands Transfers: Able to transfer safely, definite need of hands Standing Unsupported with Eyes Closed: Able to stand 10 seconds with supervision Standing Ubsupported with Feet Together: Needs help to attain position but able to stand for 30 seconds with feet together  From Standing, Reach Forward with Outstretched Arm: Can reach forward >12 cm safely (5") From Standing Position, Pick up Object from Floor: Able to pick up shoe, needs supervision From Standing  Position, Turn to Look Behind Over each Shoulder: Looks behind one side only/other side shows less weight shift Turn 360 Degrees: Able to turn 360 degrees safely in 4 seconds or less Standing Unsupported, Alternately Place Feet on Step/Stool: Needs assistance to keep from falling or unable to try Standing Unsupported, One Foot in Front: Able to take small step independently and hold 30 seconds Standing on One Leg: Able to lift leg independently and hold > 10 seconds Total Score: 38         Pertinent Vitals/Pain Pain Assessment: No/denies pain    Home Living Family/patient expects to be discharged to:: Private residence Living Arrangements: Spouse/significant other;Children Available Help at Discharge: Family Type of Home: House Home Access: Stairs to enter     Home Layout: One level Home Equipment: None      Prior Function Level of Independence: Independent               Hand Dominance   Dominant Hand: Right    Extremity/Trunk Assessment   Upper Extremity Assessment Upper Extremity Assessment: Defer to OT evaluation    Lower Extremity Assessment Lower Extremity Assessment: Overall WFL for tasks assessed;LLE deficits/detail LLE Deficits / Details: Strong, but midly uncoordinated during MMT. LLE Coordination: decreased fine motor       Communication   Communication: Expressive difficulties  Cognition Arousal/Alertness: Awake/alert Behavior During Therapy: WFL for tasks assessed/performed(a little apathetic) Overall Cognitive Status: Within Functional Limits for tasks assessed(per SLP, NT formally)                                        General Comments      Exercises     Assessment/Plan    PT Assessment Patient needs continued PT services  PT Problem List Decreased strength;Decreased activity tolerance;Decreased balance;Decreased mobility;Decreased coordination       PT Treatment Interventions Gait training;DME  instruction;Functional mobility training;Therapeutic activities;Balance training;Patient/family education    PT Goals (Current goals can be found in the Care Plan section)  Acute Rehab PT Goals Patient Stated Goal: get home as soon as possible.  Get back to work. PT Goal Formulation: With patient Time For Goal Achievement: 08/01/18 Potential to Achieve Goals: Good    Frequency Min 4X/week   Barriers to discharge        Co-evaluation               AM-PAC PT "6 Clicks" Mobility  Outcome Measure Help needed turning from your back to your side while in a flat bed without using bedrails?: None Help needed moving from lying on your back to sitting on the side of a flat bed without using bedrails?: None Help needed moving to and from a bed to a chair (including a wheelchair)?: A Little Help needed standing up from a chair using your arms (e.g., wheelchair or bedside chair)?: A Little Help needed to walk in hospital room?: A Little Help needed climbing 3-5 steps with a railing? : A Lot 6 Click Score: 19    End of Session   Activity Tolerance: Patient tolerated treatment well Patient left: in bed;with call bell/phone within reach;with bed alarm set;with family/visitor present Nurse Communication: Mobility status PT Visit Diagnosis: Unsteadiness on feet (  R26.81);Other symptoms and signs involving the nervous system (R29.898)    Time: 4136-4383 PT Time Calculation (min) (ACUTE ONLY): 26 min   Charges:   PT Evaluation $PT Eval Moderate Complexity: 1 Mod PT Treatments $Gait Training: 8-22 mins        07/18/2018  Donnella Sham, PT [place] [phone]  (pager) [phone]  (office)  Tessie Fass Hal Norrington 07/18/2018, 3:34 PM

## 2018-07-18 NOTE — Progress Notes (Signed)
STROKE TEAM PROGRESS NOTE   INTERVAL HISTORY  he presented with slurred speech and facial droop starting yesterday morning. I personally reviewed history of present illness with the patient.  Vitals:   07/18/18 0121 07/18/18 0325 07/18/18 0851 07/18/18 1126  BP: 133/74 132/68 (!) 141/83 (!) 146/85  Pulse: 60  71 68  Resp: 20  16 15   Temp: 98.4 F (36.9 C) 97.8 F (36.6 C) 98 F (36.7 C) 98.1 F (36.7 C)  TempSrc: Oral Oral Oral Oral  SpO2: 97%  97% 97%  Weight:      Height:        CBC:  Recent Labs  Lab 07/17/18 1220 07/18/18 0449  WBC 12.2* 11.5*  NEUTROABS 7.2  --   HGB 14.4 14.0  HCT 43.8 42.4  MCV 89.2 88.7  PLT 293 536    Basic Metabolic Panel:  Recent Labs  Lab 07/17/18 1220 07/17/18 1237 07/18/18 0449  NA 139  --  139  K 3.7  --  3.2*  CL 106  --  105  CO2 24  --  24  GLUCOSE 95  --  115*  BUN 18  --  13  CREATININE 1.38* 1.30* 1.21  CALCIUM 9.2  --  8.9   Lipid Panel:     Component Value Date/Time   CHOL 187 07/18/2018 0449   TRIG 130 07/18/2018 0449   HDL 38 (L) 07/18/2018 0449   CHOLHDL 4.9 07/18/2018 0449   VLDL 26 07/18/2018 0449   LDLCALC 123 (H) 07/18/2018 0449   HgbA1c:  Lab Results  Component Value Date   HGBA1C 6.2 (H) 07/18/2018   Urine Drug Screen:     Component Value Date/Time   LABOPIA NONE DETECTED 04/11/2017 1051   COCAINSCRNUR NONE DETECTED 04/11/2017 1051   LABBENZ NONE DETECTED 04/11/2017 1051   AMPHETMU NONE DETECTED 04/11/2017 1051   THCU NONE DETECTED 04/11/2017 1051   LABBARB NONE DETECTED 04/11/2017 1051    Alcohol Level     Component Value Date/Time   ETH <10 04/11/2017 0732    IMAGING Ct Angio Head W Or Wo Contrast  Result Date: 07/17/2018 CLINICAL DATA:  Focal neuro deficit less than 6 hours.  Stroke. EXAM: CT ANGIOGRAPHY HEAD AND NECK TECHNIQUE: Multidetector CT imaging of the head and neck was performed using the standard protocol during bolus administration of intravenous contrast. Multiplanar CT  image reconstructions and MIPs were obtained to evaluate the vascular anatomy. Carotid stenosis measurements (when applicable) are obtained utilizing NASCET criteria, using the distal internal carotid diameter as the denominator. CONTRAST:  64mL OMNIPAQUE IOHEXOL 350 MG/ML SOLN COMPARISON:  None. FINDINGS: CTA NECK FINDINGS Aortic arch: Standard branching. Imaged portion shows no evidence of aneurysm or dissection. No significant stenosis of the major arch vessel origins. Right carotid system: Mild atherosclerotic disease proximal right internal carotid artery without significant stenosis. Remainder of the right carotid system widely patent Left carotid system: Atherosclerotic disease left carotid bifurcation. Left internal carotid artery patent without significant stenosis. Mild to moderate stenosis proximal external carotid artery on the left. Vertebral arteries: Hypoplastic right vertebral artery which ends in PICA. Small left vertebral artery which supplies the basilar. Hypoplastic vertebral artery due to fetal origin of the posterior cerebral artery bilaterally. Skeleton: Cervical spondylosis.  No acute skeletal abnormality. Other neck: Negative Upper chest: Negative Review of the MIP images confirms the above findings CTA HEAD FINDINGS Anterior circulation: Atherosclerotic calcification in the cavernous carotid bilaterally with mild stenosis bilaterally. Anterior and middle cerebral arteries patent  bilaterally. Hypoplastic left A1 segment. Mild atherosclerotic disease in the left M1 segment. No significant stenosis in the anterior circulation. Posterior circulation: Right vertebral artery ends in PICA. Left vertebral artery supplies the basilar. Mild stenosis distal left vertebral artery. Basilar artery diffusely diseased with mild-to-moderate stenosis diffusely. Superior cerebellar arteries patent bilaterally. Posterior cerebral artery origin from the internal carotid artery bilaterally. Mild stenosis left  posterior cerebral artery P2 segment Venous sinuses: Not well opacified due to arterial phase scanning. Anatomic variants: None Delayed phase: Not performed Review of the MIP images confirms the above findings IMPRESSION: 1. Negative for large vessel occlusion 2. Mild atherosclerotic disease carotid bifurcation bilaterally without significant stenosis. Fetal origin of the posterior cerebral arteries bilaterally with small vertebral arteries and small basilar artery. Mild stenosis distal left vertebral artery and mild to moderate disease throughout the basilar. 3. Mild intracranial atherosclerotic disease involving the left middle cerebral artery and left posterior cerebral artery. Electronically Signed   By: Franchot Gallo M.D.   On: 07/17/2018 13:01   Ct Angio Neck W And/or Wo Contrast  Result Date: 07/17/2018 CLINICAL DATA:  Focal neuro deficit less than 6 hours.  Stroke. EXAM: CT ANGIOGRAPHY HEAD AND NECK TECHNIQUE: Multidetector CT imaging of the head and neck was performed using the standard protocol during bolus administration of intravenous contrast. Multiplanar CT image reconstructions and MIPs were obtained to evaluate the vascular anatomy. Carotid stenosis measurements (when applicable) are obtained utilizing NASCET criteria, using the distal internal carotid diameter as the denominator. CONTRAST:  68mL OMNIPAQUE IOHEXOL 350 MG/ML SOLN COMPARISON:  None. FINDINGS: CTA NECK FINDINGS Aortic arch: Standard branching. Imaged portion shows no evidence of aneurysm or dissection. No significant stenosis of the major arch vessel origins. Right carotid system: Mild atherosclerotic disease proximal right internal carotid artery without significant stenosis. Remainder of the right carotid system widely patent Left carotid system: Atherosclerotic disease left carotid bifurcation. Left internal carotid artery patent without significant stenosis. Mild to moderate stenosis proximal external carotid artery on the  left. Vertebral arteries: Hypoplastic right vertebral artery which ends in PICA. Small left vertebral artery which supplies the basilar. Hypoplastic vertebral artery due to fetal origin of the posterior cerebral artery bilaterally. Skeleton: Cervical spondylosis.  No acute skeletal abnormality. Other neck: Negative Upper chest: Negative Review of the MIP images confirms the above findings CTA HEAD FINDINGS Anterior circulation: Atherosclerotic calcification in the cavernous carotid bilaterally with mild stenosis bilaterally. Anterior and middle cerebral arteries patent bilaterally. Hypoplastic left A1 segment. Mild atherosclerotic disease in the left M1 segment. No significant stenosis in the anterior circulation. Posterior circulation: Right vertebral artery ends in PICA. Left vertebral artery supplies the basilar. Mild stenosis distal left vertebral artery. Basilar artery diffusely diseased with mild-to-moderate stenosis diffusely. Superior cerebellar arteries patent bilaterally. Posterior cerebral artery origin from the internal carotid artery bilaterally. Mild stenosis left posterior cerebral artery P2 segment Venous sinuses: Not well opacified due to arterial phase scanning. Anatomic variants: None Delayed phase: Not performed Review of the MIP images confirms the above findings IMPRESSION: 1. Negative for large vessel occlusion 2. Mild atherosclerotic disease carotid bifurcation bilaterally without significant stenosis. Fetal origin of the posterior cerebral arteries bilaterally with small vertebral arteries and small basilar artery. Mild stenosis distal left vertebral artery and mild to moderate disease throughout the basilar. 3. Mild intracranial atherosclerotic disease involving the left middle cerebral artery and left posterior cerebral artery. Electronically Signed   By: Franchot Gallo M.D.   On: 07/17/2018 13:01   Mr Brain  Wo Contrast  Result Date: 07/17/2018 CLINICAL DATA:  Slurred speech and facial  droop beginning this morning. EXAM: MRI HEAD WITHOUT CONTRAST TECHNIQUE: Multiplanar, multiecho pulse sequences of the brain and surrounding structures were obtained without intravenous contrast. COMPARISON:  Head CT earlier same day.  MRI 04/11/2017 FINDINGS: Brain: Diffusion imaging shows developing acute infarction within the right side of the pons. No other acute insult. Old small vessel infarctions present within the pons, cerebellum, left basal ganglia and cerebral hemispheric white matter. No large vessel territory infarction. No mass lesion, hemorrhage, hydrocephalus or extra-axial collection. Vascular: Major vessels at the base of the brain show flow. Skull and upper cervical spine: Negative Sinuses/Orbits: Clear/normal Other: None IMPRESSION: Developing acute infarction within the right side of the pons. No hemorrhage or mass effect. Small vessel ischemic changes elsewhere as described above. Electronically Signed   By: Nelson Chimes M.D.   On: 07/17/2018 17:13   Ct Head Code Stroke Wo Contrast  Result Date: 07/17/2018 CLINICAL DATA:  Code stroke.  68 year old male EXAM: CT HEAD WITHOUT CONTRAST TECHNIQUE: Contiguous axial images were obtained from the base of the skull through the vertex without intravenous contrast. COMPARISON:  Head CT 04/13/2017 and earlier. FINDINGS: Brain: Cerebral volume remains normal. Small perivascular space at the left lentiform is unchanged. Gray-white matter differentiation elsewhere appears stable and within normal limits. No midline shift, ventriculomegaly, mass effect, evidence of mass lesion, intracranial hemorrhage or evidence of cortically based acute infarction. Vascular: Calcified atherosclerosis at the skull base. No suspicious intracranial vascular hyperdensity. Skull: Negative. Sinuses/Orbits: Visualized paranasal sinuses and mastoids are stable and well pneumatized. Other: Negative orbit and scalp soft tissues. ASPECTS ([place] Stroke Program Early CT Score) -  Ganglionic level infarction (caudate, lentiform nuclei, internal capsule, insula, M1-M3 cortex): 7 - Supraganglionic infarction (M4-M6 cortex): 3 Total score (0-10 with 10 being normal): 10 IMPRESSION: 1. Stable and negative noncontrast CT appearance of the brain. 2. ASPECTS is 10. 3. These results were communicated to DR. ACristobal Goldmann at 12:32 pm on 07/17/2018 by text page via the [facility] messaging system. Electronically Signed   By: Genevie Ann M.D.   On: 07/17/2018 12:33   2D Echocardiogram   1. The left ventricle has normal systolic function with an ejection fraction of 60-65%. The cavity size was normal. There is mildly increased left ventricular wall thickness. Left ventricular diastolic Doppler parameters are consistent with  pseudonormalization.  2. The right ventricle has normal systolic function. The cavity was normal. There is no increase in right ventricular wall thickness.  3. The aortic valve is tricuspid Mild thickening of the aortic valve Mild calcification of the aortic valve. no stenosis of the aortic valve.  4. The interatrial septum was not assessed.   PHYSICAL EXAM Pleasant middle-age Caucasian male not in distress. . Afebrile. Head is nontraumatic. Neck is supple without bruit.    Cardiac exam no murmur or gallop. Lungs are clear to auscultation. Distal pulses are well felt. Neurological Exam ;  Awake  Alert oriented x 3. Mildly dysarthricl speech and no aphasia.eye movements full without nystagmus.fundi were not visualized. Vision acuity and fields appear normal. Hearing is normal. Palatal movements are normal. Face asymmetric with left lower facial weakness.. Tongue midline. Normal strength, tone, reflexes and coordination. Normal sensation. Gait deferred.   ASSESSMENT/PLAN Randy Shepherd is a 68 y.o. male with history of previous stroke with full recovery presenting with slurred speech and facial droop.   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 ASPECTS 10.     CTA head & neck no LVO. Mild atherosclerosis B ICA, L VA (stenosis), BA (stenosis), L MCA, L PCA.  MRI  R pontine infarct. Small vessel disease.   2D Echo w/ bubble  EF 60-65%. No source of embolus   LDL 123  HgbA1c 6.2  SCDs for VTE prophylaxis  No antithrombotic prior to admission (had been on plavix in the past but ran out, does say he has taken for the past 4 days but Dr. Leonie Man is unsure that is correct), now on aspirin 81 mg daily and clopidogrel 75 mg daily. Continue DAPT x 3 weeks then plavix alone  Therapy recommendations:  pending   Disposition:  pending  Follow-up Stroke Clinic at [facility] in 4 weeks. Office will call with appointment date and time. Order placed.  Hypertension  Stable . Permissive hypertension (OK if < 220/120) but gradually normalize in 5-7 days . 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 enrolled in Stroke AF study. Treated with DAPT x 3 mo then plavix alone  Other Active Problems  Hypokalemia  AKI  Hypothyroidism  Hospital day # 1  I have personally obtained history,examined this patient, reviewed notes, independently viewed imaging studies, participated in medical decision making and plan of  care.ROS completed by me personally and pertinent positives fully documented  I have made any additions or clarifications directly to the above note. Agree with note above. He presented with facial weakness and slurred speech due to right pontine infarct from small vessel disease. Recommend dual antiplatelet therapy of aspirin and Plavix for 3 weeks followed by Plavix alone. Aggressive risk factor modification. Long discussion with patient and answered questions. Greater than 50% time during this 35 minute visit was spent on counseling and coordination of care about his [facility] stroke and answered questions.  Antony Contras, MD Medical Director [facility] Pager: [phone] 07/18/2018 3:00 PM   To contact Stroke Continuity provider, please refer to [email or web address] After hours, contact General Neurology

## 2018-07-18 NOTE — Progress Notes (Signed)
  Date: 07/18/2018  Patient name: Randy Shepherd  Medical record number: [id number]  Date of birth: 06/16/1950   I have seen and evaluated this patient and I have discussed the plan of care with the house staff. Please see their note for complete details. I concur with their findings with the following additions/corrections:   Please see my separate attestation of the H&P from 07/17/2018.  Lenice Pressman, M.D., Ph.D. 07/18/2018, 11:30 AM

## 2018-07-18 NOTE — Progress Notes (Signed)
Inpatient Rehabilitation-Admissions Coordinator   Met with pt and his wife at the bedside to discuss recommendation for CIR. Family wants to return home and hopeful for [facility] but would like more information regarding that choice. They were not interested in CIR program. [facility] will communicate requests to CM/SW team and sign off.   Please call if questions.   Jhonnie Garner, OTR/L  Rehab Admissions Coordinator  [phone] 07/18/2018 4:54 PM

## 2018-07-18 NOTE — Progress Notes (Signed)
Rehab Admissions Coordinator Note:  Per PT and SLP recommendation, this patient was screened by Jhonnie Garner for appropriateness for an Inpatient Acute Rehab Consult.  At this time, we are recommending an Inpatient Rehab consult. AC will contact MD to request an IP Rehab Consult Order.   Jhonnie Garner 07/18/2018, 3:47 PM  I can be reached at [phone].

## 2018-07-19 DIAGNOSIS — R260 Ataxic gait: Secondary | ICD-10-CM

## 2018-07-19 DIAGNOSIS — Z9181 History of falling: Secondary | ICD-10-CM

## 2018-07-19 DIAGNOSIS — R2681 Unsteadiness on feet: Secondary | ICD-10-CM

## 2018-07-19 DIAGNOSIS — Z7982 Long term (current) use of aspirin: Secondary | ICD-10-CM

## 2018-07-19 DIAGNOSIS — R270 Ataxia, unspecified: Secondary | ICD-10-CM

## 2018-07-19 DIAGNOSIS — R471 Dysarthria and anarthria: Secondary | ICD-10-CM

## 2018-07-19 LAB — BASIC METABOLIC PANEL
Anion gap: 7 (ref 5–15)
BUN: 13 mg/dL (ref 8–23)
CO2: 25 mmol/L (ref 22–32)
Calcium: 9.4 mg/dL (ref 8.9–10.3)
Chloride: 109 mmol/L (ref 98–111)
Creatinine, Ser: 1.22 mg/dL (ref 0.61–1.24)
GFR calc Af Amer: >60 mL/min (ref >60)
GFR calc non Af Amer: >60 mL/min (ref >60)
Glucose, Bld: 99 mg/dL (ref 70–99)
Potassium: 3.9 mmol/L (ref 3.5–5.1)
Sodium: 141 mmol/L (ref 135–145)

## 2018-07-19 MED ORDER — ATORVASTATIN CALCIUM 40 MG PO TABS: 40.0000 mg | ORAL_TABLET | Freq: Every day | ORAL | 0 refills | Status: DC

## 2018-07-19 MED ORDER — ASPIRIN EC 81 MG PO TBEC: 81.0000 mg | DELAYED_RELEASE_TABLET | Freq: Every day | ORAL | 0 refills | Status: DC

## 2018-07-19 NOTE — Progress Notes (Signed)
STROKE TEAM PROGRESS NOTE   INTERVAL HISTORY He states his doing well. His speech and facial droop is improved. Therapists have recommended rehabilitation but patient prefers to go home.  Vitals:   07/18/18 2357 07/19/18 0438 07/19/18 0740 07/19/18 1143  BP: 135/75 (!) 145/78 140/85 136/78  Pulse: 69 70 100 68  Resp: 18 18 18 16   Temp: 98.1 F (36.7 C) 97.6 F (36.4 C) 97.8 F (36.6 C) 98 F (36.7 C)  TempSrc: Oral Oral Oral Oral  SpO2: 92% 91% 95% 98%  Weight:      Height:        CBC:  Recent Labs  Lab 07/17/18 1220 07/18/18 0449  WBC 12.2* 11.5*  NEUTROABS 7.2  --   HGB 14.4 14.0  HCT 43.8 42.4  MCV 89.2 88.7  PLT 293 709    Basic Metabolic Panel:  Recent Labs  Lab 07/18/18 1242 07/19/18 1001  NA 140 141  K 3.9 3.9  CL 108 109  CO2 20* 25  GLUCOSE 110* 99  BUN 12 13  CREATININE 1.20 1.22  CALCIUM 9.1 9.4   Lipid Panel:     Component Value Date/Time   CHOL 187 07/18/2018 0449   TRIG 130 07/18/2018 0449   HDL 38 (L) 07/18/2018 0449   CHOLHDL 4.9 07/18/2018 0449   VLDL 26 07/18/2018 0449   LDLCALC 123 (H) 07/18/2018 0449   HgbA1c:  Lab Results  Component Value Date   HGBA1C 6.2 (H) 07/18/2018   Urine Drug Screen:     Component Value Date/Time   LABOPIA NONE DETECTED 04/11/2017 1051   COCAINSCRNUR NONE DETECTED 04/11/2017 1051   LABBENZ NONE DETECTED 04/11/2017 1051   AMPHETMU NONE DETECTED 04/11/2017 1051   THCU NONE DETECTED 04/11/2017 1051   LABBARB NONE DETECTED 04/11/2017 1051    Alcohol Level     Component Value Date/Time   [facility] <10 04/11/2017 0732    IMAGING Mr Brain Wo Contrast  Result Date: 07/17/2018 CLINICAL DATA:  Slurred speech and facial droop beginning this morning. EXAM: MRI HEAD WITHOUT CONTRAST TECHNIQUE: Multiplanar, multiecho pulse sequences of the brain and surrounding structures were obtained without intravenous contrast. COMPARISON:  Head CT earlier same day.  MRI 04/11/2017 FINDINGS: Brain: Diffusion imaging shows  developing acute infarction within the right side of the pons. No other acute insult. Old small vessel infarctions present within the pons, cerebellum, left basal ganglia and cerebral hemispheric white matter. No large vessel territory infarction. No mass lesion, hemorrhage, hydrocephalus or extra-axial collection. Vascular: Major vessels at the base of the brain show flow. Skull and upper cervical spine: Negative Sinuses/Orbits: Clear/normal Other: None IMPRESSION: Developing acute infarction within the right side of the pons. No hemorrhage or mass effect. Small vessel ischemic changes elsewhere as described above. Electronically Signed   By: Nelson Chimes M.D.   On: 07/17/2018 17:13   2D Echocardiogram   1. The left ventricle has normal systolic function with an ejection fraction of 60-65%. The cavity size was normal. There is mildly increased left ventricular wall thickness. Left ventricular diastolic Doppler parameters are consistent with  pseudonormalization.  2. The right ventricle has normal systolic function. The cavity was normal. There is no increase in right ventricular wall thickness.  3. The aortic valve is tricuspid Mild thickening of the aortic valve Mild calcification of the aortic valve. no stenosis of the aortic valve.  4. The interatrial septum was not assessed.   PHYSICAL EXAM Pleasant middle-age Caucasian male not in distress. . Afebrile.  Head is nontraumatic. Neck is supple without bruit.    Cardiac exam no murmur or gallop. Lungs are clear to auscultation. Distal pulses are well felt. Neurological Exam ;  Awake  Alert oriented x 3. Mildly dysarthric speech and no aphasia.eye movements full without nystagmus.fundi were not visualized. Vision acuity and fields appear normal. Hearing is normal. Palatal movements are normal. Face asymmetric with left lower facial weakness.. Tongue midline. Normal strength, tone, reflexes and coordination. Normal sensation. Gait  deferred.   ASSESSMENT/PLAN Mr. MARKEY DEADY is a 68 y.o. male with history of previous stroke with full recovery presenting with slurred speech and facial droop.   Stroke:   R pontine infarct secondary to small vessel disease source.multiple vascular risk factors of obesity, hypertension, hyperlipidemia, intracranial atherosclerosis and sleep apnea  Code Stroke CT head No acute stroke. ASPECTS 10.     CTA head & neck no LVO. Mild atherosclerosis B ICA, L VA (stenosis), BA (stenosis), L MCA, L PCA.  MRI  R pontine infarct. Small vessel disease.   2D Echo w/ bubble  EF 60-65%. No source of embolus   LDL 123  HgbA1c 6.2  SCDs for VTE prophylaxis  No antithrombotic prior to admission (had been on plavix in the past but ran out, does say he has taken for the past 4 days but Dr. Leonie Man is unsure that is correct), now on aspirin 81 mg daily and clopidogrel 75 mg daily. Continue DAPT x 3 weeks then plavix alone  Therapy recommendations:  CLR Disposition:  Home Follow-up Stroke Clinic at [facility] in 4 weeks. Office will call with appointment date and time. Order placed.  Hypertension  Stable . Permissive hypertension (OK if < 220/120) but gradually normalize in 5-7 days . 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 enrolled in Stroke AF study. Treated with DAPT x 3 mo then plavix alone  Other Active  Problems  Hypokalemia  AKI  Hypothyroidism  Hospital day # 2   . He presented with facial weakness and slurred speech due to right pontine infarct from small vessel disease. Recommend dual antiplatelet therapy of aspirin and Plavix for 3 weeks followed by Plavix alone. Aggressive risk factor modification   follow-up    an outpatient in the stroke clinic in 6 weeks.Stroke team will sign off Antony Contras, MD Medical Director [facility] Pager: [phone] 07/19/2018 1:03 PM   To contact Stroke Continuity provider, please refer to [email or web address] After hours, contact General Neurology

## 2018-07-19 NOTE — Progress Notes (Signed)
Patient had an episode of 7 runs of V-Tach. Denies any chest pain. Vitals signs stable. Dr Alfonse Spruce made aware. Will continue to monitor

## 2018-07-19 NOTE — Progress Notes (Signed)
Inpatient Rehabilitation-Admissions Coordinator   [facility] discussed insurance benefits for CIR program with pt and his wife at the bedside. Pt still choosing home rather than CIR at this time. Wife in agreement for return home. AC has communicated decision to CM and SW. AC will sign off.   Please call if questions.   Jhonnie Garner, OTR/L  Rehab Admissions Coordinator  [phone] 07/19/2018 12:58 PM

## 2018-07-19 NOTE — TOC Transition Note (Addendum)
Transition of Care [facility]) - CM/SW Discharge Note   Patient Details  Name: Randy Shepherd MRN: [id number] Date of Birth: 03-Sep-1950  Transition of Care [facility]) CM/SW Contact:  Pollie Friar, RN Phone Number: 07/19/2018, 2:33 PM   Clinical Narrative:    Pt and wife do not feel they can afford the co pays for CIR. They are choosing to dc home. Wife to provide supervision at home and transport home.   Final next level of care: [place] Barriers to Discharge: No Barriers Identified   Patient Goals and CMS Choice Patient states their goals for this hospitalization and ongoing recovery are:: to get back to work CMS Medicare.gov Compare Post Acute Care list provided to:: Patient Choice offered to / list presented to : Spouse, Patient  Discharge Placement                       Discharge Plan and Services   Discharge Planning Services: CM Consult Post Acute Care Choice: Home Health              HH Arranged: RN, PT, OT, Nurse's Aide, Social Work, Theme park manager Therapy HH Agency: [place] with Alvis Lemmings accepted the referral   Social Determinants of Health (SDOH) Interventions     Readmission Risk Interventions No flowsheet data found.

## 2018-07-19 NOTE — Discharge Instructions (Signed)
Randy Shepherd,   It has been a pleasure working with you and we are glad you're feeling better. You were hospitalized for difficult speech and balance issues. It looks like you have had a stroke, we think that this was related to you missing your medications. Our therapists have recommended inpatient therapy and we think that that would have been the safest option but since you would like to return home we will help arrange home services. Please continue to take your medications.    For your stroke,  Start taking aspirin 81 mg daily for the next 3 weeks Continue taking plavix daily Stop taking simvastatin and start taking atorvastatin 40 mg daily Follow up with the neurologist, they will contact you to arrange an appointment   Follow up with your primary care provider in 1-2 weeks  If your symptoms worsen or you develop new symptoms, please seek medical help whether it is your primary care provider or emergency department.  If you have any questions about this hospitalization please call [phone].

## 2018-07-19 NOTE — Progress Notes (Signed)
Subjective: Randy Shepherd reported that he was feeling better today, no acute events overnight. He reports that he was walking to the bathroom with help. He denied any new issues today, states that his speech is about the same. We discussed that OT, PT, and speech recommend CIR however patient reported that he did not want to go and that he wanted to go home. We discussed that it would be unsafe given his balance issues. And that we strongly recommend CIR in order to improve his deficits. He reported understanding but that he still wanted to go home, he reported that his wife and son would be able to help him get around.   Objective:  Vital signs in last 24 hours: Vitals:   07/18/18 1556 07/18/18 2017 07/18/18 2357 07/19/18 0438  BP: (!) 145/91 121/71 135/75 (!) 145/78  Pulse: 73 73 69 70  Resp: 17 18 18 18   Temp: 97.9 F (36.6 C) 97.8 F (36.6 C) 98.1 F (36.7 C) 97.6 F (36.4 C)  TempSrc: Oral Oral Oral Oral  SpO2: 99% 97% 92% 91%  Weight:      Height:        General: NAD, laying in bed Cardiac: Systolic murmur, RRR Pulmonary: CTABL, no wheezing or rhonchi Abdomen: Soft, non-tender, non-distended Extremity: No LE edema Neuro: Unsteady balance, dysarthric speech, left sided facial droop 5/5 strength in upper and lower extremities, sensation to light touch intact,     Assessment/Plan:  Principal Problem:   CVA (cerebral vascular accident) ([place]) Active Problems:   Hypertension   Hyperlipidemia   Thyroid dysfunction   Hypokalemia   This is a 68 year old male with a history of prior CVAs, hypertension, hyperlipidemia, and hypothyroidism who presented with new onset left-sided facial droop, difficulty speaking, and balance issues.   Right pontine infarct: -Presented with new onset left-sided facial droop, difficulty speaking, and balance issues.  Found to have similar symptoms on exam.  He stopped taking his medications for about 2 weeks which may have been the cause of  this new stroke. CT head showed no acute findings, CTA showed diffuse atherosclerotic disease.  MRI showed acute infarction and the right side of the pons, no hemorrhage or mass-effect, and small vessel ischemic changes.  Patient continues to have symptoms with little improvement.  PT, OT, and speech evaluated patient and recommended CIR, however patient was declining this at this time. -Lipid panel: Cholesterol 187, triglycerides 130, HDL 38, LDL 123, cholesterol/HDL ratio 4.9 -A1c 6.5 -Continue telemetry monitoring -Neurology following, appreciate recommendations -Continue atorvastatin 80 mg daily -Continue ASA 81 mg daily  -Continue Plavix -PT/OT evaluation, recommendations for home health options if patient continues to wish to return home  Hypokalemia: -Potassium today was 3.9, will continue to monitor for now  AKI: -Cr elevated to 1.3 on admission, baseline of around 1.0 however this was 2 years ago. Possibly pre-renal in nature. -Improved to 1.22 today  Hypertension -Patient is on amlodipine 10 mg and benazepril 20 mg at home, has not been taking his medications for about 2 weeks. Blood pressures have been stable, will continue to home for now.   HLD -Holding home simvastatin, started high intensity atorvastatin. -Lipid panel: Cholesterol 187, triglycerides 130, HDL 38, LDL 123, cholesterol/HDL ratio 4.9  Hypothyroidism -Continue home synthroid 200 mcg daily.   FEN: No fluids, replete lytes prn, heart healthy diet VTE ppx: Lovenox  Code Status: FULL   Dispo: Anticipated discharge is possibly today  Randy Noble, MD 07/19/2018, 6:28 AM Pager: [phone]

## 2018-07-19 NOTE — Progress Notes (Signed)
AM Progress Note  Pt still not aware of all the issues with safety.  Emphasis on showing pt some of the problems with sit to stand, balance and gait stability problems     07/19/18 1033  PT Visit Information  Last PT Received On 07/19/18  Assistance Needed +1  History of Present Illness 68 year old man with a history of CVA, hypothyroidism, HTN, and HLD who presented with slurred speech, left facial droop, and difficulty walking.  His symptoms started while he was at work at approximately 8 in the morning the day of admission.  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  Other people noticed the facial droop.  About an hour later, he noticed he was wobbly when he walked.  MRI shows developing infarction of the Right Pons.  Subjective Data  Subjective I'm Okay  Patient Stated Goal get home as soon as possible.  Get back to work.  Precautions  Precautions Fall  Cognition  Arousal/Alertness Awake/alert  Behavior During Therapy [facility] for tasks assessed/performed  Overall Cognitive Status Impaired/Different from baseline  Area of Impairment Safety/judgement;Awareness  Safety/Judgement Decreased awareness of safety;Decreased awareness of deficits  Awareness Emergent  Bed Mobility  Overal bed mobility Needs Assistance  Bed Mobility Supine to Sit  Supine to sit Supervision  General bed mobility comments slower and mildly uncoordinated transition  Transfers  Overall transfer level Needs assistance  Equipment used None  Transfers Sit to/from Stand  Sit to Stand Min guard;Min assist  General transfer comment for safety, pt still biased left.  He steps and staggers, uses stationary objects to try to self recover.  Sometimes needs assist to recover.  Ambulation/Gait  Ambulation/Gait assistance Min assist  Gait Distance (Feet) 150 Feet (x4 with constant need for regrouping.)  Assistive device None  Gait Pattern/deviations Step-through pattern  General Gait  Details moderately unsteady overall. whether directly assisting or close guarding.   Pt unable to control his speed going forward, worse backing up.  Bounces off walls trying to maintain balance.  Scissors and drifts left when attempting to walk forward in a confined path.  Much worse with scanning.  Pt would need moderate direct contact to stay relatively steady.  Gait velocity interpretation 1.31 - 2.62 ft/sec, indicative of limited community ambulator  Stairs Yes  Stairs assistance Min assist;Mod assist  Stair Management One rail Left;Alternating pattern;Forwards  Number of Stairs 10  General stair comments even holding the rail, pt kept stumping toes on the next higher step with both feet L>R., falling up the steps, not thinking to slow down to stay under control.  Descent mildly better, but therapist imposed more control for safety.  Modified Rankin (Stroke Patients Only)  Pre-Morbid Rankin Score 0  Modified Rankin 4  Balance  Overall balance assessment Needs assistance  Sitting-balance support No upper extremity supported  Sitting balance-Leahy Scale Fair (to good)  Sitting balance - Comments pt not anticipatory of possible challenges to balance, but sits EOB with stability and no challenge  Standing balance-Leahy Scale Fair  Standing balance comment with Wide BOS pt can stand without assist.  Challenge or narrowed BOS causes significant instability.  PT - End of Session  Activity Tolerance Patient tolerated treatment well  Patient left in bed;with call bell/phone within reach;with bed alarm set;with family/visitor present  Nurse Communication Mobility status   PT - Assessment/Plan  PT Plan Current plan remains appropriate  PT Visit Diagnosis Unsteadiness on feet (R26.81);Other symptoms and signs involving the  nervous system (R29.898)  PT Frequency (ACUTE ONLY) Min 4X/week  Follow Up Recommendations CIR;Supervision/Assistance - 24 hour (pt/wife have decided to go home.)  PT  equipment Other (comment);None recommended by PT  AM-PAC PT "6 Clicks" Mobility Outcome Measure (Version 2)  Help needed turning from your back to your side while in a flat bed without using bedrails? 4  Help needed moving from lying on your back to sitting on the side of a flat bed without using bedrails? 4  Help needed moving to and from a bed to a chair (including a wheelchair)? 3  Help needed standing up from a chair using your arms (e.g., wheelchair or bedside chair)? 3  Help needed to walk in hospital room? 3  Help needed climbing 3-5 steps with a railing?  2  6 Click Score 19  Consider Recommendation of Discharge To: Home with [facility]  PT Goal Progression  Progress towards PT goals Progressing toward goals  Acute Rehab PT Goals  PT Goal Formulation With patient  Time For Goal Achievement 08/01/18  Potential to Achieve Goals Good  PT Time Calculation  PT Start Time (ACUTE ONLY) 1011  PT Stop Time (ACUTE ONLY) 1029  PT Time Calculation (min) (ACUTE ONLY) 18 min  PT General Charges  $$ ACUTE PT VISIT 1 Visit  PT Treatments  $Gait Training 8-22 mins    07/19/2018  Donnella Sham, PT Acute Rehabilitation Services [phone]  (pager) [phone]  (office)

## 2018-07-19 NOTE — Progress Notes (Signed)
  Date: 07/19/2018  Patient name: Randy Shepherd  Medical record number: [id number]  Date of birth: 12-13-1950   I have seen and evaluated this patient and I have discussed the plan of care with the house staff. Please see their note for complete details. I concur with their findings with the following additions/corrections:   68 year old man admitted with right pontine stroke.  He reports he feels better, but he still has significant dysarthria.  His wife reports he has been able to get up and walk to the bathroom and back.  On my exam, he still has significant discoordination of his tongue as well as with finger-to-nose and heel shin testing.  He is extremely unsteady with standing and is prone to tipping backward when walking.  We had an extensive discussion with them about the risks of him going home.  I am extremely concerned that he is at high risk of falling given his significant ataxia.  However, after multiple discussions with our team, rehab staff, and care coordination staff, he is insisting on going home.  He says his wife will provide care.  I made it clear that he requires 24-hour supervision and should not get up by himself, including going to the bathroom in the middle of the night.  I do not think this is a safe discharge plan for him, but we have informed them of the risks and they would like to proceed with discharge with home health.  Lenice Pressman, M.D., Ph.D. 07/19/2018, 2:57 PM

## 2018-07-19 NOTE — Progress Notes (Signed)
Later PM Progress Note  Pt/wife state they are unable to go to CIR.  Gait training with different assistive devices tried.  RW is the only device tried that gives pt and wife a chance to be safe at home.  Wife assisted pt with RW and needed some intervention from the therapist to provide more stability for the patient.  Demonstrated basic handling skills and critiqued her as she demonstrated in a real world situation.    07/19/18 1700  PT Visit Information  Last PT Received On 07/19/18  Assistance Needed +1  History of Present Illness 68 year old man with a history of CVA, hypothyroidism, HTN, and HLD who presented with slurred speech, left facial droop, and difficulty walking.  His symptoms started while he was at work at approximately 8 in the morning the day of admission.  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  Other people noticed the facial droop.  About an hour later, he noticed he was wobbly when he walked.  MRI shows developing infarction of the Right Pons.  Subjective Data  Patient Stated Goal get home as soon as possible.  Get back to work.  Precautions  Precautions Fall  Cognition  Arousal/Alertness Awake/alert  Behavior During Therapy [facility] for tasks assessed/performed  Overall Cognitive Status Impaired/Different from baseline  Area of Impairment Safety/judgement;Awareness  Safety/Judgement Decreased awareness of safety;Decreased awareness of deficits  Awareness Emergent  Bed Mobility  Overal bed mobility Needs Assistance  Bed Mobility Supine to Sit  Supine to sit Supervision  General bed mobility comments slower and mildly uncoordinated transition  Transfers  Overall transfer level Needs assistance  Equipment used None  Transfers Sit to/from Stand  Sit to Stand Min guard  General transfer comment close guard for safety.  Had pt's wife stabilize to see what pt felt like.  Ambulation/Gait  Ambulation/Gait assistance Min assist  Gait  Distance (Feet) 90 Feet (with the single point cane, then additional 150 feet with RW)  Assistive device Straight cane;Rolling walker (2 wheeled)  Gait Pattern/deviations Step-through pattern  General Gait Details sequencing and use of the cane was more hazardous than using nothing.  Pt was able to stay controlled enough for his wife to assist under a controlled environment.  She will need more practice at home due to trouble sustaining focus.  Gait velocity interpretation <1.8 ft/sec, indicate of risk for recurrent falls  Modified Rankin (Stroke Patients Only)  Modified Rankin 4  Balance  Sitting balance-Leahy Scale Fair  Standing balance-Leahy Scale Fair  PT - End of Session  Activity Tolerance Patient tolerated treatment well  Patient left in bed;with call bell/phone within reach;with bed alarm set;with family/visitor present  Nurse Communication Mobility status   PT - Assessment/Plan  PT Plan Current plan remains appropriate  PT Visit Diagnosis Unsteadiness on feet (R26.81);Other symptoms and signs involving the nervous system (R29.898)  Follow Up Recommendations Home health PT;Supervision/Assistance - 24 hour  PT equipment Rolling walker with 5" wheels  AM-PAC PT "6 Clicks" Mobility Outcome Measure (Version 2)  Help needed turning from your back to your side while in a flat bed without using bedrails? 4  Help needed moving from lying on your back to sitting on the side of a flat bed without using bedrails? 4  Help needed moving to and from a bed to a chair (including a wheelchair)? 3  Help needed standing up from a chair using your arms (e.g., wheelchair or bedside chair)? 3  Help needed  to walk in hospital room? 3  Help needed climbing 3-5 steps with a railing?  2  6 Click Score 19  Consider Recommendation of Discharge To: Home with [facility]  PT Goal Progression  Progress towards PT goals Progressing toward goals  Acute Rehab PT Goals  PT Goal Formulation With patient  Time For Goal  Achievement 08/01/18  Potential to Achieve Goals Good  PT Time Calculation  PT Start Time (ACUTE ONLY) 1453  PT Stop Time (ACUTE ONLY) 1507  PT Time Calculation (min) (ACUTE ONLY) 14 min  PT General Charges  $$ ACUTE PT VISIT 1 Visit  PT Treatments  $Gait Training 8-22 mins   07/19/2018  Donnella Sham, PT Acute Rehabilitation Services [phone]  (pager) [phone]  (office)

## 2018-07-19 NOTE — Progress Notes (Signed)
Patient is discharging home with home health. All discharge paperwork went over with patient and wife. All questions and concerns addressed. All belongings sent with patient and wife. IVs taken out, tele taken out. Son is coming for transport. Patient to be taken down in wheelchair. [place]

## 2018-07-19 NOTE — Progress Notes (Signed)
Inpatient Rehabilitation-Admissions Coordinator   Received request to return to pt room to review CIR program again. Pt and wife requesting check of insurance benefits for CIR. AC will return with benefits once received to see if pt and wife interested in [place] program at this time.   Jhonnie Garner, OTR/L  Rehab Admissions Coordinator  [phone] 07/19/2018 10:41 AM'

## 2018-07-23 ENCOUNTER — Other Ambulatory Visit: Payer: Self-pay

## 2018-07-23 NOTE — Patient Outreach (Signed)
[place] [facility]) Care Management  07/23/2018  ARGYLE GUSTAFSON April 08, 1951 [id number]  EMMI: stroke red alert Referral date: 07/23/18 Referral reason: Feeling worse overall: yes,   Questions/ problems with medications: yes Insurance: [employer] Day # 3  Telephone call to patient regarding EMMI stroke red alert. HIPAA verified with patient. Explained reason for call.  Patient states he is not feeling worse and he is taking his medications as prescribed. Patient denies any new symptoms.  Patient reports he is waiting for a return call from his primary MD office regarding his appointment. He states he has an appointment with the neurologist scheduled for 08/27/18.  Patient states he has transportation to his appointments. Patient states he has assistance at home with his care when needed. Patient denies any further needs or concerns at this time.  RNCM discussed signs/ symptoms of stroke with patient. Advised patient that 911 should be called for stroke like symptoms.  RNCM advised patient to notify MD of any changes in condition prior to scheduled appointment.  PLAN:  RNCM will close patient due to patient being assessed and having no further needs.   [name] RN,BSN,CCM [facility] Telephonic  [phone]

## 2018-07-23 NOTE — Progress Notes (Signed)
Carelink Summary Report / Loop Recorder 

## 2018-07-29 ENCOUNTER — Other Ambulatory Visit: Payer: Self-pay

## 2018-07-29 NOTE — Patient Outreach (Signed)
[place] [facility]) Care Management  07/29/2018  Randy Shepherd June 15, 1950 [id number]  EMMI: stroke red alert Referral date: 07/29/18 Referral reason: lost interest in things they use to enjoy? Insurance: [employer] Day # 9  Telephone call to patient regarding EMMI stroke red alert.  HIPAA verified with patient. Explained reason for call. Patient states he has no lost of  interest in things he use to do.  Patient states, " I am doing fine."  Patient states he continues  To take his medication as prescribed.  Patient states he has spoke with his doctors office for follow up since discharge.  Patient denies any new symptoms or concerns at this time.  RNCM advised patient to notify MD of any changes in condition prior to scheduled appointment. RNCM verified patient aware of 911 services for urgent/ emergent needs.   PLAN: RNCM will close patient due to patient being assessed and having no further needs.   [name] RN,BSN,CCM [facility] Telephonic  [phone]

## 2018-08-15 ENCOUNTER — Ambulatory Visit (INDEPENDENT_AMBULATORY_CARE_PROVIDER_SITE_OTHER): Payer: No Typology Code available for payment source | Admitting: *Deleted

## 2018-08-15 ENCOUNTER — Other Ambulatory Visit: Payer: Self-pay

## 2018-08-15 DIAGNOSIS — I6300 Cerebral infarction due to thrombosis of unspecified precerebral artery: Secondary | ICD-10-CM | POA: Diagnosis not present

## 2018-08-15 LAB — CUP PACEART REMOTE DEVICE CHECK
Date Time Interrogation Session: 20200416200707
Implantable Pulse Generator Implant Date: 20181214

## 2018-08-21 ENCOUNTER — Telehealth: Payer: Self-pay

## 2018-08-21 NOTE — Progress Notes (Signed)
Carelink Summary Report / Loop Recorder 

## 2018-08-21 NOTE — Telephone Encounter (Signed)
I called pt about his appt on 08/27/2018 at 215pm. I stated his hospital appt will be change to video due to [place] [employer]. Pt gave verbal consent for video and to file insurance. Pts email was verified twice by nurse and confirmed. Pt wrote down [email or web address] to download on his phone. Pt knows he will get a link email to join visit couple days prior. I advise pt on day of visit join meeting 5 minutes prior. Pt verbalized understanding.

## 2018-08-22 NOTE — Telephone Encounter (Signed)
Link was sent to email to join meeting on 08/22/2018 at 1058am.

## 2018-08-27 ENCOUNTER — Inpatient Hospital Stay: Payer: Medicare Other | Admitting: Adult Health

## 2018-08-27 ENCOUNTER — Telehealth: Payer: Self-pay | Admitting: Adult Health

## 2018-08-27 ENCOUNTER — Other Ambulatory Visit: Payer: Self-pay

## 2018-08-27 NOTE — Telephone Encounter (Signed)
Patient initially scheduled today for a face-to-face hospital follow-up visit regarding recent stroke but due to COVID-19 safety precautions, visit transition to telemedicine via WebEx with patient's consent at 2:15 PM today.  Unfortunately, he did not show during appointment time.  At this time, patient will be responsible for rescheduling hospital follow-up visit.

## 2018-08-28 NOTE — Telephone Encounter (Signed)
Patient called in stating he wants to r/s because he needs to get release to go back to work.

## 2018-08-28 NOTE — Telephone Encounter (Signed)
I called pt that his appt can be reschedule for a video visit. Pt stated he forgot about the visit on yesterday with Janett Billow NP. Pt stated he needs a letter for work I stated the video visit will have to be done with Janett Billow NP first to assess him. Pt gave verbal consent for video visit and to file insurance. His phone service is t mobile to send text message too. I stated will send him a text message that will have a link click on and join the meeting. It will ask for him to type in his first and last name. He will than see provider. I stated he will need to log on 5 to 10 minutes prior to the visit on 09/02/2018 at 145pm. Pt verbalized understanding.

## 2018-09-02 ENCOUNTER — Ambulatory Visit: Payer: No Typology Code available for payment source | Admitting: Adult Health

## 2018-09-02 ENCOUNTER — Other Ambulatory Visit: Payer: Self-pay

## 2018-09-02 ENCOUNTER — Ambulatory Visit: Payer: Self-pay | Admitting: Adult Health

## 2018-09-02 NOTE — Telephone Encounter (Signed)
Pt called stating he was unable to access the link for a vv- Per NP Janett Billow we needed to schedule for a in office visit in July due to this being the 3rd time the pt has missed a vv. Offered pt an appt for July but he demanded we cancel everything that he would need to be seen sooner than that to be released back to work. Pt stated "he would figure it out on his own".

## 2018-09-02 NOTE — Progress Notes (Deleted)
[facility] [address] [place]. [place] [address] [phone]       VIRTUAL VISIT FOLLOW UP NOTE  Randy Shepherd Date of Birth:  1950/12/12 Medical Record Number:  [id number]   Reason for Referral:  hospital stroke follow up    Virtual Visit via Video Note  I connected with Randy Shepherd on 09/02/18 at  1:45 PM EDT by a video enabled telemedicine application located remotely in my own home and verified that I am speaking with the correct person using two identifiers who was located at their own home.   Visit scheduled by ***. She discussed the limitations of evaluation and management by telemedicine and the availability of in person appointments. The patient expressed understanding and agreed to proceed.Please see telephone note for additional scheduling information and consent.    CHIEF COMPLAINT:  No chief complaint on file.   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 History obtained from *** and chart review. Reviewed all radiology images and labs personally.  Randy Shepherd is a 68 y.o. male with history of previous stroke with full recovery (previously followed in this office) who presented with slurred speech and facial droop.  CT head negative for acute infarct.  CTA negative for LVO but did show mild arthrosclerosis involving bilateral ICA, L VA, BA, left MCA and left PCA.  MRI brain reviewed and showed right pontine infarct and small vessel disease.  2D echo with bubble unremarkable.  LDL 123 and A1c 6.2.  Infarct secondary to small vessel disease with multiple vascular risk factors.  Previously on Plavix in the past the patient had discontinued due to running out of prescription.  Recommended DAPT for 3 weeks  and Plavix alone.  HTN stable.  Discontinue simvastatin 40 mg daily and initiated atorvastatin 40 mg daily for better HDL management.  Prediabetes and recommended ongoing follow-up with PCP.  Other stroke risk factors include advanced age, former tobacco use, EtOH use, obesity and history of stroke.    ROS:   14 system review of systems performed and negative with exception of ***  PMH:  Past Medical History:  Diagnosis Date   Abscess 10/07/2012   RT KNEE   Degenerative disc disease    Depression    DJD (degenerative joint disease)    High cholesterol    Hx of adenomatous colonic polyps 09/26/2017   Hypertension    Hypothyroidism    Stroke (HCC)     PSH:  Past Surgical History:  Procedure Laterality Date   APPENDECTOMY     COLONOSCOPY     IR ANGIO INTRA EXTRACRAN SEL COM CAROTID INNOMINATE BILAT MOD SED  04/11/2017   IR ANGIO VERTEBRAL SEL SUBCLAVIAN INNOMINATE UNI R MOD SED  04/11/2017   IR ANGIO VERTEBRAL SEL VERTEBRAL UNI L MOD SED  04/11/2017   IR RADIOLOGIST EVAL & MGMT  05/23/2017   LOOP RECORDER INSERTION N/A 04/13/2017   Procedure: LOOP RECORDER INSERTION;  Surgeon: Thompson Grayer, MD;  Location: [facility];  Service: Cardiovascular;  Laterality: N/A;   lpop recorder     RADIOLOGY WITH ANESTHESIA N/A 04/11/2017   Procedure: IR WITH ANESTHESIA;  Surgeon: Luanne Bras, MD;  Location: [facility];  Service: Radiology;  Laterality: N/A;    Social History:  Social History   Socioeconomic  History   Marital status: Married    Spouse name: Not on file   Number of children: 3   Years of education: Not on file   Highest education level: Not on file  Occupational History   Occupation: Solicitor strain: Not on file   Food insecurity:    Worry: Not on file    Inability: Not on file   Transportation needs:    Medical: Not on file    Non-medical: Not on file  Tobacco Use   Smoking status: Former Smoker      Last attempt to quit: 07/17/1987    Years since quitting: 31.1   Smokeless tobacco: Never Used  Substance and Sexual Activity   Alcohol use: Yes    Alcohol/week: 3.0 standard drinks    Types: 3 Cans of beer per week    Comment: DAILY BEER   Drug use: No   Sexual activity: Not on file  Lifestyle   Physical activity:    Days per week: Not on file    Minutes per session: Not on file   Stress: Not on file  Relationships   Social connections:    Talks on phone: Not on file    Gets together: Not on file    Attends religious service: Not on file    Active member of club or organization: Not on file    Attends meetings of clubs or organizations: Not on file    Relationship status: Not on file   Intimate partner violence:    Fear of current or ex partner: Not on file    Emotionally abused: Not on file    Physically abused: Not on file    Forced sexual activity: Not on file  Other Topics Concern   Not on file  Social History Narrative   Not on file    Family History:  Family History  Problem Relation Age of Onset   Cancer Mother        type unknown   Heart disease Brother    Throat cancer Brother    Heart disease Brother     Medications:   Current Outpatient Medications on File Prior to Visit  Medication Sig Dispense Refill   amLODipine (NORVASC) 10 MG tablet Take 10 mg by mouth daily.      ARIPiprazole (ABILIFY) 5 MG tablet Take 5 mg by mouth daily.     aspirin EC 81 MG tablet Take 1 tablet (81 mg total) by mouth daily. 21 tablet 0   atorvastatin (LIPITOR) 40 MG tablet Take 1 tablet (40 mg total) by mouth daily at 6 PM. 30 tablet 0   benazepril (LOTENSIN) 20 MG tablet Take 1 tablet (20 mg total) by mouth 2 (two) times daily. 60 tablet 1   buPROPion (WELLBUTRIN SR) 150 MG 12 hr tablet Take 150 mg by mouth 2 (two) times daily.     clopidogrel (PLAVIX) 75 MG tablet Take 1 tablet (75 mg total) by mouth daily. 30 tablet 1   gabapentin (NEURONTIN) 300  MG capsule Take 300-600 mg by mouth See admin instructions. Take 300 mg by mouth four times daily, then take 600 mg by mouth at bedtime.     levothyroxine (SYNTHROID, LEVOTHROID) 200 MCG tablet Take 200 mcg by mouth daily before breakfast.      sertraline (ZOLOFT) 100 MG tablet Take 200 mg by mouth every morning.     No current facility-administered medications on file prior to visit.  Allergies:  No Known Allergies   Physical Exam  There were no vitals filed for this visit. There is no height or weight on file to calculate BMI. No exam data present  Depression screen [facility] 2/9 10/17/2012  Decreased Interest 3  Down, Depressed, Hopeless 3  PHQ - 2 Score 6  Altered sleeping 0  Tired, decreased energy 0  Change in appetite 0  Feeling bad or failure about yourself  0  Trouble concentrating 0  Moving slowly or fidgety/restless 0  Suicidal thoughts 0  PHQ-9 Score 6     General: well developed, well nourished, seated, in no evident distress Head: head normocephalic and atraumatic.     Neurologic Exam Mental Status: Awake and fully alert. Oriented to place and time. Recent and remote memory intact. Attention span, concentration and fund of knowledge appropriate. Mood and affect appropriate.  Cranial Nerves: Fundoscopic exam reveals sharp disc margins. Pupils equal, briskly reactive to light. Extraocular movements full without nystagmus. Visual fields full to confrontation. Hearing intact. Facial sensation intact. Face, tongue, palate moves normally and symmetrically.  Motor: Normal bulk and tone. Normal strength in all tested extremity muscles. Sensory.: intact to touch , pinprick , position and vibratory sensation.  Coordination: Rapid alternating movements normal in all extremities. Finger-to-nose and heel-to-shin performed accurately bilaterally. Gait and Station: Arises from chair without difficulty. Stance is normal. Gait demonstrates normal stride length and balance. Able to  heel, toe and tandem walk without difficulty.  Reflexes: 1+ and symmetric. Toes downgoing.    NIHSS  *** Modified Rankin  ***   Diagnostic Data (Labs, Imaging, Testing)  CT HEAD WO CONTRAST 07/17/2018 IMPRESSION: 1. Stable and negative noncontrast CT appearance of the brain. 2. ASPECTS is 10.  CT ANGIO HEAD W OR WO CONTRAST CT ANGIO NECK W OR WO CONTRAST 07/17/2018 IMPRESSION: 1. Negative for large vessel occlusion 2. Mild atherosclerotic disease carotid bifurcation bilaterally without significant stenosis. Fetal origin of the posterior cerebral arteries bilaterally with small vertebral arteries and small basilar artery. Mild stenosis distal left vertebral artery and mild to moderate disease throughout the basilar. 3. Mild intracranial atherosclerotic disease involving the left middle cerebral artery and left posterior cerebral artery.  MR BRAIN WO CONTRAST 07/17/2018 IMPRESSION: Developing acute infarction within the right side of the pons. No hemorrhage or mass effect. Small vessel ischemic changes elsewhere as described above.  ECHOCARDIOGRAM 07/17/2018 IMPRESSIONS  1. The left ventricle has normal systolic function with an ejection fraction of 60-65%. The cavity size was normal. There is mildly increased left ventricular wall thickness. Left ventricular diastolic Doppler parameters are consistent with  pseudonormalization.  2. The right ventricle has normal systolic function. The cavity was normal. There is no increase in right ventricular wall thickness.  3. The aortic valve is tricuspid Mild thickening of the aortic valve Mild calcification of the aortic valve. no stenosis of the aortic valve.  4. The interatrial septum was not assessed.      ASSESSMENT: Randy Shepherd is a 68 y.o. year old male here with right pontine infarct on 07/17/2018 secondary to small vessel disease. Vascular risk factors include HTN, HLD, pre-DM, intracranial arthrosclerosis, OSA and  prior stroke.     PLAN:  1. Right pontine infarct: Continue clopidogrel 75 mg daily  and atorvastatin 40 mg daily for secondary stroke prevention. Maintain strict control of hypertension with blood pressure goal below 130/90, diabetes with hemoglobin A1c goal below 6.5% and cholesterol with LDL cholesterol (bad cholesterol) goal below 70 mg/dL.  I also advised the patient to eat a healthy diet with plenty of whole grains, cereals, fruits and vegetables, exercise regularly with at least 30 minutes of continuous activity daily and maintain ideal body weight. 2. HTN: Advised to continue current treatment regimen.  Today's BP ***.  Advised to continue to monitor at home along with continued follow-up with PCP for management 3. HLD: Advised to continue current treatment regimen along with continued follow-up with PCP for future prescribing and monitoring of lipid panel 4. Pre-DMII: Ongoing monitoring by PCP 5. Intracranial arthrosclerosis: 6. OSA:    Follow up in *** or call earlier if needed   Greater than 50% of time during this 45 minute visit was spent on counseling, explanation of diagnosis of right pontine infarct, reviewing risk factor management of HTN, HLD, pre-DM, OSA and intracranial arthrosclerosis, planning of further management along with potential future management, and discussion with patient and family answering all questions.    Venancio Poisson, AGNP-BC  [facility] [address] [place], [place] [address]  Phone [phone] Fax [phone] Note: This document was prepared with digital dictation and possible smart phrase technology. Any transcriptional errors that result from this process are unintentional.

## 2018-09-03 ENCOUNTER — Other Ambulatory Visit: Payer: Self-pay

## 2018-09-03 ENCOUNTER — Ambulatory Visit (INDEPENDENT_AMBULATORY_CARE_PROVIDER_SITE_OTHER): Payer: Medicare Other | Admitting: Neurology

## 2018-09-03 ENCOUNTER — Telehealth: Payer: Self-pay

## 2018-09-03 ENCOUNTER — Telehealth: Payer: Self-pay | Admitting: *Deleted

## 2018-09-03 DIAGNOSIS — I6381 Other cerebral infarction due to occlusion or stenosis of small artery: Secondary | ICD-10-CM

## 2018-09-03 DIAGNOSIS — R471 Dysarthria and anarthria: Secondary | ICD-10-CM

## 2018-09-03 NOTE — Telephone Encounter (Signed)
We can give him 1 more chance to trial telemedicine visit via [email or web address].  Advised him that a link will be sent to him 5 to 10 minutes prior to start of appointment time.

## 2018-09-03 NOTE — Telephone Encounter (Signed)
Letter given to Hilda Blades to mail or fax for patient. PEr Dr.Sethi pt can work full time effective 09/04/2018 no restrictions.

## 2018-09-03 NOTE — Telephone Encounter (Signed)
Only schedule with Dr. Leonie Man please thanks.

## 2018-09-03 NOTE — Telephone Encounter (Signed)
I scheduled to Dr. Leonie Man today at 1300.  [email or web address] email sent to pt and I called to make sure received.  He did.  I relayed to he will be call 30 min prior to appt by check in and then he will need to check in (by clicking link at 10 minutes prior to appt to be placed in the waiting room).  He verbalized understanding.

## 2018-09-03 NOTE — Progress Notes (Signed)
Virtual Visit via Video Note  I connected with Randy Shepherd on 09/03/18 at  1:00 PM EDT by a video enabled telemedicine application and verified that I am speaking with the correct person using two identifiers.  This visit was performed using face time.  Patient's son was present for part of this visit.  Location: Patient:at his home  Provider: at [facility] office  I discussed the limitations of evaluation and management by telemedicine and the availability of in person appointments. The patient expressed understanding and agreed to proceed.  History of Present Illness: Randy Shepherd is seen today for initial office virtual video visit following hospital admission for stroke in March 2020.  History is obtained from the patient and review of electronic medical records.  I personally reviewed imaging films.  He presented to [facility] on 07[facility]17/2018 with sudden onset of left facial droop and slurred speech.  This began at work and he presented beyond time window for TPA with a very low NIH stroke scale of 2.  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  At that time he was enrolled in the stroke atrial fibrillation study and treated with dual antiplatelet therapy for 3 months followed by Plavix alone.  During the current admission MRI scan was obtained which showed a right pontine lacunar infarct.  LDL cholesterol was elevated at 123 mg percent.  Patient was on Zocor which was switched to Lipitor.   Hemoglobin A1c was borderline at 6.2.  CT angiogram of the head and neck showed mild atherosclerosis involving bilateral ICA left vertebral artery and basilar artery as well as left MCA and PCAs.  Transthoracic echo showed normal ejection fraction without cardiac source of embolism.  Patient had been on Plavix in the past but had ran out of it and at the time of the stroke had not been on any antiplatelet  agents.  Patient had additional risk factor of obesity as well as sleep apnea for which he was taking CPAP.  Patient was seen by physical occupational therapy and discharged home with home speech therapy which he still getting.  He states his facial weakness has improved.  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  He has been tolerating Plavix without bleeding or bruising.  He states his blood pressure is under good control.  Is tolerating Lipitor well without any side effects.  He has no other complaints. Observations/Objective: Physical and neurological exam was limited due to constraints from video visit.  Pleasant obese middle-aged Caucasian male not in distress.  He is awake alert oriented x3 with normal speech and language except mild dysarthria on few words.  He can be easily understood.  He follows commands well.  Extraocular movements are full range without nystagmus.  Face is symmetric with only subtle left nasolabial fold asymmetry when he smiles.  Tongue is midline.  Motor system exam symmetric upper and lower extremity strength without focal weakness.  Finger-to-nose coordination is symmetric bilaterally.  He is able to get up easily and walks with a fairly steady gait without ataxia.  NIHSS 2 MRS 2 Assessment and Plan: 68 year old Caucasian male with right pontine infarct in March 2020 secondary to small vessel disease with multiple vascular risk factors of obesity, hypertension, hyperlipidemia, intracranial atherosclerosis and sleep apnea. Patient has done well and has only mild dysarthria is residual from his  recent stroke  Follow Up Instructions: I had a long discussion with the patient regarding his recent stroke and discuss risk for recurrent stroke and stroke prevention strategies and answered questions.  Continue Plavix for stroke prevention and strict control of hypertension with blood pressure goal below 130/90, lipids with LDL  cholesterol goal below 70 mg percent and diabetes with hemoglobin A1c goal below 6.2%.  I also encouraged him to use his CPAP every night for sleep apnea.  He was also encouraged to eat a healthy diet with lots of fruits, vegetables, cereals, whole grains and to be active and exercise regularly and lose weight. The patient was neurologically cleared to return to work without restrictions starting 09/04/2018 He will return for follow-up in the future in 3 months with my nurse practitioner Janett Billow or call earlier if necessary.   I discussed the assessment and treatment plan with the patient. The patient was provided an opportunity to ask questions and all were answered. The patient agreed with the plan and demonstrated an understanding of the instructions.   The patient was advised to call back or seek an in-person evaluation if the symptoms worsen or if the condition fails to improve as anticipated.  I provided 25 minutes of non-face-to-face time during this encounter.   Randy Contras, MD

## 2018-09-03 NOTE — Telephone Encounter (Signed)
LMVM for pt to return call for a Video appt with Dr. Leonie Man this afternoon.

## 2018-09-03 NOTE — Telephone Encounter (Signed)
Pt called in and stated he needs a call back to get released for work  CB# [phone]

## 2018-09-03 NOTE — Telephone Encounter (Signed)
I called pt, LVM unable to reach the patient.

## 2018-09-17 ENCOUNTER — Ambulatory Visit (INDEPENDENT_AMBULATORY_CARE_PROVIDER_SITE_OTHER): Payer: No Typology Code available for payment source | Admitting: *Deleted

## 2018-09-17 ENCOUNTER — Other Ambulatory Visit: Payer: Self-pay

## 2018-09-17 DIAGNOSIS — I6300 Cerebral infarction due to thrombosis of unspecified precerebral artery: Secondary | ICD-10-CM

## 2018-09-18 LAB — CUP PACEART REMOTE DEVICE CHECK
Date Time Interrogation Session: 20200519234035
Implantable Pulse Generator Implant Date: 20181214

## 2018-09-25 ENCOUNTER — Telehealth: Payer: Self-pay | Admitting: *Deleted

## 2018-09-25 NOTE — Telephone Encounter (Signed)
STROKE~AF research study. Patient called and informed to complete a manual transmission for the study month 18 follow up visit. Patient Had CVA in Mar and medication changes. 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

## 2018-09-26 NOTE — Progress Notes (Signed)
Carelink Summary Report / Loop Recorder 

## 2018-10-10 ENCOUNTER — Telehealth: Payer: Self-pay | Admitting: *Deleted

## 2018-10-10 NOTE — Telephone Encounter (Signed)
Called patient to remind him to transmit tonight with his loop recorder for the [facility] research study.

## 2018-10-11 ENCOUNTER — Telehealth: Payer: Self-pay | Admitting: *Deleted

## 2018-10-11 NOTE — Telephone Encounter (Signed)
Spoke with patient about manuel transmission for loop recorder. He transmitted last night but the report is not showing on care link site. Patient transmitted again while on phone and completed

## 2018-10-21 ENCOUNTER — Ambulatory Visit (INDEPENDENT_AMBULATORY_CARE_PROVIDER_SITE_OTHER): Payer: No Typology Code available for payment source | Admitting: *Deleted

## 2018-10-21 DIAGNOSIS — I6300 Cerebral infarction due to thrombosis of unspecified precerebral artery: Secondary | ICD-10-CM

## 2018-10-21 LAB — CUP PACEART REMOTE DEVICE CHECK
Date Time Interrogation Session: 20200621233900
Implantable Pulse Generator Implant Date: 20181214

## 2018-10-29 NOTE — Progress Notes (Signed)
Carelink Summary Report / Loop Recorder 

## 2018-10-30 ENCOUNTER — Other Ambulatory Visit: Payer: Self-pay | Admitting: Internal Medicine

## 2018-10-31 ENCOUNTER — Encounter: Payer: No Typology Code available for payment source | Admitting: *Deleted

## 2018-10-31 ENCOUNTER — Other Ambulatory Visit: Payer: Self-pay

## 2018-10-31 DIAGNOSIS — Z006 Encounter for examination for normal comparison and control in clinical research program: Secondary | ICD-10-CM

## 2018-10-31 NOTE — Research (Signed)
STROKE~AF Research study month 18 follow up visit completed. Patient doing well. He was admitted 07-17-2018---07-19-2018 for Stroke recurrence. He was out of his medication for 2 weeks prior to admission. He was restarted on all medications including Plavix. I encouraged patient to call the research office if he runs out again that we may could help find alternative was to obtain medication if he can't afford it. He was fired form his job when he had his Stroke. Loop interrogated. Next research required visit is between 14/nov/2020---09/jan/2021.  Current Outpatient Medications on File Prior to Visit  Medication Sig Dispense Refill  . amLODipine (NORVASC) 10 MG tablet Take 10 mg by mouth daily.     . ARIPiprazole (ABILIFY) 5 MG tablet Take 5 mg by mouth daily.    Marland Kitchen atorvastatin (LIPITOR) 40 MG tablet Take 1 tablet (40 mg total) by mouth daily at 6 PM. 30 tablet 0  . benazepril (LOTENSIN) 20 MG tablet Take 1 tablet (20 mg total) by mouth 2 (two) times daily. 60 tablet 1  . buPROPion (WELLBUTRIN SR) 150 MG 12 hr tablet Take 150 mg by mouth 2 (two) times daily.    . clopidogrel (PLAVIX) 75 MG tablet Take 1 tablet (75 mg total) by mouth daily. 30 tablet 1  . gabapentin (NEURONTIN) 300 MG capsule Take 300-600 mg by mouth See admin instructions. Take 300 mg by mouth four times daily, then take 600 mg by mouth at bedtime.    Marland Kitchen levothyroxine (SYNTHROID, LEVOTHROID) 200 MCG tablet Take 200 mcg by mouth daily before breakfast.     . sertraline (ZOLOFT) 100 MG tablet Take 200 mg by mouth every morning.     No current facility-administered medications on file prior to visit.   Marland Kitchen

## 2018-11-22 ENCOUNTER — Ambulatory Visit (INDEPENDENT_AMBULATORY_CARE_PROVIDER_SITE_OTHER): Payer: No Typology Code available for payment source | Admitting: *Deleted

## 2018-11-22 DIAGNOSIS — I6300 Cerebral infarction due to thrombosis of unspecified precerebral artery: Secondary | ICD-10-CM

## 2018-11-23 LAB — CUP PACEART REMOTE DEVICE CHECK
Date Time Interrogation Session: 20200725001137
Implantable Pulse Generator Implant Date: 20181214

## 2018-11-29 NOTE — Progress Notes (Signed)
Carelink Summary Report / Loop Recorder 

## 2018-12-25 ENCOUNTER — Ambulatory Visit (INDEPENDENT_AMBULATORY_CARE_PROVIDER_SITE_OTHER): Payer: Medicare Other | Admitting: *Deleted

## 2018-12-25 DIAGNOSIS — I6300 Cerebral infarction due to thrombosis of unspecified precerebral artery: Secondary | ICD-10-CM | POA: Diagnosis not present

## 2018-12-26 LAB — CUP PACEART REMOTE DEVICE CHECK
Date Time Interrogation Session: 20200827004044
Implantable Pulse Generator Implant Date: 20181214

## 2019-01-02 NOTE — Progress Notes (Signed)
Carelink Summary Report / Loop Recorder 

## 2019-01-27 ENCOUNTER — Ambulatory Visit (INDEPENDENT_AMBULATORY_CARE_PROVIDER_SITE_OTHER): Payer: Medicare Other | Admitting: *Deleted

## 2019-01-27 DIAGNOSIS — I6300 Cerebral infarction due to thrombosis of unspecified precerebral artery: Secondary | ICD-10-CM | POA: Diagnosis not present

## 2019-01-28 LAB — CUP PACEART REMOTE DEVICE CHECK
Date Time Interrogation Session: 20200929184518
Implantable Pulse Generator Implant Date: 20181214

## 2019-02-05 NOTE — Progress Notes (Signed)
Carelink Summary Report / Loop Recorder 

## 2019-02-10 ENCOUNTER — Emergency Department (HOSPITAL_COMMUNITY): Payer: No Typology Code available for payment source

## 2019-02-10 ENCOUNTER — Other Ambulatory Visit: Payer: Self-pay

## 2019-02-10 ENCOUNTER — Emergency Department (HOSPITAL_COMMUNITY)
Admission: EM | Admit: 2019-02-10 | Discharge: 2019-02-10 | Disposition: A | Discharge status: Home / Self Care | Payer: No Typology Code available for payment source | Attending: Emergency Medicine | Admitting: Emergency Medicine

## 2019-02-10 DIAGNOSIS — S59912A Unspecified injury of left forearm, initial encounter: Secondary | ICD-10-CM | POA: Diagnosis present

## 2019-02-10 DIAGNOSIS — Y939 Activity, unspecified: Secondary | ICD-10-CM | POA: Insufficient documentation

## 2019-02-10 DIAGNOSIS — S52122A Displaced fracture of head of left radius, initial encounter for closed fracture: Secondary | ICD-10-CM | POA: Diagnosis not present

## 2019-02-10 DIAGNOSIS — S20212A Contusion of left front wall of thorax, initial encounter: Secondary | ICD-10-CM

## 2019-02-10 DIAGNOSIS — Z7901 Long term (current) use of anticoagulants: Secondary | ICD-10-CM | POA: Insufficient documentation

## 2019-02-10 DIAGNOSIS — E039 Hypothyroidism, unspecified: Secondary | ICD-10-CM | POA: Diagnosis not present

## 2019-02-10 DIAGNOSIS — Z79899 Other long term (current) drug therapy: Secondary | ICD-10-CM | POA: Diagnosis not present

## 2019-02-10 DIAGNOSIS — Z87891 Personal history of nicotine dependence: Secondary | ICD-10-CM | POA: Diagnosis not present

## 2019-02-10 DIAGNOSIS — Z8673 Personal history of transient ischemic attack (TIA), and cerebral infarction without residual deficits: Secondary | ICD-10-CM | POA: Diagnosis not present

## 2019-02-10 DIAGNOSIS — W19XXXA Unspecified fall, initial encounter: Secondary | ICD-10-CM | POA: Diagnosis not present

## 2019-02-10 DIAGNOSIS — Y999 Unspecified external cause status: Secondary | ICD-10-CM | POA: Diagnosis not present

## 2019-02-10 DIAGNOSIS — Y92018 Other place in single-family (private) house as the place of occurrence of the external cause: Secondary | ICD-10-CM | POA: Diagnosis not present

## 2019-02-10 DIAGNOSIS — I10 Essential (primary) hypertension: Secondary | ICD-10-CM | POA: Insufficient documentation

## 2019-02-10 MED ORDER — HYDROCODONE-ACETAMINOPHEN 5-325 MG PO TABS
1.0000 | ORAL_TABLET | Freq: Once | ORAL | Status: AC
  Administered 2019-02-10: 12:00:00 1 via ORAL
  Filled 2019-02-10: qty 1

## 2019-02-10 MED ORDER — OXYCODONE-ACETAMINOPHEN 5-325 MG PO TABS: 1.0000 | ORAL_TABLET | Freq: Three times a day (TID) | ORAL | 0 refills | Status: DC | PRN

## 2019-02-10 NOTE — Progress Notes (Signed)
Orthopedic Tech Progress Note Patient Details:  Randy Shepherd Jan 01, 1951 [id number]  Ortho Devices Type of Ortho Device: Long arm splint, Arm sling Ortho Device/Splint Location: left Ortho Device/Splint Interventions: Application   Post Interventions Patient Tolerated: Well Instructions Provided: Care of device   [place] Pink 02/10/2019, 1:45 PM

## 2019-02-10 NOTE — Discharge Instructions (Signed)
You can take 1000 mg of Tylenol.  Do not exceed 4000 mg of Tylenol a day.  Take pain medications as directed for break through pain. Do not drive or operate machinery while taking this medication.   Make sure that you do not get the splint wet.  Elevate the arm as much as possible to help with edema.  Follow-up with referred orthopedic doctor.  Return the emergency department for any worsening pain, difficulty breathing, swelling of the arm, discoloration of the arm or any other worsening concerning symptoms.

## 2019-02-10 NOTE — ED Provider Notes (Signed)
[facility] Provider Note   CSN: [id number] Arrival date & time: 02/10/19  0844     History   Chief Complaint Chief Complaint  Patient presents with  . Arm Injury    HPI Randy Shepherd is a 68 y.o. male PMH/o CVA, DJD, HTN who presents for evaluation of LUE pain after a mechanical fall.  Patient reports that he was on the porch with his son last night and was rushing to get back into the house and tripped, causing him to fall.  He states he landed on his left side and feels like his arm was tucked up underneath him.  He denies any head injury or LOC.  He reports that since then, he has had pain to the left elbow and left wrist.  He states that he has to hold the elbow bent or else if he tries to straighten it, he has a lot of pain.  Also reports some pain to the left wrist.  Patient states he also has some pain to the posterior aspect of his left ribs.  No difficulty breathing.  Does not take any medications for his pain.  He reports he is on Plavix but no othe thinners.  Patient denies ny vision changes, chest pain, abdominal pain, nausea/vomiting, numbness/weakness of his arms or legs.      The history is provided by the patient.    Past Medical History:  Diagnosis Date  . Abscess 10/07/2012   RT KNEE  . Degenerative disc disease   . Depression   . DJD (degenerative joint disease)   . High cholesterol   . Hx of adenomatous colonic polyps 09/26/2017  . Hypertension   . Hypothyroidism   . Stroke [facility])     Patient Active Problem List   Diagnosis Date Noted  . 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 At high risk for falls 07/19/2018  . Hypokalemia 07/18/2018  . CVA (cerebral vascular accident) ([place]) 07/17/2018  . Hx of adenomatous colonic polyps 09/26/2017  . 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 Stroke (cerebrum) ([place]) 04/11/2017  . Cellulitis and abscess of leg 10/06/2012  . Hypertension 10/06/2012  . Hyperlipidemia 10/06/2012  . History of  stroke 10/06/2012  . Thyroid dysfunction 10/06/2012    Past Surgical History:  Procedure Laterality Date  . APPENDECTOMY    . COLONOSCOPY    . IR ANGIO INTRA EXTRACRAN SEL COM CAROTID INNOMINATE BILAT MOD SED  04/11/2017  . IR ANGIO VERTEBRAL SEL SUBCLAVIAN INNOMINATE UNI R MOD SED  04/11/2017  . IR ANGIO VERTEBRAL SEL VERTEBRAL UNI L MOD SED  04/11/2017  . IR RADIOLOGIST EVAL & MGMT  05/23/2017  . LOOP RECORDER INSERTION N/A 04/13/2017   Procedure: LOOP RECORDER INSERTION;  Surgeon: Thompson Grayer, MD;  Location: [facility];  Service: Cardiovascular;  Laterality: N/A;  . lpop recorder    . RADIOLOGY WITH ANESTHESIA N/A 04/11/2017   Procedure: IR WITH ANESTHESIA;  Surgeon: Luanne Bras, MD;  Location: [place];  Service: Radiology;  Laterality: N/A;        Home Medications    Prior to Admission medications   Medication Sig Start Date End Date Taking? Authorizing Provider  amLODipine (NORVASC) 10 MG tablet Take 10 mg by mouth daily.     [provider]  ARIPiprazole (ABILIFY) 5 MG tablet Take 5 mg by mouth daily.    [provider]  atorvastatin (LIPITOR) 40 MG tablet Take 1 tablet (40 mg total) by mouth daily at  6 PM. 07/19/18   Asencion Noble, MD  benazepril (LOTENSIN) 20 MG tablet Take 1 tablet (20 mg total) by mouth 2 (two) times daily. 04/13/17   [name], NP  buPROPion (WELLBUTRIN SR) 150 MG 12 hr tablet Take 150 mg by mouth 2 (two) times daily.    [provider]  clopidogrel (PLAVIX) 75 MG tablet Take 1 tablet (75 mg total) by mouth daily. 04/14/17   [name], NP  gabapentin (NEURONTIN) 300 MG capsule Take 300-600 mg by mouth See admin instructions. Take 300 mg by mouth four times daily, then take 600 mg by mouth at bedtime.    [provider]  levothyroxine (SYNTHROID, LEVOTHROID) 200 MCG tablet Take 200 mcg by mouth daily before breakfast.     [provider]  oxyCODONE-acetaminophen (PERCOCET/ROXICET)  5-325 MG tablet Take 1-2 tablets by mouth every 8 (eight) hours as needed for severe pain. 02/10/19   Volanda Napoleon, PA-C  sertraline (ZOLOFT) 100 MG tablet Take 200 mg by mouth every morning.    [provider]    Family History Family History  Problem Relation Age of Onset  . Cancer Mother        type unknown  . Heart disease Brother   . Throat cancer Brother   . Heart disease Brother     Social History Social History   Tobacco Use  . Smoking status: Former Smoker    Quit date: 07/17/1987    Years since quitting: 31.5  . Smokeless tobacco: Never Used  Substance Use Topics  . Alcohol use: Yes    Alcohol/week: 3.0 standard drinks    Types: 3 Cans of beer per week    Comment: DAILY BEER  . Drug use: No     Allergies   Patient has no known allergies.   Review of Systems Review of Systems  Respiratory: Negative for cough and shortness of breath.   Cardiovascular: Negative for chest pain.  Gastrointestinal: Negative for abdominal pain, nausea and vomiting.  Genitourinary: Negative for dysuria and hematuria.  Musculoskeletal:       Left-sided chest wall pain. Left elbow pain, left wrist pain  Neurological: Negative for numbness and headaches.  All other systems reviewed and are negative.    Physical Exam Updated Vital Signs BP 140/79 (BP Location: Right Arm)   Pulse 76   Temp 98.2 F (36.8 C) (Oral)   Resp 18   Ht 5\' 8"  (1.727 m)   SpO2 98%   BMI 37.58 kg/m   Physical Exam Vitals signs and nursing note reviewed.  Constitutional:      Appearance: Normal appearance. He is well-developed.  HENT:     Head: Normocephalic and atraumatic.  Eyes:     General: Lids are normal.     Conjunctiva/sclera: Conjunctivae normal.     Pupils: Pupils are equal, round, and reactive to light.     Comments: PERRL. EOMs intact. No nystagmus. No neglect.   Neck:     Musculoskeletal: Full passive range of motion without pain.     Comments: Full flexion/extension  and lateral movement of neck fully intact. No bony midline tenderness. No deformities or crepitus.  Cardiovascular:     Rate and Rhythm: Normal rate and regular rhythm.     Pulses: Normal pulses.          Radial pulses are 2+ on the right side and 2+ on the left side.     Heart sounds: Normal heart sounds. No  murmur. No friction rub. No gallop.   Pulmonary:     Effort: Pulmonary effort is normal.     Breath sounds: Normal breath sounds.     Comments: Lungs clear to auscultation bilaterally.  Symmetric chest rise.  No wheezing, rales, rhonchi. Chest:     Comments: Tenderness palpation noted to posterior left chest wall at approximately ribs 10, 1, 12.  No deformity or crepitus noted. Abdominal:     Palpations: Abdomen is soft. Abdomen is not rigid.     Tenderness: There is no abdominal tenderness. There is no guarding or rebound.     Comments: Abdomen is soft, non-distended, non-tender. No rigidity, No guarding. No peritoneal signs.  Musculoskeletal: Normal range of motion.     Comments: Patient left elbow.  No deformity or crepitus noted.  No overlying warmth, erythema, edema.  He is holding in slightly flexed position.  Difficulty extension of elbow.  No bony tenderness noted to left forearm.  Tenderness palpation noted to left wrist, particularly the radial aspect.  He does exhibit small amount of snuffbox tenderness noted.  No deformity or crepitus noted.  Able to wiggle his 5 fingers without any difficulty.  No tenderness palpation noted to right upper extremity.  Skin:    General: Skin is warm and dry.     Capillary Refill: Capillary refill takes less than 2 seconds.  Neurological:     Mental Status: He is alert and oriented to person, place, and time.     Comments: Cranial nerves III-XII intact Follows commands, Moves all extremities  5/5 strength to BUE and BLE  Sensation intact throughout all major nerve distributions No slurred speech. No facial droop.   Psychiatric:         Speech: Speech normal.      ED Treatments / Results  Labs (all labs ordered are listed, but only abnormal results are displayed) Labs Reviewed - No data to display  EKG None  Radiology Dg Ribs Unilateral W/chest Left  Result Date: 02/10/2019 CLINICAL DATA:  Left rib pain after a fall last night. EXAM: LEFT RIBS AND CHEST - 3+ VIEW COMPARISON:  Chest x-ray dated 04/11/2017 FINDINGS: No fracture or other bone lesions are seen involving the ribs. There is no evidence of pneumothorax or pleural effusion. Both lungs are clear. Heart size and mediastinal contours are within normal limits. Loop recorder in place. IMPRESSION: No acute abnormalities. Specifically, no visible rib fractures. Electronically Signed   By: Lorriane Shire M.D.   On: 02/10/2019 13:09   Dg Elbow Complete Left  Result Date: 02/10/2019 CLINICAL DATA:  Radial head fracture.  Fall yesterday. EXAM: LEFT ELBOW - COMPLETE 3+ VIEW COMPARISON:  Left forearm radiographs 02/10/2019 FINDINGS: A radial neck fracture is present. Radial head is intact. Elbow is located. A small effusion is present. IMPRESSION: Mildly displaced radial neck fracture is confirmed. Electronically Signed   By: San Morelle M.D.   On: 02/10/2019 11:10   Dg Forearm Left  Result Date: 02/10/2019 CLINICAL DATA:  Left arm pain after fall. EXAM: LEFT FOREARM - 2 VIEW COMPARISON:  None. FINDINGS: Possible nondisplaced left radial head fracture is noted. The ulna is unremarkable. No soft tissue abnormality is noted. IMPRESSION: Possible nondisplaced left radial head fracture. Dedicated radiographs of the left elbow are recommended. Electronically Signed   By: Marijo Conception M.D.   On: 02/10/2019 09:18   Dg Wrist Complete Left  Result Date: 02/10/2019 CLINICAL DATA:  Left wrist pain secondary to a fall last  night. EXAM: LEFT WRIST - COMPLETE 3+ VIEW COMPARISON:  None FINDINGS: There is no evidence of fracture or dislocation. Slight cystic degenerative  changes in several carpal bones. Slight arthritis of the first [facility] joint. Soft tissues are unremarkable. IMPRESSION: No acute abnormality. Arthritic changes in the carpal bones and first [facility] joint. Electronically Signed   By: Lorriane Shire M.D.   On: 02/10/2019 13:10    Procedures Procedures (including critical care time)  Medications Ordered in ED Medications  HYDROcodone-acetaminophen (NORCO/VICODIN) 5-325 MG per tablet 1 tablet (1 tablet Oral Given 02/10/19 1150)     Initial Impression / Assessment and Plan / ED Course  I have reviewed the triage vital signs and the nursing notes.  Pertinent labs & imaging results that were available during my care of the patient were reviewed by me and considered in my medical decision making (see chart for details).        68 year old male who presents for evaluation of left upper extremity pain after mechanical fall that occurred last night.  No head injury, LOC.  He is on Plavix.  Has not had any nausea/vomiting, difficulty walking.  Reports pain to his left elbow since the fall.  No vision changes.  Also reports pain to the posterior left ribs.  No difficulty breathing. Patient is afebrile, non-toxic appearing, sitting comfortably on examination table. Vital signs reviewed and stable. Patient is neurovascularly intact.  On exam, tenderness palpation of left elbow with difficulty with range of motion.  Also has tenderness palpation noted to posterior ribs on the left side.  No difficulty breathing.  Check x-rays.  Patient's fall happened last night.  No head injury, LOC.  It was witnessed.  No nausea/vomiting since then.  No indication for CT head or CT C spine.   Elbow x-ray shows mildly displaced radial head fracture. Wrist XR shows no evidence of fracture.  Chest x-ray negative for any acute rib fracture.  Given fracture, Suboxone dorsum wrist, will consult Ortho.  Discussed with Hilbert Odor, PA-C (ortho).  He reviewed films.  Patient stable  for follow-up on outpatient basis.  Agrees with plan for sugar tong splint.  Discussed results with patient.  Patient is agreement.  Reevaluation after splint placement.  Patient with good distal cap refill, sensation.  Encouraged at home supportive care measures. At this time, patient exhibits no emergent life-threatening condition that require further evaluation in ED or admission. Patient had ample opportunity for questions and discussion. All patient's questions were answered with full understanding. Strict return precautions discussed. Patient expresses understanding and agreement to plan.   Portions of this note were generated with L[employer]. Dictation errors may occur despite best attempts at proofreading.    Final Clinical Impressions(s) / ED Diagnoses   Final diagnoses:  Closed displaced fracture of head of left radius, initial encounter  Contusion of rib on left side, initial encounter    ED Discharge Orders         Ordered    oxyCODONE-acetaminophen (PERCOCET/ROXICET) 5-325 MG tablet  Every 8 hours PRN     02/10/19 1344           Desma Mcgregor 02/10/19 1634    Elnora Morrison, MD 02/10/19 1656

## 2019-02-10 NOTE — ED Triage Notes (Signed)
Pt reports left arm pain after falling last night. States he lost his balance. No deformities noted.

## 2019-03-02 LAB — CUP PACEART REMOTE DEVICE CHECK
Date Time Interrogation Session: 20201101181924
Implantable Pulse Generator Implant Date: 20181214

## 2019-03-03 ENCOUNTER — Ambulatory Visit (INDEPENDENT_AMBULATORY_CARE_PROVIDER_SITE_OTHER): Payer: No Typology Code available for payment source | Admitting: *Deleted

## 2019-03-03 DIAGNOSIS — I6300 Cerebral infarction due to thrombosis of unspecified precerebral artery: Secondary | ICD-10-CM | POA: Diagnosis not present

## 2019-03-18 ENCOUNTER — Telehealth: Payer: Self-pay | Admitting: *Deleted

## 2019-03-18 NOTE — Telephone Encounter (Signed)
STROKE~AF Research study: Left message for patient to complete a manuel transmission and to call office for month 24 research visit.

## 2019-03-25 NOTE — Progress Notes (Signed)
Carelink Summary Report / Loop Recorder 

## 2019-04-04 ENCOUNTER — Ambulatory Visit (INDEPENDENT_AMBULATORY_CARE_PROVIDER_SITE_OTHER): Payer: Medicare Other | Admitting: *Deleted

## 2019-04-04 DIAGNOSIS — I6300 Cerebral infarction due to thrombosis of unspecified precerebral artery: Secondary | ICD-10-CM | POA: Diagnosis not present

## 2019-04-05 LAB — CUP PACEART REMOTE DEVICE CHECK
Date Time Interrogation Session: 20201204134702
Implantable Pulse Generator Implant Date: 20181214

## 2019-05-07 ENCOUNTER — Ambulatory Visit (INDEPENDENT_AMBULATORY_CARE_PROVIDER_SITE_OTHER): Payer: Medicare Other | Admitting: *Deleted

## 2019-05-07 DIAGNOSIS — I6300 Cerebral infarction due to thrombosis of unspecified precerebral artery: Secondary | ICD-10-CM

## 2019-05-08 LAB — CUP PACEART REMOTE DEVICE CHECK
Date Time Interrogation Session: 20210106135947
Implantable Pulse Generator Implant Date: 20181214

## 2019-05-13 ENCOUNTER — Telehealth: Payer: Self-pay | Admitting: *Deleted

## 2019-05-13 NOTE — Telephone Encounter (Signed)
STROKE~AF Research study: Left message a few weeks ago and again today for patient to complete a manuel transmission with his loop recorder for the STROKE research study. He is now out of the required window. Instructed patient on how to complete transmission on message and to call office or my cell for any questions. I will complete the research required forms.

## 2019-05-14 ENCOUNTER — Telehealth: Payer: Self-pay | Admitting: *Deleted

## 2019-06-09 ENCOUNTER — Ambulatory Visit (INDEPENDENT_AMBULATORY_CARE_PROVIDER_SITE_OTHER): Payer: Medicare Other | Admitting: *Deleted

## 2019-06-09 DIAGNOSIS — I6300 Cerebral infarction due to thrombosis of unspecified precerebral artery: Secondary | ICD-10-CM

## 2019-06-09 LAB — CUP PACEART REMOTE DEVICE CHECK
Date Time Interrogation Session: 20210208004517
Implantable Pulse Generator Implant Date: 20181214

## 2019-06-10 NOTE — Progress Notes (Signed)
ILR Remote 

## 2019-06-10 NOTE — Telephone Encounter (Signed)
Called patient and he said he was going to do a transmission tonight. I will check in am and make sure that it was transmitted.  Reviewed the questions with him and thanked him for speaking with me. He said that he was short of breath but he has been short of breath for 3 years so nothing has changed. No med changes per patient and no OV has been completed due to covid per patient.    Current Outpatient Medications:  .  amLODipine (NORVASC) 10 MG tablet, Take 10 mg by mouth daily. , Disp: , Rfl:  .  ARIPiprazole (ABILIFY) 5 MG tablet, Take 5 mg by mouth daily., Disp: , Rfl:  .  atorvastatin (LIPITOR) 40 MG tablet, Take 1 tablet (40 mg total) by mouth daily at 6 PM., Disp: 30 tablet, Rfl: 0 .  benazepril (LOTENSIN) 20 MG tablet, Take 1 tablet (20 mg total) by mouth 2 (two) times daily., Disp: 60 tablet, Rfl: 1 .  buPROPion (WELLBUTRIN SR) 150 MG 12 hr tablet, Take 150 mg by mouth 2 (two) times daily., Disp: , Rfl:  .  clopidogrel (PLAVIX) 75 MG tablet, Take 1 tablet (75 mg total) by mouth daily., Disp: 30 tablet, Rfl: 1 .  gabapentin (NEURONTIN) 300 MG capsule, Take 300-600 mg by mouth See admin instructions. Take 300 mg by mouth four times daily, then take 600 mg by mouth at bedtime., Disp: , Rfl:  .  levothyroxine (SYNTHROID, LEVOTHROID) 200 MCG tablet, Take 200 mcg by mouth daily before breakfast. , Disp: , Rfl:  .  oxyCODONE-acetaminophen (PERCOCET/ROXICET) 5-325 MG tablet, Take 1-2 tablets by mouth every 8 (eight) hours as needed for severe pain., Disp: 8 tablet, Rfl: 0 .  sertraline (ZOLOFT) 100 MG tablet, Take 200 mg by mouth every morning., Disp: , Rfl:

## 2019-06-17 ENCOUNTER — Encounter: Payer: Self-pay | Admitting: *Deleted

## 2019-06-17 NOTE — Progress Notes (Signed)
June 17, 2019  [address] [place], [place] [address]  Reference: Manual Transmission Loop Recorder  Dear Mr Chinchilla,   I hope this letter finds you well. I am writing you to just remind you that it is time for you to complete a Audelia Hives transmission with your loop recorder.  Instructions for the transmission: Press gray button on monitor follow directions that will be provided on the window of monitor. Hold mouse reader over loop until hear a beep and see a check mark on monitor. Place mouse reader back on monitor for charging.  Please complete this transmission the day you receive this notification. Please call our office at [phone] for any questions you might have for the [facility] research study.  Thank you and we appreciate your participation in this very important clinical study trial.  Sincerely,    Philemon Kingdom 12  Research Coordinator

## 2019-06-26 NOTE — Telephone Encounter (Signed)
06/26/2019 Patient called back having trouble transmitting with machine Device saying battery low.  Ask patient to make sure device is plugged in.  Got patient to unplug and plug device back in.  It said it was charging.  Will have patient let it charge and try again in a hour or so.  If any more problems patient is suppose to call back and he will try and come see me tomorrow in clinic.

## 2019-07-01 ENCOUNTER — Telehealth: Payer: Self-pay | Admitting: *Deleted

## 2019-07-01 NOTE — Telephone Encounter (Signed)
Left message for patient to call office about transmission for Loop recorder for the STROKE AF research study

## 2019-07-10 ENCOUNTER — Ambulatory Visit (INDEPENDENT_AMBULATORY_CARE_PROVIDER_SITE_OTHER): Payer: Medicare Other | Admitting: *Deleted

## 2019-07-10 DIAGNOSIS — I6300 Cerebral infarction due to thrombosis of unspecified precerebral artery: Secondary | ICD-10-CM

## 2019-07-10 LAB — CUP PACEART REMOTE DEVICE CHECK
Date Time Interrogation Session: 20210311012803
Implantable Pulse Generator Implant Date: 20181214

## 2019-07-10 NOTE — Progress Notes (Signed)
ILR Remote 

## 2019-08-10 LAB — CUP PACEART REMOTE DEVICE CHECK
Date Time Interrogation Session: 20210411041315
Implantable Pulse Generator Implant Date: 20181214

## 2019-08-11 ENCOUNTER — Ambulatory Visit (INDEPENDENT_AMBULATORY_CARE_PROVIDER_SITE_OTHER): Payer: Medicare Other | Admitting: *Deleted

## 2019-08-11 DIAGNOSIS — I6300 Cerebral infarction due to thrombosis of unspecified precerebral artery: Secondary | ICD-10-CM

## 2019-08-11 NOTE — Progress Notes (Signed)
ILR Remote 

## 2019-09-03 ENCOUNTER — Telehealth: Payer: Self-pay

## 2019-09-03 NOTE — Telephone Encounter (Signed)
Left message for patient to inform of disconnected monitor. 

## 2019-09-11 LAB — CUP PACEART REMOTE DEVICE CHECK
Date Time Interrogation Session: 20210512041652
Implantable Pulse Generator Implant Date: 20181214

## 2019-09-15 ENCOUNTER — Ambulatory Visit (INDEPENDENT_AMBULATORY_CARE_PROVIDER_SITE_OTHER): Payer: Medicare PPO | Admitting: *Deleted

## 2019-09-15 DIAGNOSIS — I6300 Cerebral infarction due to thrombosis of unspecified precerebral artery: Secondary | ICD-10-CM

## 2019-09-16 NOTE — Progress Notes (Signed)
Carelink Summary Report / Loop Recorder 

## 2019-09-23 ENCOUNTER — Telehealth: Payer: Self-pay | Admitting: *Deleted

## 2019-09-23 NOTE — Telephone Encounter (Signed)
STROKE~AF research study: spoke with patient about month 30 loop interrogation. He has been in the process of moving and does not know where his monitor is located. I scheduled appointment for tomorrow.

## 2019-09-24 ENCOUNTER — Encounter: Payer: No Typology Code available for payment source | Admitting: *Deleted

## 2019-09-24 DIAGNOSIS — Z006 Encounter for examination for normal comparison and control in clinical research program: Secondary | ICD-10-CM

## 2019-09-24 NOTE — Research (Signed)
Pt here today for his Stroke AF 68 M visit.  Pt doing well, no complaints of cp or sob. No med changes.    Current Outpatient Medications:  .  amLODipine (NORVASC) 10 MG tablet, Take 10 mg by mouth daily. , Disp: , Rfl:  .  ARIPiprazole (ABILIFY) 5 MG tablet, Take 5 mg by mouth daily., Disp: , Rfl:  .  atorvastatin (LIPITOR) 40 MG tablet, Take 1 tablet (40 mg total) by mouth daily at 6 PM., Disp: 30 tablet, Rfl: 0 .  benazepril (LOTENSIN) 20 MG tablet, Take 1 tablet (20 mg total) by mouth 2 (two) times daily., Disp: 60 tablet, Rfl: 1 .  buPROPion (WELLBUTRIN SR) 150 MG 12 hr tablet, Take 150 mg by mouth 2 (two) times daily., Disp: , Rfl:  .  clopidogrel (PLAVIX) 75 MG tablet, Take 1 tablet (75 mg total) by mouth daily., Disp: 30 tablet, Rfl: 1 .  gabapentin (NEURONTIN) 300 MG capsule, Take 300-600 mg by mouth See admin instructions. Take 300 mg by mouth four times daily, then take 600 mg by mouth at bedtime., Disp: , Rfl:  .  levothyroxine (SYNTHROID, LEVOTHROID) 200 MCG tablet, Take 200 mcg by mouth daily before breakfast. , Disp: , Rfl:  .  oxyCODONE-acetaminophen (PERCOCET/ROXICET) 5-325 MG tablet, Take 1-2 tablets by mouth every 8 (eight) hours as needed for severe pain., Disp: 8 tablet, Rfl: 0 .  sertraline (ZOLOFT) 100 MG tablet, Take 200 mg by mouth every morning., Disp: , Rfl:

## 2020-02-02 ENCOUNTER — Emergency Department (HOSPITAL_COMMUNITY)
Admission: EM | Admit: 2020-02-02 | Discharge: 2020-02-03 | Disposition: A | Discharge status: Home / Self Care | Payer: No Typology Code available for payment source | Attending: Emergency Medicine | Admitting: Emergency Medicine

## 2020-02-02 ENCOUNTER — Other Ambulatory Visit: Payer: Self-pay

## 2020-02-02 DIAGNOSIS — E039 Hypothyroidism, unspecified: Secondary | ICD-10-CM | POA: Insufficient documentation

## 2020-02-02 DIAGNOSIS — S99912A Unspecified injury of left ankle, initial encounter: Secondary | ICD-10-CM | POA: Diagnosis present

## 2020-02-02 DIAGNOSIS — S82852A Displaced trimalleolar fracture of left lower leg, initial encounter for closed fracture: Secondary | ICD-10-CM | POA: Diagnosis not present

## 2020-02-02 DIAGNOSIS — I10 Essential (primary) hypertension: Secondary | ICD-10-CM | POA: Insufficient documentation

## 2020-02-02 DIAGNOSIS — W19XXXA Unspecified fall, initial encounter: Secondary | ICD-10-CM

## 2020-02-02 DIAGNOSIS — Z79899 Other long term (current) drug therapy: Secondary | ICD-10-CM | POA: Insufficient documentation

## 2020-02-02 DIAGNOSIS — S82851A Displaced trimalleolar fracture of right lower leg, initial encounter for closed fracture: Secondary | ICD-10-CM

## 2020-02-02 DIAGNOSIS — W010XXA Fall on same level from slipping, tripping and stumbling without subsequent striking against object, initial encounter: Secondary | ICD-10-CM | POA: Insufficient documentation

## 2020-02-02 DIAGNOSIS — Z87891 Personal history of nicotine dependence: Secondary | ICD-10-CM | POA: Insufficient documentation

## 2020-02-03 ENCOUNTER — Emergency Department (HOSPITAL_COMMUNITY): Payer: No Typology Code available for payment source

## 2020-02-03 ENCOUNTER — Encounter (HOSPITAL_COMMUNITY): Payer: Self-pay | Admitting: Emergency Medicine

## 2020-02-03 MED ORDER — HYDROCODONE-ACETAMINOPHEN 5-325 MG PO TABS: 1.0000 | ORAL_TABLET | Freq: Four times a day (QID) | ORAL | 0 refills | Status: DC | PRN

## 2020-02-03 MED ORDER — OXYCODONE-ACETAMINOPHEN 5-325 MG PO TABS
1.0000 | ORAL_TABLET | Freq: Once | ORAL | Status: AC
  Administered 2020-02-03: 1 via ORAL
  Filled 2020-02-03: qty 1

## 2020-02-03 NOTE — Discharge Instructions (Signed)
You can take Tylenol or Ibuprofen as directed for pain. You can alternate Tylenol and Ibuprofen every 4 hours. If you take Tylenol at 1pm, then you can take Ibuprofen at 5pm. Then you can take Tylenol again at 9pm.   Take pain medications as directed for break through pain. Do not drive or operate machinery while taking this medication.   Keep the leg elevated. Use ice over splint for 30 minutes 4 times per day.  Use crutches or walker so that you don't put weight on the leg. I have written you a prescription for a walker which you can pick up at any medical supply store.   As we discussed you will follow up with Dr. Stann Mainland. Please go to his office on Thursday but call to make an appointment first.   Return to the Emergency Dept for any worsening pain, worsening discoloration of feet, numbness/weakness or any other worsening or concerning symptoms.

## 2020-02-03 NOTE — ED Triage Notes (Signed)
Pt c/o R ankle pain after falling out of a car 1 hour ago.

## 2020-02-03 NOTE — ED Notes (Signed)
Ortho called for splint  

## 2020-02-03 NOTE — Progress Notes (Signed)
Orthopedic Tech Progress Note Patient Details:  Randy Shepherd 04-12-1951 [id number]  Ortho Devices Type of Ortho Device: Post (short) splint, Stirrup splint, Crutches Splint Material: Fiberglass Ortho Device/Splint Location: LLE Ortho Device/Splint Interventions: Application, Ordered   Post Interventions Patient Tolerated: Well Instructions Provided: Adjustment of device, Care of device   Randy Shepherd A Dexter Sauser 02/03/2020, 12:27 PM

## 2020-02-03 NOTE — ED Provider Notes (Signed)
[facility] EMERGENCY DEPARTMENT Provider Note   CSN: [id number] Arrival date & time: 02/02/20  2318     History Chief Complaint  Patient presents with  . Ankle Pain    Randy Shepherd is a 69 y.o. male with PMH/o DDD, HTN, hypothyroidism who presents for evaluation of right ankle pain and swelling after mechanical fall that occurred last night.  Patient reports that he was getting out of a car and states that he tripped and fell, causing him to twist his ankle.  He denies any head injury or LOC.  He states that since then, he has had pain and swelling noted to ankle and has difficulty ambulating.  He has not been able to put any weight on the right ankle.  Denies any numbness/weakness. He is on Plavix.  He denies any hip pain.  The history is provided by the patient.       Past Medical History:  Diagnosis Date  . Abscess 10/07/2012   RT KNEE  . Degenerative disc disease   . Depression   . DJD (degenerative joint disease)   . High cholesterol   . Hx of adenomatous colonic polyps 09/26/2017  . Hypertension   . Hypothyroidism   . Stroke [facility])     Patient Active Problem List   Diagnosis Date Noted  . 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 At high risk for falls 07/19/2018  . Hypokalemia 07/18/2018  . CVA (cerebral vascular accident) ([place]) 07/17/2018  . Hx of adenomatous colonic polyps 09/26/2017  . 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 Cellulitis and abscess of leg 10/06/2012  . Hypertension 10/06/2012  . Hyperlipidemia 10/06/2012  . History of stroke 10/06/2012  . Thyroid dysfunction 10/06/2012    Past Surgical History:  Procedure Laterality Date  . APPENDECTOMY    . COLONOSCOPY    . IR ANGIO INTRA EXTRACRAN SEL COM CAROTID INNOMINATE BILAT MOD SED  04/11/2017  . IR ANGIO VERTEBRAL SEL SUBCLAVIAN INNOMINATE UNI R MOD SED  04/11/2017  . IR ANGIO VERTEBRAL SEL VERTEBRAL UNI L MOD SED  04/11/2017  . IR RADIOLOGIST EVAL &  MGMT  05/23/2017  . LOOP RECORDER INSERTION N/A 04/13/2017   Procedure: LOOP RECORDER INSERTION;  Surgeon: Thompson Grayer, MD;  Location: [facility] CV LAB;  Service: Cardiovascular;  Laterality: N/A;  . lpop recorder    . RADIOLOGY WITH ANESTHESIA N/A 04/11/2017   Procedure: IR WITH ANESTHESIA;  Surgeon: Luanne Bras, MD;  Location: [facility];  Service: Radiology;  Laterality: N/A;       Family History  Problem Relation Age of Onset  . Cancer Mother        type unknown  . Heart disease Brother   . Throat cancer Brother   . Heart disease Brother     Social History   Tobacco Use  . Smoking status: Former Smoker    Quit date: 07/17/1987    Years since quitting: 32.5  . Smokeless tobacco: Never Used  Vaping Use  . Vaping Use: Never used  Substance Use Topics  . Alcohol use: Yes    Alcohol/week: 3.0 standard drinks    Types: 3 Cans of beer per week    Comment: DAILY BEER  . Drug use: No    Home Medications Prior to Admission medications   Medication Sig Start Date End Date Taking? Authorizing Provider  amLODipine (NORVASC) 10 MG tablet Take 10 mg by mouth daily.     [provider]  ARIPiprazole (ABILIFY) 5 MG tablet Take 5 mg by mouth daily.    [provider]  atorvastatin (LIPITOR) 40 MG tablet Take 1 tablet (40 mg total) by mouth daily at 6 PM. 07/19/18   Asencion Noble, MD  benazepril (LOTENSIN) 20 MG tablet Take 1 tablet (20 mg total) by mouth 2 (two) times daily. 04/13/17   [name], NP  buPROPion (WELLBUTRIN SR) 150 MG 12 hr tablet Take 150 mg by mouth 2 (two) times daily.    [provider]  clopidogrel (PLAVIX) 75 MG tablet Take 1 tablet (75 mg total) by mouth daily. 04/14/17   [name], NP  gabapentin (NEURONTIN) 300 MG capsule Take 300-600 mg by mouth See admin instructions. Take 300 mg by mouth four times daily, then take 600 mg by mouth at bedtime.    [provider]  HYDROcodone-acetaminophen  (NORCO/VICODIN) 5-325 MG tablet Take 1-2 tablets by mouth every 6 (six) hours as needed. 02/03/20   Volanda Napoleon, PA-C  levothyroxine (SYNTHROID, LEVOTHROID) 200 MCG tablet Take 200 mcg by mouth daily before breakfast.     [provider]  oxyCODONE-acetaminophen (PERCOCET/ROXICET) 5-325 MG tablet Take 1-2 tablets by mouth every 8 (eight) hours as needed for severe pain. 02/10/19   Volanda Napoleon, PA-C  sertraline (ZOLOFT) 100 MG tablet Take 200 mg by mouth every morning.    [provider]    Allergies    Patient has no known allergies.  Review of Systems   Review of Systems  Musculoskeletal:       Right ankle pain and swelling  Skin: Positive for color change.  Neurological: Negative for weakness.  All other systems reviewed and are negative.   Physical Exam Updated Vital Signs BP (!) 153/82 (BP Location: Right Arm)   Pulse 67   Temp 98.6 F (37 C) (Oral)   Resp 16   Ht 5\' 8"  (1.727 m)   Wt 95.3 kg   SpO2 97%   BMI 31.93 kg/m   Physical Exam Vitals and nursing note reviewed.  Constitutional:      Appearance: He is well-developed.  HENT:     Head: Normocephalic and atraumatic.  Cardiovascular:     Pulses:          Dorsalis pedis pulses are 2+ on the right side and 2+ on the left side.  Pulmonary:     Effort: Pulmonary effort is normal.  Musculoskeletal:     Cervical back: Normal range of motion.     Comments: Tenderness palpation noted diffusely to both the medial and lateral malleolus of the right ankle with overlying soft tissue swelling.  There is diffuse ecchymosis noted to the medial malleolus with an overlying area of blister.  No open wound.  Limited range of motion secondary to pain.  He can wiggle all 5 toes.  No bony tenderness noted to proximal tib-fib, knee, bilateral hips.  Note tenderness palpation of the left lower extremity.  Skin:    General: Skin is warm and dry.     Capillary Refill: Capillary refill takes less than 2  seconds.     Comments: The skin is intact to ankle/foot.  The foot is warm and well perfused with intact sensation  Neurological:     Comments: Sensation intact throughout all major nerve distributions of the feet         ED Results / Procedures / Treatments   Labs (all labs ordered are listed, but only abnormal results  are displayed) Labs Reviewed - No data to display  EKG None  Radiology DG Ankle Complete Right  Result Date: 02/03/2020 CLINICAL DATA:  Fall and injury EXAM: RIGHT ANKLE - COMPLETE 3+ VIEW COMPARISON:  None. FINDINGS: There is a comminuted mildly displaced posterior malleolar fracture. Obliquely oriented distal fibular fracture is also noted. There is widening of the medial clear space measuring up to 7 mm in transverse dimension. Overlying soft tissue swelling seen diffusely around the ankle. A small ankle joint effusion is seen. 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 Calcaneal enthesophytes are seen. IMPRESSION: Mildly displaced distal fibular and posterior malleolar fractures with widening of the medial clear space. Electronically Signed   By: Prudencio Pair M.D.   On: 02/03/2020 00:41    Procedures Procedures (including critical care time)  Medications Ordered in ED Medications  oxyCODONE-acetaminophen (PERCOCET/ROXICET) 5-325 MG per tablet 1 tablet (1 tablet Oral Given 02/03/20 7628)    ED Course  I have reviewed the triage vital signs and the nursing notes.  Pertinent labs & imaging results that were available during my care of the patient were reviewed by me and considered in my medical decision making (see chart for details).    MDM Rules/Calculators/A&P                          69 year old male who presents for evaluation of right ankle pain after mechanical fall that occurred last night.  He is on Plavix.  He states he did not hit his head or have any LOC.  He reports difficulty ambulating bearing weight since the incident.  On  initial arrival he is afebrile nontoxic-appearing.  Vital signs are stable.  He is neurovascularly intact.  Tenderness palpation of the right ankle with diffuse overlying soft tissue swelling and ecchymosis.  He has a blister noted medial malleolus.  No open skin wounds.  Concern for fracture versus sprain versus dislocation.  X-rays ordered at triage.  XR shows mildly displaced distal fibular and posterior malleolar fractures with widening of the medial clear space. There is a small ankle joint effusion noted.   Discussed patient with Hilbert Odor (Ortho PA) who will come evaluate the patient.   Discussed patient with PA Dellis Filbert after evaluation. Recommends sugar tong splint with posterior padding with crutches/walker for non-weightbearing. Plan to follow up with Stann Mainland in 2 days.   Re-evaluation after splint placement. Patient with good distal cap refill and sensation.  Patient will be given a short course of pain medication for acute breakthrough pain.  Patient instructed to follow-up with orthopedics as directed.  Patient also provided crutches. At this time, patient exhibits no emergent life-threatening condition that require further evaluation in ED. Patient had ample opportunity for questions and discussion. All patient's questions were answered with full understanding. Strict return precautions discussed. Patient expresses understanding and agreement to plan.   Portions of this note were generated with [employer]. Dictation errors may occur despite best attempts at proofreading.  Final Clinical Impression(s) / ED Diagnoses Final diagnoses:  Fall  Closed trimalleolar fracture of right ankle, initial encounter    Rx / DC Orders ED Discharge Orders         Ordered    HYDROcodone-acetaminophen (NORCO/VICODIN) 5-325 MG tablet  Every 6 hours PRN        02/03/20 1031    Walker standard        02/03/20 1146    Walker rolling  02/03/20 1147           Volanda Napoleon, PA-C 02/03/20 1206    Pattricia Boss, MD 02/04/20 1104

## 2020-02-03 NOTE — Consult Note (Signed)
Reason for Consult:Right ankle fx Referring Physician: D Ray  Randy Shepherd is an 69 y.o. male.  HPI: Randy Shepherd lost his balance and fell last night. He twisted his right ankle and had immediate severe pain and could not bear weight. He has balance issues since having a CVA earlier this year and ambulates with a cane. He was brought to the ED where x-rays showed an ankle fx and orthopedic surgery was consulted. He is retired.  Past Medical History:  Diagnosis Date  . Abscess 10/07/2012   RT KNEE  . Degenerative disc disease   . Depression   . DJD (degenerative joint disease)   . High cholesterol   . Hx of adenomatous colonic polyps 09/26/2017  . Hypertension   . Hypothyroidism   . Stroke [facility])     Past Surgical History:  Procedure Laterality Date  . APPENDECTOMY    . COLONOSCOPY    . IR ANGIO INTRA EXTRACRAN SEL COM CAROTID INNOMINATE BILAT MOD SED  04/11/2017  . IR ANGIO VERTEBRAL SEL SUBCLAVIAN INNOMINATE UNI R MOD SED  04/11/2017  . IR ANGIO VERTEBRAL SEL VERTEBRAL UNI L MOD SED  04/11/2017  . IR RADIOLOGIST EVAL & MGMT  05/23/2017  . LOOP RECORDER INSERTION N/A 04/13/2017   Procedure: LOOP RECORDER INSERTION;  Surgeon: Thompson Grayer, MD;  Location: [facility] CV LAB;  Service: Cardiovascular;  Laterality: N/A;  . lpop recorder    . RADIOLOGY WITH ANESTHESIA N/A 04/11/2017   Procedure: IR WITH ANESTHESIA;  Surgeon: Luanne Bras, MD;  Location: [facility];  Service: Radiology;  Laterality: N/A;    Family History  Problem Relation Age of Onset  . Cancer Mother        type unknown  . Heart disease Brother   . Throat cancer Brother   . Heart disease Brother     Social History:  reports that he quit smoking about 32 years ago. He has never used smokeless tobacco. He reports current alcohol use of about 3.0 standard drinks of alcohol per week. He reports that he does not use drugs.  Allergies: No Known Allergies  Medications: I have reviewed the patient's current  medications.  No results found for this or any previous visit (from the past 48 hour(s)).  DG Ankle Complete Right  Result Date: 02/03/2020 CLINICAL DATA:  Fall and injury EXAM: RIGHT ANKLE - COMPLETE 3+ VIEW COMPARISON:  None. FINDINGS: There is a comminuted mildly displaced posterior malleolar fracture. Obliquely oriented distal fibular fracture is also noted. There is widening of the medial clear space measuring up to 7 mm in transverse dimension. Overlying soft tissue swelling seen diffusely around the ankle. A small ankle joint effusion is seen. 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 Calcaneal enthesophytes are seen. IMPRESSION: Mildly displaced distal fibular and posterior malleolar fractures with widening of the medial clear space. Electronically Signed   By: Prudencio Pair M.D.   On: 02/03/2020 00:41    Review of Systems  HENT: Negative for ear discharge, ear pain, hearing loss and tinnitus.   Eyes: Negative for photophobia and pain.  Respiratory: Negative for cough and shortness of breath.   Cardiovascular: Negative for chest pain.  Gastrointestinal: Negative for abdominal pain, nausea and vomiting.  Genitourinary: Negative for dysuria, flank pain, frequency and urgency.  Musculoskeletal: Positive for arthralgias (Right ankle). Negative for back pain, myalgias and neck pain.  Neurological: Negative for dizziness and headaches.  Hematological: Does not bruise/bleed easily.  Psychiatric/Behavioral: The patient is not  nervous/anxious.    Blood pressure (!) 153/82, pulse 67, temperature 98.6 F (37 C), temperature source Oral, resp. rate 16, height 5\' 8"  (1.727 m), weight 95.3 kg, SpO2 97 %. Physical Exam Constitutional:      General: He is not in acute distress.    Appearance: He is well-developed. He is not diaphoretic.  HENT:     Head: Normocephalic and atraumatic.  Eyes:     General: No scleral icterus.       Right eye: No discharge.        Left eye: No  discharge.     Conjunctiva/sclera: Conjunctivae normal.  Cardiovascular:     Rate and Rhythm: Normal rate and regular rhythm.  Pulmonary:     Effort: Pulmonary effort is normal. No respiratory distress.  Musculoskeletal:     Cervical back: Normal range of motion.     Comments: RLE No traumatic wounds or rash, dramatic ecchymosis w/fx blisters medial ankle  Mod TTP  No knee effusion  Knee stable to varus/ valgus and anterior/posterior stress  Sens DPN, SPN, TN intact  Motor EHL 5/5  DP 2+, No significant edema  Skin:    General: Skin is warm and dry.  Neurological:     Mental Status: He is alert.  Psychiatric:        Behavior: Behavior normal.     Assessment/Plan: Right ankle fx -- Plan splint, NWB, and f/u with Dr. Stann Mainland on Thursday. He will need RW for ambulation. Will need ORIF once swelling subsides. Multiple medical problems including DDD, HTN, hypothyroidism, and s/p CVA on Plavix    Lisette Abu, PA-C Orthopedic Surgery [phone] 02/03/2020, 10:29 AM

## 2020-02-13 ENCOUNTER — Encounter (HOSPITAL_COMMUNITY): Payer: Self-pay | Admitting: Orthopedic Surgery

## 2020-02-13 ENCOUNTER — Other Ambulatory Visit: Payer: Self-pay

## 2020-02-13 ENCOUNTER — Encounter: Payer: Self-pay | Admitting: Internal Medicine

## 2020-02-13 NOTE — Progress Notes (Signed)
PCP - Goes to [place] in [place] - Denies  PPM/ICD - Loop recorder Device Orders -  Rep Notified -   Chest x-ray - 04/11/17 EKG - DOS Stress Test - Unknown ECHO - 07/17/18 Cardiac Cath -Denies   Sleep Study - No OSA  DM - Borderline Checks Blood Sugar __0___ times a day  Blood Thinner Instructions: Plavix instructed to stop on 02/13/20 Aspirin Instructions: Aspirin asked patient to call surgeon to get instruction  ERAS Protcol -Yes PRE-SURGERY Ensure unable to provide d/t surgery sheduled on Monday   COVID TEST- 02/14/20 1155  Anesthesia review: Yes cardiac history  Patient denies shortness of breath, fever, cough and chest pain.    All instructions explained to the patient, with a verbal understanding of the material.   Medications to take on morning of surgery : amLODipine (NORVASC) 10 MG tablet, ARIPiprazole (ABILIFY) 10 MG tablet, buPROPion (WELLBUTRIN SR) 150 MG 12 hr tablet, gabapentin (NEURONTIN) 300 MG capsule, levothyroxine (SYNTHROID, LEVOTHROID) 200 MCG tablet, sertraline (ZOLOFT) 100 MG tablet, oxyCODONE-acetaminophen (PERCOCET/ROXICET) 5-325 MG tablet if needed for pain  Patient also instructed to self quarantine after being tested for COVID-19. The opportunity to ask questions was provided.  Staff message sent to [facility] for device programming, [place] and [place]

## 2020-02-14 ENCOUNTER — Other Ambulatory Visit (HOSPITAL_COMMUNITY)
Admission: RE | Admit: 2020-02-14 | Discharge: 2020-02-14 | Disposition: A | Discharge status: Home / Self Care | Payer: Medicare PPO | Source: Ambulatory Visit | Attending: Orthopedic Surgery | Admitting: Orthopedic Surgery

## 2020-02-14 DIAGNOSIS — Z20822 Contact with and (suspected) exposure to covid-19: Secondary | ICD-10-CM | POA: Diagnosis not present

## 2020-02-14 DIAGNOSIS — Z01812 Encounter for preprocedural laboratory examination: Secondary | ICD-10-CM | POA: Diagnosis present

## 2020-02-15 LAB — SARS CORONAVIRUS 2 (TAT 6-24 HRS): SARS Coronavirus 2: NEGATIVE

## 2020-02-16 ENCOUNTER — Ambulatory Visit (HOSPITAL_COMMUNITY): Payer: Medicare PPO

## 2020-02-16 ENCOUNTER — Other Ambulatory Visit: Payer: Self-pay

## 2020-02-16 ENCOUNTER — Ambulatory Visit (HOSPITAL_COMMUNITY): Payer: Medicare PPO | Admitting: Certified Registered Nurse Anesthetist

## 2020-02-16 ENCOUNTER — Encounter (HOSPITAL_COMMUNITY): Payer: Self-pay | Admitting: Orthopedic Surgery

## 2020-02-16 ENCOUNTER — Encounter (HOSPITAL_COMMUNITY)
Admission: RE | Disposition: A | Discharge status: Home / Self Care | Payer: Self-pay | Source: Home / Self Care | Attending: Orthopedic Surgery

## 2020-02-16 ENCOUNTER — Observation Stay (HOSPITAL_COMMUNITY)
Admission: RE | Admit: 2020-02-16 | Discharge: 2020-02-21 | Disposition: A | Discharge status: Home / Self Care | Payer: Medicare PPO | Attending: Orthopedic Surgery | Admitting: Orthopedic Surgery

## 2020-02-16 DIAGNOSIS — X58XXXA Exposure to other specified factors, initial encounter: Secondary | ICD-10-CM | POA: Diagnosis not present

## 2020-02-16 DIAGNOSIS — R339 Retention of urine, unspecified: Secondary | ICD-10-CM | POA: Insufficient documentation

## 2020-02-16 DIAGNOSIS — Z7901 Long term (current) use of anticoagulants: Secondary | ICD-10-CM | POA: Diagnosis not present

## 2020-02-16 DIAGNOSIS — Z419 Encounter for procedure for purposes other than remedying health state, unspecified: Secondary | ICD-10-CM

## 2020-02-16 DIAGNOSIS — Z86010 Personal history of colonic polyps: Secondary | ICD-10-CM | POA: Insufficient documentation

## 2020-02-16 DIAGNOSIS — Z7982 Long term (current) use of aspirin: Secondary | ICD-10-CM | POA: Diagnosis not present

## 2020-02-16 DIAGNOSIS — S99911A Unspecified injury of right ankle, initial encounter: Secondary | ICD-10-CM | POA: Diagnosis present

## 2020-02-16 DIAGNOSIS — E039 Hypothyroidism, unspecified: Secondary | ICD-10-CM | POA: Diagnosis not present

## 2020-02-16 DIAGNOSIS — S82841A Displaced bimalleolar fracture of right lower leg, initial encounter for closed fracture: Principal | ICD-10-CM | POA: Insufficient documentation

## 2020-02-16 DIAGNOSIS — Z79899 Other long term (current) drug therapy: Secondary | ICD-10-CM | POA: Diagnosis not present

## 2020-02-16 DIAGNOSIS — I10 Essential (primary) hypertension: Secondary | ICD-10-CM | POA: Insufficient documentation

## 2020-02-16 HISTORY — PX: ORIF ANKLE FRACTURE: SHX5408

## 2020-02-16 LAB — CBC
HCT: 40.7 % (ref 39.0–52.0)
Hemoglobin: 13.2 g/dL (ref 13.0–17.0)
MCH: 29.9 pg (ref 26.0–34.0)
MCHC: 32.4 g/dL (ref 30.0–36.0)
MCV: 92.1 fL (ref 80.0–100.0)
Platelets: 317 10*3/uL (ref 150–400)
RBC: 4.42 MIL/uL (ref 4.22–5.81)
RDW: 14.3 % (ref 11.5–15.5)
WBC: 12.6 10*3/uL — ABNORMAL HIGH (ref 4.0–10.5)
nRBC: 0 % (ref 0.0–0.2)

## 2020-02-16 LAB — BASIC METABOLIC PANEL
Anion gap: 12 (ref 5–15)
BUN: 29 mg/dL — ABNORMAL HIGH (ref 8–23)
CO2: 22 mmol/L (ref 22–32)
Calcium: 9.3 mg/dL (ref 8.9–10.3)
Chloride: 106 mmol/L (ref 98–111)
Creatinine, Ser: 1.9 mg/dL — ABNORMAL HIGH (ref 0.61–1.24)
GFR, Estimated: 35 mL/min — ABNORMAL LOW (ref >60)
Glucose, Bld: 102 mg/dL — ABNORMAL HIGH (ref 70–99)
Potassium: 3.6 mmol/L (ref 3.5–5.1)
Sodium: 140 mmol/L (ref 135–145)

## 2020-02-16 LAB — SURGICAL PCR SCREEN
MRSA, PCR: NEGATIVE
Staphylococcus aureus: NEGATIVE

## 2020-02-16 SURGERY — OPEN REDUCTION INTERNAL FIXATION (ORIF) ANKLE FRACTURE
Anesthesia: General | Site: Ankle | Laterality: Right

## 2020-02-16 MED ORDER — BUPROPION HCL ER (SR) 150 MG PO TB12
150.0000 mg | ORAL_TABLET | Freq: Two times a day (BID) | ORAL | Status: DC
  Administered 2020-02-16 – 2020-02-21 (×10): 150 mg via ORAL
  Filled 2020-02-16 (×10): qty 1

## 2020-02-16 MED ORDER — LIDOCAINE 2% (20 MG/ML) 5 ML SYRINGE
INTRAMUSCULAR | Status: DC | PRN
  Administered 2020-02-16: 50 mg via INTRAVENOUS

## 2020-02-16 MED ORDER — MORPHINE SULFATE (PF) 2 MG/ML IV SOLN: 0.5000 mg | INTRAVENOUS | Status: DC | PRN

## 2020-02-16 MED ORDER — ONDANSETRON HCL 4 MG PO TABS: 4.0000 mg | ORAL_TABLET | Freq: Four times a day (QID) | ORAL | Status: DC | PRN

## 2020-02-16 MED ORDER — PHENYLEPHRINE 40 MCG/ML (10ML) SYRINGE FOR IV PUSH (FOR BLOOD PRESSURE SUPPORT)
PREFILLED_SYRINGE | INTRAVENOUS | Status: DC | PRN
  Administered 2020-02-16 (×5): 80 ug via INTRAVENOUS

## 2020-02-16 MED ORDER — ACETAMINOPHEN 325 MG PO TABS: 325.0000 mg | ORAL_TABLET | Freq: Four times a day (QID) | ORAL | Status: DC | PRN

## 2020-02-16 MED ORDER — TRANEXAMIC ACID-NACL 1000-0.7 MG/100ML-% IV SOLN
1000.0000 mg | INTRAVENOUS | Status: AC
  Administered 2020-02-16: 1000 mg via INTRAVENOUS
  Filled 2020-02-16: qty 100

## 2020-02-16 MED ORDER — CEFAZOLIN SODIUM-DEXTROSE 1-4 GM/50ML-% IV SOLN
1.0000 g | Freq: Four times a day (QID) | INTRAVENOUS | Status: AC
  Administered 2020-02-16 – 2020-02-17 (×3): 1 g via INTRAVENOUS
  Filled 2020-02-16 (×3): qty 50

## 2020-02-16 MED ORDER — PHENYLEPHRINE 40 MCG/ML (10ML) SYRINGE FOR IV PUSH (FOR BLOOD PRESSURE SUPPORT)
PREFILLED_SYRINGE | INTRAVENOUS | Status: AC
  Filled 2020-02-16: qty 10

## 2020-02-16 MED ORDER — PHENYLEPHRINE HCL-NACL 10-0.9 MG/250ML-% IV SOLN
INTRAVENOUS | Status: DC | PRN
  Administered 2020-02-16: 20 ug/min via INTRAVENOUS

## 2020-02-16 MED ORDER — BUPIVACAINE HCL (PF) 0.25 % IJ SOLN
INTRAMUSCULAR | Status: AC
  Filled 2020-02-16: qty 30

## 2020-02-16 MED ORDER — ONDANSETRON HCL 4 MG/2ML IJ SOLN
INTRAMUSCULAR | Status: AC
  Filled 2020-02-16: qty 2

## 2020-02-16 MED ORDER — OXYCODONE HCL 5 MG PO TABS: 5.0000 mg | ORAL_TABLET | Freq: Once | ORAL | Status: DC | PRN

## 2020-02-16 MED ORDER — AMISULPRIDE (ANTIEMETIC) 5 MG/2ML IV SOLN: 10.0000 mg | Freq: Once | INTRAVENOUS | Status: DC | PRN

## 2020-02-16 MED ORDER — ARIPIPRAZOLE 5 MG PO TABS
5.0000 mg | ORAL_TABLET | Freq: Every day | ORAL | Status: DC
  Administered 2020-02-17 – 2020-02-21 (×5): 5 mg via ORAL
  Filled 2020-02-16 (×5): qty 1

## 2020-02-16 MED ORDER — CEFAZOLIN SODIUM-DEXTROSE 2-4 GM/100ML-% IV SOLN
2.0000 g | INTRAVENOUS | Status: AC
  Administered 2020-02-16: 2 g via INTRAVENOUS
  Filled 2020-02-16: qty 100

## 2020-02-16 MED ORDER — FENTANYL CITRATE (PF) 250 MCG/5ML IJ SOLN
INTRAMUSCULAR | Status: DC | PRN
Start: 2020-02-16 — End: 2020-02-16
  Administered 2020-02-16: 50 ug via INTRAVENOUS

## 2020-02-16 MED ORDER — CHLORHEXIDINE GLUCONATE 0.12 % MT SOLN
15.0000 mL | Freq: Once | OROMUCOSAL | Status: AC
  Administered 2020-02-16: 15 mL via OROMUCOSAL
  Filled 2020-02-16: qty 15

## 2020-02-16 MED ORDER — METOCLOPRAMIDE HCL 5 MG/ML IJ SOLN: 5.0000 mg | Freq: Three times a day (TID) | INTRAMUSCULAR | Status: DC | PRN

## 2020-02-16 MED ORDER — MIDAZOLAM HCL 2 MG/2ML IJ SOLN
INTRAMUSCULAR | Status: AC
  Filled 2020-02-16: qty 2

## 2020-02-16 MED ORDER — CLOPIDOGREL BISULFATE 75 MG PO TABS
75.0000 mg | ORAL_TABLET | Freq: Every day | ORAL | Status: DC
  Administered 2020-02-16 – 2020-02-21 (×6): 75 mg via ORAL
  Filled 2020-02-16 (×6): qty 1

## 2020-02-16 MED ORDER — MIDAZOLAM HCL 5 MG/5ML IJ SOLN
INTRAMUSCULAR | Status: DC | PRN
  Administered 2020-02-16 (×2): 1 mg via INTRAVENOUS

## 2020-02-16 MED ORDER — ROPIVACAINE HCL 5 MG/ML IJ SOLN
INTRAMUSCULAR | Status: DC | PRN
  Administered 2020-02-16: 15 mL via PERINEURAL
  Administered 2020-02-16: 30 mL via PERINEURAL

## 2020-02-16 MED ORDER — LIDOCAINE 2% (20 MG/ML) 5 ML SYRINGE
INTRAMUSCULAR | Status: AC
  Filled 2020-02-16: qty 5

## 2020-02-16 MED ORDER — GABAPENTIN 300 MG PO CAPS
900.0000 mg | ORAL_CAPSULE | Freq: Every day | ORAL | Status: DC
  Administered 2020-02-17 – 2020-02-21 (×5): 900 mg via ORAL
  Filled 2020-02-16 (×5): qty 3

## 2020-02-16 MED ORDER — DEXAMETHASONE SODIUM PHOSPHATE 10 MG/ML IJ SOLN
INTRAMUSCULAR | Status: DC | PRN
  Administered 2020-02-16: 4 mg via INTRAVENOUS

## 2020-02-16 MED ORDER — DEXAMETHASONE SODIUM PHOSPHATE 10 MG/ML IJ SOLN
INTRAMUSCULAR | Status: DC | PRN
  Administered 2020-02-16: 5 mg

## 2020-02-16 MED ORDER — STERILE WATER FOR IRRIGATION IR SOLN
Status: DC | PRN
  Administered 2020-02-16: 1000 mL

## 2020-02-16 MED ORDER — FENTANYL CITRATE (PF) 100 MCG/2ML IJ SOLN: 25.0000 ug | INTRAMUSCULAR | Status: DC | PRN

## 2020-02-16 MED ORDER — ONDANSETRON HCL 4 MG/2ML IJ SOLN
INTRAMUSCULAR | Status: DC | PRN
  Administered 2020-02-16: 4 mg via INTRAVENOUS

## 2020-02-16 MED ORDER — HYDROCODONE-ACETAMINOPHEN 5-325 MG PO TABS
1.0000 | ORAL_TABLET | ORAL | Status: DC | PRN
  Administered 2020-02-17: 1 via ORAL
  Filled 2020-02-16: qty 1

## 2020-02-16 MED ORDER — METOCLOPRAMIDE HCL 5 MG PO TABS: 5.0000 mg | ORAL_TABLET | Freq: Three times a day (TID) | ORAL | Status: DC | PRN

## 2020-02-16 MED ORDER — TAMSULOSIN HCL 0.4 MG PO CAPS
0.4000 mg | ORAL_CAPSULE | Freq: Every day | ORAL | Status: DC
  Administered 2020-02-16 – 2020-02-21 (×6): 0.4 mg via ORAL
  Filled 2020-02-16 (×6): qty 1

## 2020-02-16 MED ORDER — ONDANSETRON HCL 4 MG/2ML IJ SOLN: 4.0000 mg | Freq: Four times a day (QID) | INTRAMUSCULAR | Status: DC | PRN

## 2020-02-16 MED ORDER — LACTATED RINGERS IV SOLN: INTRAVENOUS | Status: DC

## 2020-02-16 MED ORDER — DEXAMETHASONE SODIUM PHOSPHATE 10 MG/ML IJ SOLN
INTRAMUSCULAR | Status: AC
  Filled 2020-02-16: qty 1

## 2020-02-16 MED ORDER — HYDROCODONE-ACETAMINOPHEN 7.5-325 MG PO TABS
1.0000 | ORAL_TABLET | ORAL | Status: DC | PRN
  Administered 2020-02-17: 1 via ORAL
  Administered 2020-02-17 (×2): 2 via ORAL
  Administered 2020-02-18 – 2020-02-19 (×2): 1 via ORAL
  Filled 2020-02-16 (×2): qty 2
  Filled 2020-02-16 (×3): qty 1

## 2020-02-16 MED ORDER — 0.9 % SODIUM CHLORIDE (POUR BTL) OPTIME
TOPICAL | Status: DC | PRN
  Administered 2020-02-16: 1000 mL

## 2020-02-16 MED ORDER — PROPOFOL 10 MG/ML IV BOLUS
INTRAVENOUS | Status: DC | PRN
  Administered 2020-02-16: 160 mg via INTRAVENOUS

## 2020-02-16 MED ORDER — LEVOTHYROXINE SODIUM 200 MCG PO TABS
200.0000 ug | ORAL_TABLET | Freq: Every day | ORAL | Status: DC
  Administered 2020-02-17 – 2020-02-21 (×5): 200 ug via ORAL
  Filled 2020-02-16 (×2): qty 2
  Filled 2020-02-16: qty 1
  Filled 2020-02-16: qty 2
  Filled 2020-02-16 (×3): qty 1
  Filled 2020-02-16: qty 2
  Filled 2020-02-16 (×2): qty 1
  Filled 2020-02-16: qty 2

## 2020-02-16 MED ORDER — ACETAMINOPHEN 500 MG PO TABS
500.0000 mg | ORAL_TABLET | Freq: Four times a day (QID) | ORAL | Status: AC
  Administered 2020-02-16 – 2020-02-17 (×3): 500 mg via ORAL
  Filled 2020-02-16 (×3): qty 1

## 2020-02-16 MED ORDER — PROPOFOL 10 MG/ML IV BOLUS
INTRAVENOUS | Status: AC
  Filled 2020-02-16: qty 40

## 2020-02-16 MED ORDER — ONDANSETRON 4 MG PO TBDP: 4.0000 mg | ORAL_TABLET | Freq: Three times a day (TID) | ORAL | 0 refills | Status: DC | PRN

## 2020-02-16 MED ORDER — OXYCODONE HCL 5 MG/5ML PO SOLN: 5.0000 mg | Freq: Once | ORAL | Status: DC | PRN

## 2020-02-16 MED ORDER — DOCUSATE SODIUM 100 MG PO CAPS
100.0000 mg | ORAL_CAPSULE | Freq: Two times a day (BID) | ORAL | Status: DC
  Administered 2020-02-16 – 2020-02-21 (×10): 100 mg via ORAL
  Filled 2020-02-16 (×10): qty 1

## 2020-02-16 MED ORDER — ONDANSETRON HCL 4 MG/2ML IJ SOLN: 4.0000 mg | Freq: Once | INTRAMUSCULAR | Status: DC | PRN

## 2020-02-16 MED ORDER — ASPIRIN EC 81 MG PO TBEC
81.0000 mg | DELAYED_RELEASE_TABLET | Freq: Every day | ORAL | Status: DC
  Administered 2020-02-16 – 2020-02-21 (×6): 81 mg via ORAL
  Filled 2020-02-16 (×6): qty 1

## 2020-02-16 MED ORDER — AMLODIPINE BESYLATE 10 MG PO TABS
10.0000 mg | ORAL_TABLET | Freq: Every day | ORAL | Status: DC
  Administered 2020-02-17 – 2020-02-21 (×5): 10 mg via ORAL
  Filled 2020-02-16 (×5): qty 1

## 2020-02-16 MED ORDER — OXYCODONE HCL 5 MG PO TABS
5.0000 mg | ORAL_TABLET | ORAL | 0 refills | Status: DC | PRN
Start: 2020-02-16 — End: 2020-02-20

## 2020-02-16 MED ORDER — SERTRALINE HCL 100 MG PO TABS
200.0000 mg | ORAL_TABLET | Freq: Every day | ORAL | Status: DC
  Administered 2020-02-17 – 2020-02-21 (×5): 200 mg via ORAL
  Filled 2020-02-16 (×5): qty 2

## 2020-02-16 MED ORDER — ORAL CARE MOUTH RINSE: 15.0000 mL | Freq: Once | OROMUCOSAL | Status: AC

## 2020-02-16 MED ORDER — FENTANYL CITRATE (PF) 250 MCG/5ML IJ SOLN
INTRAMUSCULAR | Status: AC
  Filled 2020-02-16: qty 5

## 2020-02-16 SURGICAL SUPPLY — 62 items
BANDAGE ESMARK 6X9 LF (GAUZE/BANDAGES/DRESSINGS) IMPLANT
BIT DRILL 2 CANN GRADUATED (BIT) ×2 IMPLANT
BIT DRILL 2.5 CANN LNG (BIT) ×2 IMPLANT
BIT DRILL 2.5 CANN STRL (BIT) ×2 IMPLANT
BIT DRILL 2.7 (BIT) ×3
BIT DRILL 2.7X2.7/3XSCR ANKL (BIT) IMPLANT
BIT DRL 2.7X2.7/3XSCR ANKL (BIT) ×1
BNDG CMPR 9X6 STRL LF SNTH (GAUZE/BANDAGES/DRESSINGS)
BNDG COHESIVE 4X5 TAN STRL (GAUZE/BANDAGES/DRESSINGS) ×3 IMPLANT
BNDG ELASTIC 4X5.8 VLCR STR LF (GAUZE/BANDAGES/DRESSINGS) ×4 IMPLANT
BNDG ELASTIC 6X5.8 VLCR STR LF (GAUZE/BANDAGES/DRESSINGS) ×2 IMPLANT
BNDG ESMARK 6X9 LF (GAUZE/BANDAGES/DRESSINGS)
CUFF TOURN SGL QUICK 34 (TOURNIQUET CUFF)
CUFF TRNQT CYL 34X4.125X (TOURNIQUET CUFF) IMPLANT
DRAPE C-ARM 42X72 X-RAY (DRAPES) ×3 IMPLANT
DRAPE C-ARMOR (DRAPES) ×3 IMPLANT
DRAPE U-SHAPE 47X51 STRL (DRAPES) ×3 IMPLANT
DRSG ADAPTIC 3X8 NADH LF (GAUZE/BANDAGES/DRESSINGS) ×1 IMPLANT
DRSG PAD ABDOMINAL 8X10 ST (GAUZE/BANDAGES/DRESSINGS) ×3 IMPLANT
DURAPREP 26ML APPLICATOR (WOUND CARE) ×5 IMPLANT
ELECT REM PT RETURN 9FT ADLT (ELECTROSURGICAL) ×3
ELECTRODE REM PT RTRN 9FT ADLT (ELECTROSURGICAL) ×1 IMPLANT
GAUZE SPONGE 4X4 12PLY STRL (GAUZE/BANDAGES/DRESSINGS) ×1 IMPLANT
GLOVE BIO SURGEON STRL SZ7.5 (GLOVE) ×6 IMPLANT
GLOVE BIOGEL PI IND STRL 8 (GLOVE) ×2 IMPLANT
GLOVE BIOGEL PI INDICATOR 8 (GLOVE) ×4
GOWN STRL REUS W/ TWL LRG LVL3 (GOWN DISPOSABLE) ×2 IMPLANT
GOWN STRL REUS W/ TWL XL LVL3 (GOWN DISPOSABLE) ×2 IMPLANT
GOWN STRL REUS W/TWL LRG LVL3 (GOWN DISPOSABLE) ×6
GOWN STRL REUS W/TWL XL LVL3 (GOWN DISPOSABLE) ×6
IMPL TIGHTROP W/DRV K-LESS (Anchor) IMPLANT
IMPLANT TIGHTROPE W/DRV K-LESS (Anchor) ×6 IMPLANT
KIT BASIN OR (CUSTOM PROCEDURE TRAY) ×3 IMPLANT
KIT TURNOVER KIT B (KITS) ×3 IMPLANT
NS IRRIG 1000ML POUR BTL (IV SOLUTION) ×3 IMPLANT
PACK ORTHO EXTREMITY (CUSTOM PROCEDURE TRAY) ×3 IMPLANT
PAD ARMBOARD 7.5X6 YLW CONV (MISCELLANEOUS) ×6 IMPLANT
PAD CAST 4YDX4 CTTN HI CHSV (CAST SUPPLIES) ×2 IMPLANT
PADDING CAST COTTON 4X4 STRL (CAST SUPPLIES) ×6
PADDING CAST COTTON 6X4 STRL (CAST SUPPLIES) ×3 IMPLANT
PLATE 5HOLE LOCKING 91MML (Plate) ×2 IMPLANT
SCREW BN T10 FT 20X2.7XST CORT (Screw) IMPLANT
SCREW CANCELLOUS 3X16MM (Screw) ×2 IMPLANT
SCREW CORT 2.7X20 (Screw) ×3 IMPLANT
SCREW LOCK T10 FT 18X2.7X (Screw) IMPLANT
SCREW LOCKING 2.7X16MM (Screw) ×4 IMPLANT
SCREW LOCKING 2.7X18MM (Screw) ×6 IMPLANT
SCREW LOW PROFILE 3.5X16 (Screw) ×2 IMPLANT
SCREW NON-LOCKING 3.5X12MM (Screw) ×4 IMPLANT
SPONGE LAP 18X18 RF (DISPOSABLE) IMPLANT
SUCTION FRAZIER HANDLE 10FR (MISCELLANEOUS) ×3
SUCTION TUBE FRAZIER 10FR DISP (MISCELLANEOUS) ×1 IMPLANT
SUT ETHILON 3 0 PS 1 (SUTURE) ×5 IMPLANT
SUT VIC AB 0 CT1 27 (SUTURE) ×3
SUT VIC AB 0 CT1 27XBRD ANBCTR (SUTURE) IMPLANT
SUT VIC AB 2-0 CT1 27 (SUTURE) ×6
SUT VIC AB 2-0 CT1 TAPERPNT 27 (SUTURE) ×1 IMPLANT
SYR CONTROL 10ML LL (SYRINGE) IMPLANT
TOWEL GREEN STERILE (TOWEL DISPOSABLE) ×6 IMPLANT
TUBE CONNECTING 12'X1/4 (SUCTIONS) ×1
TUBE CONNECTING 12X1/4 (SUCTIONS) ×2 IMPLANT
YANKAUER SUCT BULB TIP NO VENT (SUCTIONS) ×3 IMPLANT

## 2020-02-16 NOTE — Transfer of Care (Signed)
Immediate Anesthesia Transfer of Care Note  Patient: Randy Shepherd  Procedure(s) Performed: OPEN REDUCTION INTERNAL FIXATION (ORIF) ANKLE FRACTURE (Right Ankle)  Patient Location: PACU  Anesthesia Type:GA combined with regional for post-op pain  Level of Consciousness: drowsy  Airway & Oxygen Therapy: Patient Spontanous Breathing and Patient connected to face mask oxygen  Post-op Assessment: Report given to RN and Post -op Vital signs reviewed and stable  Post vital signs: Reviewed and stable  Last Vitals:  Vitals Value Taken Time  BP 140/82 02/16/20 0929  Temp    Pulse 65 02/16/20 0932  Resp 10 02/16/20 0932  SpO2 100 % 02/16/20 0932  Vitals shown include unvalidated device data.  Last Pain:  Vitals:   02/16/20 0930  TempSrc:   PainSc: (P) Asleep      Patients Stated Pain Goal: 2 (19/47/12 5271)  Complications: No complications documented.

## 2020-02-16 NOTE — H&P (Signed)
H&P update  The surgical history has been reviewed and remains accurate without interval change.  The patient was re-examined and patient's physiologic condition has not changed significantly in the last 30 days. The condition still exists that makes this procedure necessary. The treatment plan remains the same, without new options for care.  No new pharmacological allergies or types of therapy has been initiated that would change the plan or the appropriateness of the plan.  The patient and/or family understand the potential benefits and risks.  Marea Reasner P. Stann Mainland, MD 02/16/2020 7:17 AM

## 2020-02-16 NOTE — Anesthesia Procedure Notes (Signed)
Procedure Name: LMA Insertion Date/Time: 02/16/2020 7:36 AM Performed by: Colin Benton, CRNA Pre-anesthesia Checklist: Patient identified, Emergency Drugs available, Suction available and Patient being monitored Patient Re-evaluated:Patient Re-evaluated prior to induction Oxygen Delivery Method: Circle system utilized Preoxygenation: Pre-oxygenation with 100% oxygen Induction Type: IV induction Ventilation: Mask ventilation without difficulty LMA: LMA inserted LMA Size: 4.0 Number of attempts: 1 Placement Confirmation: positive ETCO2 and breath sounds checked- equal and bilateral Tube secured with: Tape Dental Injury: Teeth and Oropharynx as per pre-operative assessment

## 2020-02-16 NOTE — Anesthesia Preprocedure Evaluation (Addendum)
Anesthesia Evaluation  Patient identified by MRN, date of birth, ID band Patient awake    Reviewed: Allergy & Precautions, NPO status , Patient's Chart, lab work & pertinent test results  History of Anesthesia Complications Negative for: history of anesthetic complications  Airway Mallampati: II  TM Distance: >3 FB Neck ROM: Full    Dental   Pulmonary neg pulmonary ROS, former smoker,    Pulmonary exam normal        Cardiovascular hypertension, Normal cardiovascular exam     Neuro/Psych Depression CVA (2018, 2020)    GI/Hepatic negative GI ROS, Neg liver ROS,   Endo/Other  Hypothyroidism   Renal/GU negative Renal ROS  negative genitourinary   Musculoskeletal  (+) Arthritis ,   Abdominal   Peds  Hematology ASA/Plavix   Anesthesia Other Findings  Echo 07/17/18: EF 60-65%, sclerosis w/o stenosis of AV  Reproductive/Obstetrics                           Anesthesia Physical Anesthesia Plan  ASA: III  Anesthesia Plan: General   Post-op Pain Management: GA combined w/ Regional for post-op pain   Induction: Intravenous  PONV Risk Score and Plan: 2 and Ondansetron, Dexamethasone, Midazolam and Treatment may vary due to age or medical condition  Airway Management Planned: LMA  Additional Equipment: None  Intra-op Plan:   Post-operative Plan: Extubation in OR  Informed Consent: I have reviewed the patients History and Physical, chart, labs and discussed the procedure including the risks, benefits and alternatives for the proposed anesthesia with the patient or authorized representative who has indicated his/her understanding and acceptance.     Dental advisory given  Plan Discussed with:   Anesthesia Plan Comments:        Anesthesia Quick Evaluation

## 2020-02-16 NOTE — Brief Op Note (Signed)
02/16/2020  8:56 AM  PATIENT:  Randy Shepherd  69 y.o. male  PRE-OPERATIVE DIAGNOSIS:  Right ankle distal fibula fracture 2. Disruption of distal tibiofibular joint, right  POST-OPERATIVE DIAGNOSIS:  1. Right ankle distal fibula fracture 2. Disruption of distal tibiofibular joint, right  PROCEDURE:  Procedure(s) with comments: OPEN REDUCTION INTERNAL FIXATION (ORIF) ANKLE FRACTURE (Right) - 90 mins  SURGEON:  Surgeon(s) and Role:    * Stann Mainland, Elly Modena, MD - Primary  PHYSICIAN ASSISTANT: Jonelle Sidle, PA-C.   ANESTHESIA:   regional and general  EBL:  20 cc  BLOOD ADMINISTERED:none  DRAINS: none   LOCAL MEDICATIONS USED:  NONE  SPECIMEN:  No Specimen  DISPOSITION OF SPECIMEN:  N/A  COUNTS:  YES  TOURNIQUET:  * Missing tourniquet times found for documented tourniquets in log: [id number] *  DICTATION: .Note written in EPIC  PLAN OF CARE: Discharge to home after PACU  PATIENT DISPOSITION:  PACU - hemodynamically stable.   Delay start of Pharmacological VTE agent (>24hrs) due to surgical blood loss or risk of bleeding: not applicable

## 2020-02-16 NOTE — Anesthesia Procedure Notes (Addendum)
Anesthesia Regional Block: Popliteal block   Pre-Anesthetic Checklist: ,, timeout performed, Correct Patient, Correct Site, Correct Laterality, Correct Procedure, Correct Position, site marked, Risks and benefits discussed,  Surgical consent,  Pre-op evaluation,  At surgeon's request and post-op pain management  Laterality: Right  Prep: chloraprep       Needles:  Injection technique: Single-shot  Needle Type: Echogenic Stimulator Needle     Needle Length: 10cm  Needle Gauge: 20     Additional Needles:   Procedures:,,,, ultrasound used (permanent image in chart),,,,  Narrative:  Start time: 02/16/2020 7:01 AM End time: 02/16/2020 7:03 AM  Performed by: Personally  Anesthesiologist: Lidia Collum, MD  Additional Notes: Standard monitors applied. Skin prepped. Good needle visualization with ultrasound. Injection made in 5cc increments with no resistance to injection. Patient tolerated the procedure well.

## 2020-02-16 NOTE — Op Note (Addendum)
Date of Surgery: 02/16/2020  INDICATIONS: [name] is a 69 y.o.-year-old male who sustained a right ankle fracture, bimalleolar equivalent; he was indicated for open reduction and internal fixation due to the displaced nature of the articular fracture and came to the operating room today for this procedure. The patient did consent to the procedure after discussion of the risks and benefits.  PREOPERATIVE DIAGNOSIS: 1. right distal fibula ankle fracture 2. Disruption of distal tibiofibular joint, right (syndesmosis).  POSTOPERATIVE DIAGNOSIS: Same.  PROCEDURE:  1. Open treatment of right ankle fracture with internal fixation. Lateral malleolar CPT [id number] 2.  Open treatment of right ankle distal tibiofibular joint (syndesmosis)   SURGEON: Syona Wroblewski P. Stann Mainland, M.D.  ASSIST: Jonelle Sidle, PA-C.  Attestation:  PA Thereasa Solo was utilized throughout the entire procedure.  Utilized for positioning the patient, approach to the distal fibula, implantation of deep implants as well as maintenance of the reduction.  Was also utilized for closure and application of splint..  ANESTHESIA:  general, and regional  TOURNIQUET TIME: < 60 min at 275 mmHg  IV FLUIDS AND URINE: See anesthesia.  ESTIMATED BLOOD LOSS: 20 mL.  IMPLANTS:  Arthrex distal fibular locking plate, stainless steel Arthrex tight rope suture device x2  COMPLICATIONS: None.  DESCRIPTION OF PROCEDURE: The patient was brought to the operating room and placed supine on the operating table.  The patient had been signed prior to the procedure and this was documented. The patient had the anesthesia placed by the anesthesiologist.  A nonsterile tourniquet was placed on the upper thigh.  The prep verification and incision time-outs were performed to confirm that this was the correct patient, site, side and location. The patient had an SCD on the opposite lower extremity. The patient did receive antibiotics prior to the incision and was  re-dosed during the procedure as needed at indicated intervals.  The patient had the lower extremity prepped and draped in the standard surgical fashion.  The extremity was exsanguinated using an esmarch bandage and the tourniquet was inflated to 275 mm Hg.   Of note, on the medial side there was significant fracture blister and desquamation of the superficial epidermis.  This was starting to granulate in with some immature healing skin just deep to that blistering.  Incision was made over the distal fibula and the fracture was exposed and reduced anatomically with a clamp. A lag screw was placed. I then applied a distal fibula locking plate and secured it proximally with nonlocking and distally with locking screws. Bone quality was fair. I used c-arm to confirm satisfactory reduction and fixation.    The syndesmosis was stressed using live fluoroscopy and found to be grossly un-stable.   Next, we moved to internal fixation with open reduction of the distal syndesmosis.  Placing a towel bump under the calf to allow the posterior aspect of the heel to float off of the operative table, we placed device was applied syndesmosis tight rope device.  There were both placed in a similar fashion.  We initially drilled from the lateral cortex of the fibula all the way across the medial cortex of the tibia.  Care was taken to remain parallel to the tibial plafond.  We then used the device to achieve reduction with sequential tightening of the #5 FiberWire suture.  A second device was placed due to the grossly unstable nature of the injury and the wide medial clear space.  Following placement of the second device the anatomic alignment was achieved.  The  wounds were irrigated, and closed with vicryl with routine closure for the skin. The wounds were injected with local anesthetic. Sterile gauze was applied followed by a posterior splint. He was awakened and returned to the PACU in stable and satisfactory condition.  There were no complications.   POSTOPERATIVE PLAN: [name] will remain nonweightbearing on this leg for approximately 8 weeks; [name] will return for suture removal in 2 weeks.  He will be immobilized in a short leg splint and then transitioned to a cast at his first follow up appointment.  [name] will receive DVT prophylaxis based on other medications, activity level, and risk ratio of bleeding to thrombosis.  Geralynn Rile, MD [facility] [place] [phone] 8:58 AM

## 2020-02-16 NOTE — Progress Notes (Signed)
Patient felt the urge to void, unable to, bladder scan showed 829, notified Dr. Stann Mainland, in and out cath ordered.  Rowe Pavy, RN

## 2020-02-16 NOTE — Discharge Instructions (Signed)
-  Maintain strict nonweightbearing to the right lower extremity.  Maintain your splint clean and dry at all times.  Do not submerge underwater.  -Patient elevate your right lower extremity with "toes above the nose."  -For mild to moderate pain take Tylenol and Advil around-the-clock.  For breakthrough pain use oxycodone as necessary.  -For the prevention of blood clots take an 81 mg aspirin once per day for the next 6 weeks.  You may resume your preoperative Plavix starting tomorrow.  - Return to see Dr. Stann Mainland in 2 weeks for routine postoperative care.

## 2020-02-16 NOTE — Anesthesia Postprocedure Evaluation (Signed)
Anesthesia Post Note  Patient: Randy Shepherd  Procedure(s) Performed: OPEN REDUCTION INTERNAL FIXATION (ORIF) ANKLE FRACTURE (Right Ankle)     Patient location during evaluation: PACU Anesthesia Type: General Level of consciousness: awake and alert Pain management: pain level controlled Vital Signs Assessment: post-procedure vital signs reviewed and stable Respiratory status: spontaneous breathing, nonlabored ventilation and respiratory function stable Cardiovascular status: blood pressure returned to baseline and stable Postop Assessment: no apparent nausea or vomiting Anesthetic complications: no   No complications documented.  Last Vitals:  Vitals:   02/16/20 1300 02/16/20 1315  BP: 129/75 128/66  Pulse: 73 73  Resp: 15 (!) 22  Temp:    SpO2: 95% 96%    Last Pain:  Vitals:   02/16/20 1300  TempSrc:   PainSc: [place]

## 2020-02-16 NOTE — Anesthesia Procedure Notes (Addendum)
Anesthesia Regional Block: Adductor canal block   Pre-Anesthetic Checklist: ,, timeout performed, Correct Patient, Correct Site, Correct Laterality, Correct Procedure, Correct Position, site marked, Risks and benefits discussed,  Surgical consent,  Pre-op evaluation,  At surgeon's request and post-op pain management  Laterality: Right  Prep: chloraprep       Needles:  Injection technique: Single-shot  Needle Type: Echogenic Stimulator Needle     Needle Length: 10cm  Needle Gauge: 20     Additional Needles:   Procedures:,,,, ultrasound used (permanent image in chart),,,,  Narrative:  Start time: 02/16/2020 6:58 AM End time: 02/16/2020 7:01 AM Injection made incrementally with aspirations every 5 mL.  Performed by: Personally  Anesthesiologist: Lidia Collum, MD  Additional Notes: Standard monitors applied. Skin prepped. Good needle visualization with ultrasound. Injection made in 5cc increments with no resistance to injection. Patient tolerated the procedure well.

## 2020-02-17 ENCOUNTER — Encounter (HOSPITAL_COMMUNITY): Payer: Self-pay | Admitting: Orthopedic Surgery

## 2020-02-17 DIAGNOSIS — S82841A Displaced bimalleolar fracture of right lower leg, initial encounter for closed fracture: Secondary | ICD-10-CM | POA: Diagnosis not present

## 2020-02-17 NOTE — Progress Notes (Signed)
Pt had a fall. Pt stated that his bed was wet and he tried to get out of the bed to the chair with the help of his wife. Fall bundle in place. Educated patient on the need to call for help when trying to get out of bed

## 2020-02-17 NOTE — Progress Notes (Signed)
Inpatient Rehab Admissions Coordinator Note:   Per therapy recommendations, pt was screened for CIR candidacy by Shann Medal, PT, DPT.  At this time pt does not have a diagnosis that would qualify him for CIR based on Medicare guidelines.  Recommend follow up with therapy in an alternate level of care.  Please contact me with questions.   Shann Medal, PT, DPT [phone] 02/17/20 3:47 PM

## 2020-02-17 NOTE — Evaluation (Signed)
Physical Therapy Evaluation Patient Details Name: Randy Shepherd MRN: [id number] DOB: [birth year] Today's Date: 02/17/2020   History of Present Illness  Pt is a 69 y.o. male who sustained R ankle fx 02/03/20 tx with splint/NWB and d/c home for swelling to subside, now admitted 02/16/20 and s/p R ankle ORIF. PMH includes stroke, HTN, DJD, depression.    Clinical Impression  Pt presents with an overall decrease in functional mobility secondary to above. Prior to initial ankle fx 02/03/20, pt was independent with mobility, living out of his car with wife; since ankle fx, has been using RW for limited mobility prior to return for sx yesterday. Educ on precautions, positioning, therex, and importance of mobility. Today, pt able to initiate transfer and gait training with RW and up to [place]. Pt limited by generalized weakness, decreased activity tolerance, pain and impaired balance strategies/postural reactions. Pt would benefit from intensive CIR-level services to maximize functional mobility and independence prior to d/c as pt needs to be able to ambulate community distances mod indep.    Follow Up Recommendations CIR;Supervision for mobility/OOB    Equipment Recommendations  Wheelchair (measurements PT);Wheelchair cushion (measurements PT)    Recommendations for Other Services       Precautions / Restrictions Precautions Precautions: Fall Restrictions Weight Bearing Restrictions: Yes RLE Weight Bearing: Non weight bearing      Mobility  Bed Mobility Overal bed mobility: Needs Assistance Bed Mobility: Supine to Sit     Supine to sit: Supervision;HOB elevated        Transfers Overall transfer level: Needs assistance Equipment used: Rolling walker (2 wheeled) Transfers: Sit to/from Stand Sit to Stand: Min assist         General transfer comment: Multiple stands from EOB and recliner to RW, cues for hand placement and RLE NWB precautions, pt requires consistent minA for  stability with elevating trunk and transfering RUE support to RW  Ambulation/Gait Ambulation/Gait assistance: Min assist;Mod assist Gait Distance (Feet): 6 Feet Assistive device: Rolling walker (2 wheeled)   Gait velocity: Decreased   General Gait Details: Hop-to gait pattern on LLE with RW and intermittent min-modA to maintain balance, cues for RLE NWB precautions; increasing difficulty taking complete hops on L foot with increasing fatigue; cues for safety with poor eccentric control and safety with sitting in recliner  Stairs            Wheelchair Mobility    Modified Rankin (Stroke Patients Only)       Balance Overall balance assessment: Needs assistance Sitting-balance support: No upper extremity supported;Feet supported;Feet unsupported Sitting balance-Leahy Scale: Good       Standing balance-Leahy Scale: Poor Standing balance comment: Reliant on UE support                             Pertinent Vitals/Pain Pain Assessment: 0-10 Pain Score: 8  Pain Location: RLE Pain Descriptors / Indicators: Guarding;Heaviness Pain Intervention(s): Limited activity within patient's tolerance;Repositioned;Patient requesting pain meds-RN notified    Home Living Family/patient expects to be discharged to:: Shelter/Homeless Living Arrangements: Spouse/significant other               Additional Comments: Lives in car with his wife    Prior Function Level of Independence: Needs assistance   Gait / Transfers Assistance Needed: Since ankle fx 10/5, pt was d/c with RW to be RLE NWB -- wife reports pt has difficulty with this and has been WB through RLE. Lives  in car, unable to elevate RLE (back seat full, cannot get foot onto dashboard)  ADL's / Homemaking Assistance Needed: Uses truck stops for bathing/bathroom.        Hand Dominance        Extremity/Trunk Assessment   Upper Extremity Assessment Upper Extremity Assessment: Generalized weakness     Lower Extremity Assessment Lower Extremity Assessment: Generalized weakness;RLE deficits/detail RLE Deficits / Details: s/p R ankle ORIF; hip and knee > 3/5 RLE: Unable to fully assess due to immobilization;Unable to fully assess due to pain       Communication      Cognition Arousal/Alertness: Awake/alert Behavior During Therapy: WFL for tasks assessed/performed Overall Cognitive Status: Impaired/Different from baseline Area of Impairment: Safety/judgement;Awareness;Problem solving                         Safety/Judgement: Decreased awareness of safety;Decreased awareness of deficits Awareness: Emergent Problem Solving: Requires verbal cues        General Comments General comments (skin integrity, edema, etc.): Wife present and supportive    Exercises Other Exercises Other Exercises: HEP Medbridge handout provided (Access Code [id number]) and performed - SLR, LAQ, seated march   Assessment/Plan    PT Assessment Patient needs continued PT services  PT Problem List Decreased strength;Decreased activity tolerance;Decreased balance;Decreased mobility;Decreased knowledge of use of DME;Decreased knowledge of precautions;Pain       PT Treatment Interventions DME instruction;Gait training;Stair training;Functional mobility training;Therapeutic activities;Therapeutic exercise;Balance training;Patient/family education;Wheelchair mobility training    PT Goals (Current goals can be found in the Care Plan section)  Acute Rehab PT Goals Patient Stated Goal: Interested in post-acute rehab services PT Goal Formulation: With patient/family Time For Goal Achievement: 03/02/20 Potential to Achieve Goals: Good    Frequency Min 5X/week   Barriers to discharge Inaccessible home environment      Co-evaluation               AM-PAC PT "6 Clicks" Mobility  Outcome Measure   Help needed moving from lying on your back to sitting on the side of a flat bed without using  bedrails?: None Help needed moving to and from a bed to a chair (including a wheelchair)?: A Little Help needed standing up from a chair using your arms (e.g., wheelchair or bedside chair)?: A Little Help needed to walk in hospital room?: A Lot Help needed climbing 3-5 steps with a railing? : A Lot 6 Click Score: 14    End of Session Equipment Utilized During Treatment: Gait belt Activity Tolerance: Patient tolerated treatment well;Patient limited by pain Patient left: in chair;with call bell/phone within reach;with chair alarm set Nurse Communication: Mobility status PT Visit Diagnosis: Other abnormalities of gait and mobility (R26.89);Muscle weakness (generalized) (M62.81);Pain Pain - Right/Left: Right Pain - part of body: Ankle and joints of foot    Time: 7510-2585 PT Time Calculation (min) (ACUTE ONLY): 30 min   Charges:   PT Evaluation $PT Eval Moderate Complexity: 1 Mod PT Treatments $Therapeutic Activity: 8-22 mins       Mabeline Caras, PT, DPT Acute Rehabilitation Services  Pager [phone] Office [place] 02/17/2020, 1:26 PM

## 2020-02-17 NOTE — Plan of Care (Signed)

## 2020-02-17 NOTE — Progress Notes (Signed)
° °  Subjective: Patient is a 69 year old male 1 day s/p ORIF of the right ankle with syndesmotic fixation.  Patient reports pain as moderate to severe.  He has worked with therapy. He denies numbness or tingling. He denies fever or chills.   Objective:   VITALS:   Vitals:   02/17/20 0031 02/17/20 0428 02/17/20 0838 02/17/20 1603  BP: 133/70 134/75 132/75 132/77  Pulse: 90 72 77 72  Resp: 17 18 17 17   Temp: 97.9 F (36.6 C) 98.4 F (36.9 C) 97.8 F (36.6 C) 97.8 F (36.6 C)  TempSrc: Oral Oral Oral Oral  SpO2: 96% 98% 98% 94%  Weight:      Height:       Right Lower Extremity:  Normal knee ROM.  Clean posterior splint, intact.  Patient resting in bed in no acute distress.  Sensation intact to all toes.  able to move all his toes.   MOTOR:  +motor EHL (great toe dorsiflexion) + FHL (great toe plantar flexion)    VASCULAR: Pulse present, capillary refill < 2   Lab Results  Component Value Date   WBC 12.6 (H) 02/16/2020   HGB 13.2 02/16/2020   HCT 40.7 02/16/2020   MCV 92.1 02/16/2020   PLT 317 02/16/2020   BMET    Component Value Date/Time   NA 140 02/16/2020 0607   K 3.6 02/16/2020 0607   CL 106 02/16/2020 0607   CO2 22 02/16/2020 0607   GLUCOSE 102 (H) 02/16/2020 0607   BUN 29 (H) 02/16/2020 0607   CREATININE 1.90 (H) 02/16/2020 0607   CALCIUM 9.3 02/16/2020 0607   GFRNONAA 35 (L) 02/16/2020 0607   GFRAA >60 07/19/2018 1001     Assessment/Plan: 1 Day Post-Op   Active Problems:   Bimalleolar fracture of right ankle, closed, initial encounter   Advance diet Up with therapy  (NWB to the right lower extremity)  Patient was evaluated for CIR placement but did not meet guidelines for placement per note.  Social Work Scientific laboratory technician for possible SNF placement vs. Other resources. Patient is currently living in his car with his wife.   Weightbearing Status: Nonweightbearing for 8 weeks to the right lower extremity DVT Prophylaxis: Asprin 81mg  Twice a Day for 8  weeks.  Return to clinic at [facility] at 2 weeks s/p surgery for suture removal and transition to cast.   Dutch Gray Emme Rosenau 02/17/2020, 5:18 PM  Jonelle Sidle PA-C  Physician Assistant with Dr. Rogers, [place]

## 2020-02-18 DIAGNOSIS — S82841A Displaced bimalleolar fracture of right lower leg, initial encounter for closed fracture: Secondary | ICD-10-CM | POA: Diagnosis not present

## 2020-02-18 MED ORDER — LIDOCAINE-EPINEPHRINE 1 %-1:100000 IJ SOLN
INTRAMUSCULAR | Status: AC
  Filled 2020-02-18: qty 1

## 2020-02-18 NOTE — Progress Notes (Signed)
Physical Therapy Treatment Patient Details Name: Randy Shepherd MRN: [id number] DOB: 1951-03-22 Today's Date: 02/18/2020    History of Present Illness Pt is a 69 y.o. male who sustained R ankle fx 02/03/20 tx with splint/NWB and d/c home for swelling to subside, now admitted 02/16/20 and s/p R ankle ORIF. PMH includes stroke, HTN, DJD, depression.   PT Comments    Pt with increased fatigue/lethargy this session (wife reports hasn't slept the past two nights). Session focused on education/demonstration for w/c management with community use and car storage; wife demonstrates good teach back, pt limited by fatigue; will continue to work on this, as well as transfer and gait training with RW. Continue to recommend SNF-level therapies to maximize functional mobility and independence.      Follow Up Recommendations  SNF;Supervision for mobility/OOB     Equipment Recommendations  Wheelchair (measurements PT);Wheelchair cushion (measurements PT)    Recommendations for Other Services       Precautions / Restrictions Precautions Precautions: Fall Restrictions Weight Bearing Restrictions: Yes RLE Weight Bearing: Non weight bearing    Mobility  Bed Mobility Overal bed mobility: Modified Independent Bed Mobility: Supine to Sit;Sit to Supine     Supine to sit: Supervision;HOB elevated Sit to supine: Supervision   General bed mobility comments: Repositioning in bed mod indep with HOB elevated and use of bed rail  Transfers Overall transfer level: Needs assistance Equipment used: Rolling walker (2 wheeled) Transfers: Sit to/from [employer] Sit to Stand: Min assist Stand pivot transfers: Min assist       General transfer comment:  (Deferred secondary to pt declining wanting to eat lunch; agreeable to w/c training/demonstration)  Ambulation/Gait                 Theme park manager mobility:  Yes Wheelchair Assistance Details (indicate cue type and reason): Only have bariatric-sized w/c available for training -- wife present for education as pt with increased fatigue/lethargy; educ on parts/components of w/c (brakes, arm rests removal, elevating leg rests) and safety, educ on car transfers; wife to have to be able to lift in/out of back seat for pt to use (reports she will be able to do this); pt demonstrates poor teach back, but wife demonstrating good teach back (reports experience with mother using w/c)  Modified Rankin (Stroke Patients Only)       Balance Overall balance assessment: Needs assistance Sitting-balance support: No upper extremity supported;Feet supported;Feet unsupported Sitting balance-Leahy Scale: Good     Standing balance support: Bilateral upper extremity supported;During functional activity Standing balance-Leahy Scale: Poor Standing balance comment: Reliant on UE support                            Cognition Arousal/Alertness: Lethargic Behavior During Therapy: Flat affect Overall Cognitive Status: Impaired/Different from baseline Area of Impairment: Safety/judgement;Awareness;Problem solving                         Safety/Judgement: Decreased awareness of safety;Decreased awareness of deficits Awareness: Emergent Problem Solving: Requires verbal cues General Comments: Falling asleep during initial conversation (wife reports he hasn't slept past two nights), becoming more alert once food arrived and able to participate in education      Exercises      General Comments General comments (skin integrity, edema, etc.): Wife present and engaged in session.  Discussed need for wheelchair for safety purposes and ease of mobility. Pt fatigued after RW use, denies SOB      Pertinent Vitals/Pain Pain Assessment: Faces Pain Score: 5  Faces Pain Scale: Hurts little more Pain Location: RLE Pain Descriptors / Indicators:  Guarding;Heaviness Pain Intervention(s): Monitored during session;Limited activity within patient's tolerance    Home Living Family/patient expects to be discharged to:: Shelter/Homeless Living Arrangements: Spouse/significant other             Additional Comments: Lives in car with his wife    Prior Function Level of Independence: Needs assistance  Gait / Transfers Assistance Needed: Since ankle fx 10/5, pt was d/c with RW to be RLE NWB -- wife reports pt has difficulty with this and has been WB through RLE. Lives in car, unable to elevate RLE (back seat full, cannot get foot onto dashboard) ADL's / Homemaking Assistance Needed: Pt reports after fx, wife assisting with LB dressing and bathing tasks. Pt typically uses truck stops for bathing/showering. Has been sponge bathing since fx     PT Goals (current goals can now be found in the care plan section) Acute Rehab PT Goals Patient Stated Goal: Hopeful for post-acute rehab services to regain strength PT Goal Formulation: With patient/family Time For Goal Achievement: 03/02/20 Potential to Achieve Goals: Good Progress towards PT goals: Progressing toward goals    Frequency    Min 5X/week      PT Plan Discharge plan needs to be updated    Co-evaluation              AM-PAC PT "6 Clicks" Mobility   Outcome Measure  Help needed turning from your back to your side while in a flat bed without using bedrails?: None Help needed moving from lying on your back to sitting on the side of a flat bed without using bedrails?: None Help needed moving to and from a bed to a chair (including a wheelchair)?: A Little Help needed standing up from a chair using your arms (e.g., wheelchair or bedside chair)?: A Little Help needed to walk in hospital room?: A Lot Help needed climbing 3-5 steps with a railing? : A Lot 6 Click Score: 18    End of Session   Activity Tolerance: Patient limited by fatigue;Patient limited by  lethargy Patient left: in bed;with call bell/phone within reach;with bed alarm set;with family/visitor present Nurse Communication: Mobility status PT Visit Diagnosis: Other abnormalities of gait and mobility (R26.89);Muscle weakness (generalized) (M62.81);Pain Pain - Right/Left: Right Pain - part of body: Ankle and joints of foot     Time: 7414-2395 PT Time Calculation (min) (ACUTE ONLY): 17 min  Charges:  $Wheel Chair Management: 8-22 mins                    Mabeline Caras, PT, DPT Acute Rehabilitation Services  Pager [phone] Office [place] 02/18/2020, 12:27 PM

## 2020-02-18 NOTE — Progress Notes (Signed)
Called to perform in and out catheter for failure to void after surgery. Patient updated as to plan and procedure, agrees to continue. Peri area cleansed and sterile gloves donned. Second nurse Kenna Gilbert present to ensure sterility throughout procedure. Patient tolerated procedure well and expressed feeling relief. Bladder emptied of 100cc of straw colored urine. Peri care performed at completion of procedure and patient resting supine in semi fowler's position. Bed is in the lowest and locked position with bed rails up times 3. Call bell within reach. Primary nurse made aware.

## 2020-02-18 NOTE — Progress Notes (Signed)
Patient non-compliant with safety measures, nurse entered patients room d/t bed alarm going off patient got out of bed to the bedside commonde with the help of his wife despite his bedside rails being raised.  Patient and wife stated that they called out just prior however patient did not wait for staff to assist to the restroom.   Patient re-educated on safety measures

## 2020-02-18 NOTE — Progress Notes (Signed)
   Subjective: Patient is a 69 year old male 2 days s/p ORIF of the right ankle with syndesmotic fixation.  Patient sleeping when I entered the room. He reports overall he is doing better and his pain has decreased. He denies numbness or tingling. He denies calf pain. He notes he had bladder discomfort that needed an in/out catheter which improved his symptoms. He has voided a small amount of urine since then.   No significant overnight events - patient and wife had to be re-educated on safety measures when going to bedside commode per notes.     Objective:   VITALS:   Vitals:   02/17/20 0838 02/17/20 1603 02/17/20 1941 02/18/20 0734  BP: 132/75 132/77 132/73 133/76  Pulse: 77 72 79 82  Resp: 17 17 18 17   Temp: 97.8 F (36.6 C) 97.8 F (36.6 C) 97.8 F (36.6 C) 98.1 F (36.7 C)  TempSrc: Oral Oral Oral Oral  SpO2: 98% 94% 96% 98%  Weight:      Height:       Right Lower Extremity:  Normal knee ROM. No pain with log roll. No calf pain Clean posterior splint, intact.  Patient resting in bed in no acute distress.  Sensation intact to all toes.  able to move all his toes.   MOTOR:  +motor EHL (great toe dorsiflexion) + FHL (great toe plantar flexion)    VASCULAR: Pulse present, capillary refill < 2   Lab Results  Component Value Date   WBC 12.6 (H) 02/16/2020   HGB 13.2 02/16/2020   HCT 40.7 02/16/2020   MCV 92.1 02/16/2020   PLT 317 02/16/2020   BMET    Component Value Date/Time   NA 140 02/16/2020 0607   K 3.6 02/16/2020 0607   CL 106 02/16/2020 0607   CO2 22 02/16/2020 0607   GLUCOSE 102 (H) 02/16/2020 0607   BUN 29 (H) 02/16/2020 0607   CREATININE 1.90 (H) 02/16/2020 0607   CALCIUM 9.3 02/16/2020 0607   GFRNONAA 35 (L) 02/16/2020 0607   GFRAA >60 07/19/2018 1001     Assessment/Plan: 2 Days Post-Op   Active Problems:   Bimalleolar fracture of right ankle, closed, initial encounter   Advance diet Up with therapy  (NWB to the right lower  extremity)  Patient was evaluated for CIR placement but did not meet guidelines for placement per note. Transition of Care team Consult for SNF placement placed on 02/17/2020. PT evaluated today recommending  SNF and wheelchair for ambulation due to fatigue with walker.   Spoke with patients nurse - patient required in/out cath and yielded 1000cc per patient's floor nurse (procedure note states 100cc) . He was started on flomax in the pacu.  We will continue to monitor this, may consider urology consult if needed.   Weightbearing Status: Nonweightbearing for 8 weeks to the right lower extremity  DVT Prophylaxis: Asprin 81mg  Twice a Day for 8 weeks.  Return to clinic at [facility] at 2 weeks s/p surgery for suture removal and transition to cast.   Faythe Casa 02/18/2020, 2:53 PM  Jonelle Sidle PA-C  Physician Assistant with Dr. Lillia Abed [place]
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
## 2020-02-18 NOTE — NC FL2 (Signed)
[place] LEVEL OF CARE SCREENING TOOL     IDENTIFICATION  Patient Name: Randy Shepherd Birthdate: 1950-06-17 Sex: male Admission Date (Current Location): 02/16/2020  [facility] and [place] Number:  Herbalist and Address:  [facility] [place], [place] [address], [place],  [address]      Provider Number: [id number]  Attending Physician Name and Address:  Nicholes Stairs, MD  Relative Name and Phone Number:       Current Level of Care: Hospital Recommended Level of Care: [place] Prior Approval Number:    Date Approved/Denied:   PASRR Number: Pending  Discharge Plan: SNF    Current Diagnoses: Patient Active Problem List   Diagnosis Date Noted  . Bimalleolar fracture of right ankle, closed, initial encounter 02/16/2020  . 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 At high risk for falls 07/19/2018  . Hypokalemia 07/18/2018  . CVA (cerebral vascular accident) ([place]) 07/17/2018  . Hx of adenomatous colonic polyps 09/26/2017  . 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 Stroke (cerebrum) ([place]) 04/11/2017  . Cellulitis and abscess of leg 10/06/2012  . Hypertension 10/06/2012  . Hyperlipidemia 10/06/2012  . History of stroke 10/06/2012  . Thyroid dysfunction 10/06/2012    Orientation RESPIRATION BLADDER Height & Weight     Self, Time, Situation, Place  Normal Continent Weight: 210 lb (95.3 kg) Height:  5\' 8"  (172.7 cm)  BEHAVIORAL SYMPTOMS/MOOD NEUROLOGICAL BOWEL NUTRITION STATUS      Continent Diet (See discharge summary)  AMBULATORY STATUS COMMUNICATION OF NEEDS Skin   Extensive Assist Verbally Normal                       Personal Care Assistance Level of Assistance  Bathing, Feeding, Dressing Bathing Assistance: Maximum assistance         Functional Limitations Info  Speech, Hearing, Sight Sight Info: Adequate Hearing Info: Adequate Speech Info: Adequate    SPECIAL CARE FACTORS FREQUENCY  OT (By  licensed OT), PT (By licensed PT)     PT Frequency: 5x a week OT Frequency: 5x a week            Contractures Contractures Info: Not present    Additional Factors Info  Code Status, Allergies, Psychotropic Code Status Info: Full Allergies Info: NKA Psychotropic Info: Zoloft, wellbutrin         Current Medications (02/18/2020):  This is the current hospital active medication list Current Facility-Administered Medications  Medication Dose Route Frequency Provider Last Rate Last Admin  . acetaminophen (TYLENOL) tablet 325-650 mg  325-650 mg Oral Q6H PRN Nicholes Stairs, MD      . amLODipine [facility]) tablet 10 mg  10 mg Oral Daily Nicholes Stairs, MD   10 mg at 02/18/20 0801  . ARIPiprazole (ABILIFY) tablet 5 mg  5 mg Oral Daily Nicholes Stairs, MD   5 mg at 02/18/20 0800  . aspirin EC tablet 81 mg  81 mg Oral Daily Nicholes Stairs, MD   81 mg at 02/18/20 0800  . buPROPion [facility]) 12 hr tablet 150 mg  150 mg Oral BID Nicholes Stairs, MD   150 mg at 02/18/20 0801  . clopidogrel (PLAVIX) tablet 75 mg  75 mg Oral Daily Nicholes Stairs, MD   75 mg at 02/18/20 0801  . docusate sodium (COLACE) capsule 100 mg  100 mg Oral BID Nicholes Stairs, MD   100 mg at 02/18/20 0801  . gabapentin (  NEURONTIN) capsule 900 mg  900 mg Oral Daily Nicholes Stairs, MD   900 mg at 02/18/20 0800  . HYDROcodone-acetaminophen (NORCO) 7.5-325 MG per tablet 1-2 tablet  1-2 tablet Oral Q4H PRN Nicholes Stairs, MD   1 tablet at 02/18/20 0801  . HYDROcodone-acetaminophen (NORCO/VICODIN) 5-325 MG per tablet 1-2 tablet  1-2 tablet Oral Q4H PRN Nicholes Stairs, MD   1 tablet at 02/17/20 1036  . levothyroxine (SYNTHROID) tablet 200 mcg  200 mcg Oral QAC breakfast Nicholes Stairs, MD   200 mcg at 02/18/20 8338  . metoCLOPramide (REGLAN) tablet 5-10 mg  5-10 mg Oral Q8H PRN Nicholes Stairs, MD       Or  . metoCLOPramide [facility]) injection 5-10  mg  5-10 mg Intravenous Q8H PRN Nicholes Stairs, MD      . morphine 2 MG/ML injection 0.5-1 mg  0.5-1 mg Intravenous Q2H PRN Nicholes Stairs, MD      . ondansetron [facility]) tablet 4 mg  4 mg Oral Q6H PRN Nicholes Stairs, MD       Or  . ondansetron [facility]) injection 4 mg  4 mg Intravenous Q6H PRN Nicholes Stairs, MD      . sertraline (ZOLOFT) tablet 200 mg  200 mg Oral Daily Nicholes Stairs, MD   200 mg at 02/18/20 0800  . tamsulosin (FLOMAX) capsule 0.4 mg  0.4 mg Oral Daily Nicholes Stairs, MD   0.4 mg at 02/18/20 0800     Discharge Medications: Please see discharge summary for a list of discharge medications.  Relevant Imaging Results:  Relevant Lab Results:   Additional Information SSN: [id number]  Emeterio Reeve, [place]

## 2020-02-18 NOTE — Plan of Care (Signed)
  Problem: Education: Goal: Knowledge of General Education information will improve Description: Including pain rating scale, medication(s)/side effects and non-pharmacologic comfort measures Outcome: Progressing   Problem: Health Behavior/Discharge Planning: Goal: Ability to manage health-related needs will improve Outcome: Progressing   Problem: Activity: Goal: Risk for activity intolerance will decrease Outcome: Progressing   Problem: Coping: Goal: Level of anxiety will decrease Outcome: Progressing   Problem: Elimination: Goal: Will not experience complications related to bowel motility Outcome: Progressing   Problem: Pain Managment: Goal: General experience of comfort will improve Outcome: Progressing   Problem: Safety: Goal: Ability to remain free from injury will improve Outcome: Progressing   

## 2020-02-18 NOTE — Plan of Care (Signed)
  Problem: Education: Goal: Knowledge of General Education information will improve Description Including pain rating scale, medication(s)/side effects and non-pharmacologic comfort measures Outcome: Progressing   Problem: Health Behavior/Discharge Planning: Goal: Ability to manage health-related needs will improve Outcome: Progressing   
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
## 2020-02-18 NOTE — Social Work (Signed)
To Whom It May Concern:  Please be advised that the above-named patient will require a short-term nursing home stay - anticipated 30 days or less for rehabilitation and strengthening.  The plan is for return home.  

## 2020-02-19 DIAGNOSIS — S82841A Displaced bimalleolar fracture of right lower leg, initial encounter for closed fracture: Secondary | ICD-10-CM | POA: Diagnosis not present

## 2020-02-19 MED ORDER — CHLORHEXIDINE GLUCONATE CLOTH 2 % EX PADS
6.0000 | MEDICATED_PAD | Freq: Every day | CUTANEOUS | Status: DC
  Administered 2020-02-19 – 2020-02-21 (×2): 6 via TOPICAL

## 2020-02-19 MED FILL — Lidocaine Inj 1% w/ Epinephrine-1:100000: INTRAMUSCULAR | Qty: 30 | Status: AC

## 2020-02-19 NOTE — Progress Notes (Signed)
Physical Therapy Treatment Patient Details Name: Randy Shepherd MRN: [id number] DOB: 1950-11-22 Today's Date: 02/19/2020    History of Present Illness Pt is a 69 y.o. male who sustained R ankle fx 02/03/20 tx with splint/NWB and d/c home for swelling to subside, now admitted 02/16/20 and s/p R ankle ORIF. PMH includes stroke, HTN, DJD, depression.   PT Comments    Pt progressing with mobility. Today's session focused on transfer training and RLE therex. Pt remains limited by generalized weakness, decreased activity tolerance, impaired balance and poor safety awareness; at high risk for falls. Continue to recommend SNF-level therapies to maximize functional mobility and independence.    Follow Up Recommendations  SNF;Supervision for mobility/OOB     Equipment Recommendations  Wheelchair (measurements PT);Wheelchair cushion (measurements PT)    Recommendations for Other Services       Precautions / Restrictions Precautions Precautions: Fall Restrictions Weight Bearing Restrictions: Yes RLE Weight Bearing: Non weight bearing    Mobility  Bed Mobility Overal bed mobility: Modified Independent Bed Mobility: Supine to Sit              Transfers Overall transfer level: Needs assistance Equipment used: Rolling walker (2 wheeled) Transfers: Sit to/from Stand Sit to Stand: Min assist         General transfer comment: Increased time and effort powering into standing from EOB, requiring minA; performed 6x sit<>stands from recliner, requiring consistent minA for stability (especially with transfer of BUE support to RW); frequent cues for safe technique and RLE NWB precautions  Ambulation/Gait Ambulation/Gait assistance: Mod assist Gait Distance (Feet): 2 Feet Assistive device: Rolling walker (2 wheeled)   Gait velocity: Decreased   General Gait Details: Hop-to gait pattern on LLE with RW and intermittent modA to maintain balance, cues for RLE NWB precautions;  increasing difficulty taking complete hops on L foot with increasing fatigue; pt distracted by urine incontinence while standing requiring max cues for safety as he tried to hop over it, frequently takign UE support off RW and moving to bed rail/arm rest   Stairs             Wheelchair Mobility    Modified Rankin (Stroke Patients Only)       Balance Overall balance assessment: Needs assistance Sitting-balance support: No upper extremity supported;Feet supported;Feet unsupported Sitting balance-Leahy Scale: Good     Standing balance support: Bilateral upper extremity supported;During functional activity Standing balance-Leahy Scale: Poor Standing balance comment: Reliant on UE support                            Cognition Arousal/Alertness: Awake/alert Behavior During Therapy: Flat affect Overall Cognitive Status: Impaired/Different from baseline Area of Impairment: Safety/judgement;Awareness;Problem solving;Following commands                       Following Commands: Follows multi-step commands inconsistently Safety/Judgement: Decreased awareness of safety;Decreased awareness of deficits Awareness: Emergent Problem Solving: Requires verbal cues;Difficulty sequencing        Exercises Other Exercises Other Exercises: RLE LAQ w/ 10-sec hold, hamstring stretch (with BLEs elevated in recliner, leaning forward with UE support), standing RLE high march, standing RLE hip flex (leg straight)    General Comments General comments (skin integrity, edema, etc.): Wife present and supportive. Pt able to void 150 mL urine during session      Pertinent Vitals/Pain Pain Assessment: No/denies pain Pain Intervention(s): Monitored during session    Home Living  Prior Function            PT Goals (current goals can now be found in the care plan section) Progress towards PT goals: Progressing toward goals    Frequency    Min  5X/week      PT Plan Current plan remains appropriate    Co-evaluation              AM-PAC PT "6 Clicks" Mobility   Outcome Measure  Help needed turning from your back to your side while in a flat bed without using bedrails?: None Help needed moving from lying on your back to sitting on the side of a flat bed without using bedrails?: None Help needed moving to and from a bed to a chair (including a wheelchair)?: A Lot Help needed standing up from a chair using your arms (e.g., wheelchair or bedside chair)?: A Little Help needed to walk in hospital room?: A Lot Help needed climbing 3-5 steps with a railing? : A Lot 6 Click Score: 17    End of Session Equipment Utilized During Treatment: Gait belt Activity Tolerance: Patient tolerated treatment well Patient left: in chair;with call bell/phone within reach;with chair alarm set;with family/visitor present Nurse Communication: Mobility status PT Visit Diagnosis: Other abnormalities of gait and mobility (R26.89);Muscle weakness (generalized) (M62.81);Pain Pain - Right/Left: Right Pain - part of body: Ankle and joints of foot     Time: 1337-1406 PT Time Calculation (min) (ACUTE ONLY): 29 min  Charges:  $Therapeutic Exercise: 8-22 mins $Therapeutic Activity: 8-22 mins                    Mabeline Caras, PT, DPT Acute Rehabilitation Services  Pager [phone] Office [place] 02/19/2020, 5:28 PM

## 2020-02-19 NOTE — Progress Notes (Signed)
   Subjective: Patient is a 69 year old male 3 days s/p ORIF of the right ankle with syndesmotic fixation.  Patient reports minimal to no pain at this time.  He denies numbness or tingling. He denies calf pain. He notes again had bladder distention and discomfort - the nurse attempted a straight catheter which yielded a small amount of fluid and blood.      Objective:   VITALS:   Vitals:   02/18/20 0734 02/18/20 1840 02/19/20 0209 02/19/20 0736  BP: 133/76 (!) 148/87 (!) 146/84 138/82  Pulse: 82 92 90 92  Resp: 17 18 16 17   Temp: 98.1 F (36.7 C) 98.2 F (36.8 C) 97.8 F (36.6 C) 98.4 F (36.9 C)  TempSrc: Oral  Oral Oral  SpO2: 98% 97% 96% 99%  Weight:      Height:       Right Lower Extremity:  Normal knee ROM. No pain with log roll. No calf pain. Slight improvement in toe swelling Clean posterior splint, intact.  Patient resting in bed in no acute distress.  Sensation intact to all toes.  able to move all his toes.   MOTOR:  +motor EHL (great toe dorsiflexion) + FHL (great toe plantar flexion)    VASCULAR:  capillary refill < 2   Lab Results  Component Value Date   WBC 12.6 (H) 02/16/2020   HGB 13.2 02/16/2020   HCT 40.7 02/16/2020   MCV 92.1 02/16/2020   PLT 317 02/16/2020   BMET    Component Value Date/Time   NA 140 02/16/2020 0607   K 3.6 02/16/2020 0607   CL 106 02/16/2020 0607   CO2 22 02/16/2020 0607   GLUCOSE 102 (H) 02/16/2020 0607   BUN 29 (H) 02/16/2020 0607   CREATININE 1.90 (H) 02/16/2020 0607   CALCIUM 9.3 02/16/2020 0607   GFRNONAA 35 (L) 02/16/2020 0607   GFRAA >60 07/19/2018 1001     Assessment/Plan: 3 Days Post-Op   Active Problems:   Bimalleolar fracture of right ankle, closed, initial encounter   Advance diet Up with therapy  (NWB to the right lower extremity)  Patient was evaluated for CIR placement but did not meet guidelines for placement per note PT evaluated today recommending  SNF and wheelchair for ambulation due  to fatigue with walker. Transition of care team saw the patient and recommended SNF placement, NCFL2 placed. D/C will be based on SNF placement/ urology recommendations.   Spoke with patients nurse - patient again had urinary distention. A in/out catheter was attempted yielding minimal urine and blood noted at the meatus.  He was started on flomax in the pacu.  Recommend catheter placement with possible outpatient urology follow up. Page sent to urology for recommendations - awaiting call back, no formal consult sent.   Weightbearing Status: Nonweightbearing for 8 weeks to the right lower extremity  DVT Prophylaxis: Asprin 81mg  Twice a Day for 8 weeks.  Return to clinic at [facility] at 2 weeks s/p surgery for suture removal and transition to cast.   Dutch Gray Delores Edelstein 02/19/2020, 11:00 AM  Jonelle Sidle PA-C  Physician Assistant with Dr. Rogers, [place]

## 2020-02-19 NOTE — Progress Notes (Signed)
Per order Foley catheter placed on patient with 1069ml urine output

## 2020-02-19 NOTE — Care Management Obs Status (Signed)
MEDICARE OBSERVATION STATUS NOTIFICATION   Patient Details  Name: Randy Shepherd MRN: [id number] Date of Birth: 08-Jul-1950   Medicare Observation Status Notification Given:  Yes    Sharin Mons, RN 02/19/2020, 3:20 PM

## 2020-02-19 NOTE — Care Management CC44 (Signed)
Condition Code 44 Documentation Completed  Patient Details  Name: Randy Shepherd MRN: [id number] Date of Birth: [birth year]   Condition Code 44 given:  Yes Patient signature on Condition Code 44 notice:  Yes Documentation of 2 MD's agreement:  Yes Code 44 added to claim:  Yes    Sharin Mons, RN 02/19/2020, 3:20 PM

## 2020-02-19 NOTE — Plan of Care (Signed)
  Problem: Education: Goal: Knowledge of General Education information will improve Description: Including pain rating scale, medication(s)/side effects and non-pharmacologic comfort measures Outcome: Progressing   Problem: Health Behavior/Discharge Planning: Goal: Ability to manage health-related needs will improve Outcome: Progressing   Problem: Activity: Goal: Risk for activity intolerance will decrease Outcome: Progressing   Problem: Elimination: Goal: Will not experience complications related to bowel motility Outcome: Progressing Goal: Will not experience complications related to urinary retention Outcome: Progressing   Problem: Safety: Goal: Ability to remain free from injury will improve Outcome: Progressing   Problem: Skin Integrity: Goal: Risk for impaired skin integrity will decrease Outcome: Progressing   

## 2020-02-20 DIAGNOSIS — S82841A Displaced bimalleolar fracture of right lower leg, initial encounter for closed fracture: Secondary | ICD-10-CM | POA: Diagnosis not present

## 2020-02-20 MED ORDER — OXYCODONE HCL 5 MG PO TABS
5.0000 mg | ORAL_TABLET | ORAL | 0 refills | Status: DC | PRN
Start: 2020-02-20 — End: 2020-04-04

## 2020-02-20 NOTE — Progress Notes (Signed)
PT Cancellation Note  Patient Details Name: Randy Shepherd MRN: [id number] DOB: [birth year]   Cancelled Treatment:    Reason Eval/Treat Not Completed: (P) Other (comment) Pt sleeping (10AM) and not easily awoken. Upon re-entry, pt working with OT (1356 PM).  Kara Pacer Myiesha Edgar 02/20/2020, 4:22 PM

## 2020-02-20 NOTE — TOC Progression Note (Addendum)
Transition of Care [facility]) - Progression Note    Patient Details  Name: ELEUTERIO DOLLAR MRN: [id number] Date of Birth: February 11, 1951  Transition of Care [facility]) CM/SW [place], [place] Phone Number: 02/20/2020, 11:22 AM  Clinical Narrative:     3:09pm Pts Candace Cruise number is [phone] and good 10/22- 10/26.  CSW gave pt bed offers and medicare.gov rating list. Pt stated that they wanted [facility], which was not an offer. CSW reached out to [facility] and they do not accept the pts insurance.   Pt chose [facility]. CSW called [employer] to start insurance auth, reference number is [id number]. Passr number is [id number].  Pt reports he has had both covid vaccines.    Expected Discharge Plan: [place] Barriers to Discharge: Continued Medical Work up, Ship broker  Expected Discharge Plan and Services Expected Discharge Plan: [place] arrangements for the past 2 months: Single Family Home Expected Discharge Date: 02/16/20                                     Social Determinants of Health (SDOH) Interventions    Readmission Risk Interventions No flowsheet data found.  Emeterio Reeve, Latanya Presser, [place] Social Worker [phone]

## 2020-02-20 NOTE — Progress Notes (Signed)
Occupational Therapy Treatment Patient Details Name: Randy Shepherd MRN: [id number] DOB: 01-Feb-1951 Today's Date: 02/20/2020    History of present illness Pt is a 69 y.o. male who sustained R ankle fx 02/03/20 tx with splint/NWB and d/c home for swelling to subside, now admitted 02/16/20 and s/p R ankle ORIF. PMH includes stroke, HTN, DJD, depression.   OT comments  Pt making progress towards OT goals with focus on compensatory strategies and standing during ADLs. Pt with improving endurance with RW to/from bathroom, but notably SOB after ADLs. Pt completed bathing tasks seated and standing at sink with min guard, requiring intermittent cues to maintain NWB status during dynamic standing tasks. Continue to recommend SNF for short term rehab to maximize safety with ADLs and mobility.    Follow Up Recommendations  SNF;Supervision/Assistance - 24 hour    Equipment Recommendations  Wheelchair (measurements OT);Wheelchair cushion (measurements OT)    Recommendations for Other Services      Precautions / Restrictions Precautions Precautions: Fall Restrictions Weight Bearing Restrictions: Yes RLE Weight Bearing: Non weight bearing       Mobility Bed Mobility Overal bed mobility: Modified Independent Bed Mobility: Supine to Sit;Sit to Supine              Transfers Overall transfer level: Needs assistance Equipment used: Rolling walker (2 wheeled) Transfers: Sit to/from [facility] Sit to Stand: Min guard Stand pivot transfers: Min guard       General transfer comment: cues for hand placement - able to return demonstrate with cues. Min guard throughout for steadying, Min A for mobility with RW in hopping backwards due to posterior lean    Balance Overall balance assessment: Needs assistance Sitting-balance support: No upper extremity supported;Feet supported;Feet unsupported Sitting balance-Leahy Scale: Good     Standing balance support: During  functional activity;Single extremity supported Standing balance-Leahy Scale: Poor Standing balance comment: Reliant on UE support                           ADL either performed or assessed with clinical judgement   ADL Overall ADL's : Needs assistance/impaired     Grooming: Min guard;Standing;Oral care;Brushing hair Grooming Details (indicate cue type and reason): minguard for maintaining balance, cues for WB  Upper Body Bathing: Set up;Sitting Upper Body Bathing Details (indicate cue type and reason): setup sitting at sink  Lower Body Bathing: Min guard;Sit to/from stand Lower Body Bathing Details (indicate cue type and reason): min guard in standing to bathe LB, difficulty maintaining WB precautions during this task Upper Body Dressing : Set up;Sitting                   Functional mobility during ADLs: Minimal assistance;Rolling walker;Cueing for safety;Cueing for sequencing General ADL Comments: improved mobility with RW, but difficulty maintaining WB precautions in standing for ADLs but improving      Vision   Vision Assessment?: No apparent visual deficits   Perception     Praxis      Cognition Arousal/Alertness: Awake/alert Behavior During Therapy: Flat affect Overall Cognitive Status: Impaired/Different from baseline Area of Impairment: Safety/judgement;Problem solving                         Safety/Judgement: Decreased awareness of safety;Decreased awareness of deficits   Problem Solving: Requires verbal cues;Difficulty sequencing General Comments: Improved problem solving, but continued difficulty following WB precautions        Exercises  Shoulder Instructions       General Comments      Pertinent Vitals/ Pain       Pain Assessment: No/denies pain  Home Living                                          Prior Functioning/Environment              Frequency  Min 2X/week        Progress Toward  Goals  OT Goals(current goals can now be found in the care plan section)  Progress towards OT goals: Progressing toward goals  Acute Rehab OT Goals Patient Stated Goal: Hopeful for post-acute rehab services to regain strength OT Goal Formulation: With patient/family Time For Goal Achievement: 03/03/20 Potential to Achieve Goals: Good ADL Goals Pt Will Perform Grooming: with modified independence;standing Pt Will Perform Lower Body Bathing: with supervision;sitting/lateral leans;sit to/from stand Pt Will Perform Lower Body Dressing: with supervision;sit to/from stand;sitting/lateral leans Pt Will Transfer to Toilet: with supervision;ambulating;bedside commode Pt Will Perform Toileting - Clothing Manipulation and hygiene: with modified independence;sit to/from stand;sitting/lateral leans Pt/caregiver will Perform Home Exercise Program: Increased strength;Both right and left upper extremity;With theraband;Independently;With written HEP provided  Plan Discharge plan remains appropriate    Co-evaluation                 AM-PAC OT "6 Clicks" Daily Activity     Outcome Measure   Help from another person eating meals?: None Help from another person taking care of personal grooming?: A Little Help from another person toileting, which includes using toliet, bedpan, or urinal?: A Little Help from another person bathing (including washing, rinsing, drying)?: A Little Help from another person to put on and taking off regular upper body clothing?: A Little Help from another person to put on and taking off regular lower body clothing?: A Lot 6 Click Score: 18    End of Session Equipment Utilized During Treatment: Gait belt;Rolling walker  OT Visit Diagnosis: Unsteadiness on feet (R26.81);Other abnormalities of gait and mobility (R26.89);Muscle weakness (generalized) (M62.81);Pain Pain - Right/Left: Right Pain - part of body: Leg   Activity Tolerance Patient tolerated treatment well    Patient Left in bed;with call bell/phone within reach;with bed alarm set;with family/visitor present   Nurse Communication Mobility status        Time: 6222-9798 OT Time Calculation (min): 27 min  Charges: OT General Charges $OT Visit: 1 Visit OT Treatments $Self Care/Home Management : 23-37 mins  Layla Maw, OTR/L   Layla Maw 02/20/2020, 2:33 PM

## 2020-02-20 NOTE — Plan of Care (Addendum)
Urinary leg bag ordered and placed in Pt's room to be switched prior to dc.  Problem: Clinical Measurements: Goal: Ability to maintain clinical measurements within normal limits will improve Outcome: Progressing   Problem: Activity: Goal: Risk for activity intolerance will decrease Outcome: Progressing   Problem: Nutrition: Goal: Adequate nutrition will be maintained Outcome: Progressing   Problem: Coping: Goal: Level of anxiety will decrease Outcome: Progressing   Problem: Elimination: Goal: Will not experience complications related to bowel motility Outcome: Progressing   Problem: Pain Managment: Goal: General experience of comfort will improve Outcome: Progressing   Problem: Safety: Goal: Ability to remain free from injury will improve Outcome: Progressing   Problem: Skin Integrity: Goal: Risk for impaired skin integrity will decrease Outcome: Progressing

## 2020-02-20 NOTE — Social Work (Signed)
Pt and wife decided that they no longer want to go to [place] due to visitation policy.   Emeterio Reeve, Latanya Presser, [place] Social Worker [phone]

## 2020-02-20 NOTE — Progress Notes (Signed)
   Subjective: Doing well this am.  No issues with ankle of foley cath.       Objective:   VITALS:   Vitals:   02/19/20 0736 02/19/20 1309 02/19/20 1945 02/20/20 0310  BP: 138/82 135/80 134/83 130/62  Pulse: 92 81 84 78  Resp: 17 17 18 16   Temp: 98.4 F (36.9 C) 98.2 F (36.8 C) 98.3 F (36.8 C) 98.1 F (36.7 C)  TempSrc: Oral Oral Oral Axillary  SpO2: 99% 98% 96% 97%  Weight:      Height:       Right Lower Extremity:  Normal knee ROM. No pain with log roll. No calf pain. Slight improvement in toe swelling Clean posterior splint, intact.  Patient resting in bed in no acute distress.  Sensation intact to all toes.  able to move all his toes.   MOTOR:  +motor EHL (great toe dorsiflexion) + FHL (great toe plantar flexion)    VASCULAR:  capillary refill < 2   Lab Results  Component Value Date   WBC 12.6 (H) 02/16/2020   HGB 13.2 02/16/2020   HCT 40.7 02/16/2020   MCV 92.1 02/16/2020   PLT 317 02/16/2020   BMET    Component Value Date/Time   NA 140 02/16/2020 0607   K 3.6 02/16/2020 0607   CL 106 02/16/2020 0607   CO2 22 02/16/2020 0607   GLUCOSE 102 (H) 02/16/2020 0607   BUN 29 (H) 02/16/2020 0607   CREATININE 1.90 (H) 02/16/2020 0607   CALCIUM 9.3 02/16/2020 0607   GFRNONAA 35 (L) 02/16/2020 0607   GFRAA >60 07/19/2018 1001     Assessment/Plan: 4 Days Post-Op   Active Problems:   Bimalleolar fracture of right ankle, closed, initial encounter   Ankle fracture, bimalleolar, closed, right, initial encounter   Advance diet Up with therapy  (NWB to the right lower extremity)  SNF and wheelchair for ambulation due to fatigue with walker. Transition of care team saw the patient and recommended SNF placement, NCFL2 placed. D/C will be based on SNF placement/ urology recommendations.   - Urinary retention.  Likely BPH, will dc home with foley leg bag.  Follow up with Urology outpt.  Weightbearing Status: Nonweightbearing for 8 weeks to the right  lower extremity  DVT Prophylaxis: Asprin 81mg  Twice a Day for 8 weeks.  Return to clinic at [facility] at 2 weeks s/p surgery for suture removal and transition to cast.   Nicholes Stairs 02/20/2020, 7:36 AM

## 2020-02-21 DIAGNOSIS — S82841A Displaced bimalleolar fracture of right lower leg, initial encounter for closed fracture: Secondary | ICD-10-CM | POA: Diagnosis not present

## 2020-02-21 NOTE — Progress Notes (Signed)
Pt report given to Christy;receiving RN for [facility].

## 2020-02-21 NOTE — TOC Progression Note (Addendum)
Transition of Care [facility]) - Progression Note    Patient Details  Name: Randy Shepherd MRN: [id number] Date of Birth: April 12, 1951  Transition of Care [facility]) CM/SW [place], [place] Phone Number: 02/21/2020, 10:20 AM  Clinical Narrative:     CSW received a consult to speak with pt's spouse concerning SNF placement.  CSW spoke with pt's spouse by phone.  Pt's spouse requested SNF placement for pt at [facility]. Pt's spouse want s to make sure that she can see pt without covid restrictions. CSW spoke with Juliann Pulse at [facility].  [facility] does not have a male bed for weekend but may have one next week.  CSW informed pt's spouse.  Pt's spouse stated " He will have to go home then".  Pt's spouse also stated that they have been homeless since March, but pt can stay with a friend when discharged home.  The home has some steps but some of the men can assist getting him up the steps.  Pt will need a wheelchair.  CSW updated Case Mgr Levada Dy.  TOC Team will continue to assist with disposition planning. 11:09am: pt's spouse said pt will now go to  [place].  CSW confirmed bed availability at [place]. Pt has accepted  placement at [facility].  Expected Discharge Plan: [place] Barriers to Discharge: No Barriers Identified  Expected Discharge Plan and Services Expected Discharge Plan: [place] arrangements for the past 2 months: Single Family Home Expected Discharge Date: 02/16/20                                     Social Determinants of Health (SDOH) Interventions    Readmission Risk Interventions No flowsheet data found.

## 2020-02-21 NOTE — Progress Notes (Signed)
      Subjective: 5 Days Post-Op Procedure(s) (LRB): OPEN REDUCTION INTERNAL FIXATION (ORIF) ANKLE FRACTURE (Right)  Patient reports pain as 3 on 0-10 scale.   Denies CP or SOB.  Voiding without difficulty. Positive flatus. Objective: Vital signs in last 24 hours: Temp:  [97.6 F (36.4 C)-99.3 F (37.4 C)] 97.6 F (36.4 C) (10/23 0739) Pulse Rate:  [70-87] 77 (10/23 0739) Resp:  [15-18] 17 (10/23 0739) BP: (130-150)/(73-84) 141/84 (10/23 0739) SpO2:  [98 %-100 %] 98 % (10/23 0739)  Intake/Output from previous day: 10/22 0701 - 10/23 0700 In: 1440 [P.O.:1440] Out: 2400 [Urine:2400] Intake/Output this shift: Total I/O In: -  Out: 550 [Urine:550]  Labs: No results for input(s): HGB in the last 72 hours. No results for input(s): WBC, RBC, HCT, PLT in the last 72 hours. No results for input(s): NA, K, CL, CO2, BUN, CREATININE, GLUCOSE, CALCIUM in the last 72 hours. No results for input(s): LABPT, INR in the last 72 hours.  Physical Exam: Neurologically intact Intact pulses distally Compartment soft splint in good condition Body mass index is 31.93 kg/m.   Assessment/Plan: 5 Days Post-Op Procedure(s) (LRB): OPEN REDUCTION INTERNAL FIXATION (ORIF) ANKLE FRACTURE (Right) Discharge to SNF  D/C summary completed  Randy Shepherd for Dr. Melina Schools [facility] [phone] 02/21/2020, 1:27 PM

## 2020-02-21 NOTE — Discharge Summary (Addendum)
Patient ID: Randy Shepherd MRN: [id number] DOB/AGE: 1950/06/05 69 y.o.  Admit date: 02/16/2020 Discharge date: 02/21/2020  Admission Diagnoses:  Active Problems:   Bimalleolar fracture of right ankle, closed, initial encounter   Ankle fracture, bimalleolar, closed, right, initial encounter   Discharge Diagnoses:  Active Problems:   Bimalleolar fracture of right ankle, closed, initial encounter   Ankle fracture, bimalleolar, closed, right, initial encounter  status post Procedure(s): OPEN REDUCTION INTERNAL FIXATION (ORIF) ANKLE FRACTURE  Past Medical History:  Diagnosis Date  . Abscess 10/07/2012   RT KNEE  . Degenerative disc disease   . Depression   . DJD (degenerative joint disease)   . High cholesterol   . Hx of adenomatous colonic polyps 09/26/2017  . Hypertension   . Hypothyroidism   . Stroke [facility])     Surgeries: Procedure(s): OPEN REDUCTION INTERNAL FIXATION (ORIF) ANKLE FRACTURE on 02/16/2020   Consultants: Urology for urinary retention  Discharged Condition: Improved  Hospital Course: Randy Shepherd is an 69 y.o. male who was admitted 02/16/2020 for operative treatment of right ankle fracture. Patient failed conservative treatments (please see the history and physical for the specifics) and had severe unremitting pain that affects sleep, daily activities and work/hobbies. After pre-op clearance, the patient was taken to the operating room on 02/16/2020 and underwent  Procedure(s): OPEN REDUCTION INTERNAL FIXATION (ORIF) ANKLE FRACTURE. Patient had episode of urinary retention and so a foley was placed.  Follow up with urology as an outpatient was arranged.    Patient was given perioperative antibiotics:  Anti-infectives (From admission, onward)   Start     Dose/Rate Route Frequency Ordered Stop   02/16/20 2000  ceFAZolin (ANCEF) IVPB 1 g/50 mL premix        1 g 100 mL/hr over 30 Minutes Intravenous Every 6 hours 02/16/20 1146 02/17/20 0843    02/16/20 0615  ceFAZolin (ANCEF) IVPB 2g/100 mL premix        2 g 200 mL/hr over 30 Minutes Intravenous On call to O.R. 02/16/20 0603 02/16/20 0739       Patient was given sequential compression devices and early ambulation to prevent DVT.   Patient benefited maximally from hospital stay and there were no complications. At the time of discharge, the patient was moving their bowels without difficulty, tolerating a regular diet, pain is controlled with oral pain medications and they have been cleared by PT/OT.   Recent vital signs:  Patient Vitals for the past 24 hrs:  BP Temp Temp src Pulse Resp SpO2  02/21/20 0739 (!) 141/84 97.6 F (36.4 C) Oral 77 17 98 %  02/21/20 0300 (!) 144/76 98.9 F (37.2 C) Oral 70 16 100 %  02/20/20 1947 (!) 150/73 99.3 F (37.4 C) Oral 73 18 99 %  02/20/20 1430 130/84 98.1 F (36.7 C) Oral 87 15 100 %     Recent laboratory studies: No results for input(s): WBC, HGB, HCT, PLT, NA, K, CL, CO2, BUN, CREATININE, GLUCOSE, INR, CALCIUM in the last 72 hours.  Invalid input(s): PT, 2   Discharge Medications:   Allergies as of 02/21/2020   No Known Allergies     Medication List    STOP taking these medications   atorvastatin 40 MG tablet Commonly known as: LIPITOR   oxyCODONE-acetaminophen 5-325 MG tablet Commonly known as: PERCOCET/ROXICET     TAKE these medications   amLODipine 10 MG tablet Commonly known as: NORVASC Take 10 mg by mouth daily.   ARIPiprazole 10 MG tablet  Commonly known as: ABILIFY Take 5 mg by mouth daily.   aspirin EC 81 MG tablet Take 81 mg by mouth daily. Swallow whole.   benazepril 20 MG tablet Commonly known as: LOTENSIN Take 1 tablet (20 mg total) by mouth 2 (two) times daily. What changed: how much to take   buPROPion 150 MG 12 hr tablet Commonly known as: WELLBUTRIN SR Take 150 mg by mouth 2 (two) times daily.   Cholecalciferol 50 MCG (2000 UT) Caps Take 2,000 Units by mouth daily.   clopidogrel 75 MG  tablet Commonly known as: PLAVIX Take 1 tablet (75 mg total) by mouth daily.   gabapentin 300 MG capsule Commonly known as: NEURONTIN Take 900 mg by mouth daily.   levothyroxine 200 MCG tablet Commonly known as: SYNTHROID Take 200 mcg by mouth daily before breakfast.   ondansetron 4 MG disintegrating tablet Commonly known as: Zofran ODT Take 1 tablet (4 mg total) by mouth every 8 (eight) hours as needed.   oxyCODONE 5 MG immediate release tablet Commonly known as: Roxicodone Take 1 tablet (5 mg total) by mouth every 4 (four) hours as needed for moderate pain or severe pain.   sertraline 100 MG tablet Commonly known as: ZOLOFT Take 200 mg by mouth daily.       Diagnostic Studies: DG Ankle 2 Views Right  Result Date: 02/16/2020 CLINICAL DATA:  Open reduction and internal fixation of right ankle fracture. EXAM: RIGHT ANKLE - 2 VIEW; DG C-ARM 1-60 MIN Radiation exposure index: 1.57 mGy. COMPARISON:  February 03, 2020. FINDINGS: Two intraoperative fluoroscopic images were obtained of the right ankle. These demonstrate the patient be status post surgical internal fixation of distal right fibular fracture. Good alignment of fracture components is noted. IMPRESSION: Status post surgical internal fixation of distal right fibular fracture. Electronically Signed   By: Marijo Conception M.D.   On: 02/16/2020 08:59   DG Ankle Complete Right  Result Date: 02/03/2020 CLINICAL DATA:  Fall and injury EXAM: RIGHT ANKLE - COMPLETE 3+ VIEW COMPARISON:  None. FINDINGS: There is a comminuted mildly displaced posterior malleolar fracture. Obliquely oriented distal fibular fracture is also noted. There is widening of the medial clear space measuring up to 7 mm in transverse dimension. Overlying soft tissue swelling seen diffusely around the ankle. A small ankle joint effusion is seen. 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 Calcaneal enthesophytes are seen. IMPRESSION: Mildly displaced  distal fibular and posterior malleolar fractures with widening of the medial clear space. Electronically Signed   By: Prudencio Pair M.D.   On: 02/03/2020 00:41   DG C-Arm 1-60 Min  Result Date: 02/16/2020 CLINICAL DATA:  Open reduction and internal fixation of right ankle fracture. EXAM: RIGHT ANKLE - 2 VIEW; DG C-ARM 1-60 MIN Radiation exposure index: 1.57 mGy. COMPARISON:  February 03, 2020. FINDINGS: Two intraoperative fluoroscopic images were obtained of the right ankle. These demonstrate the patient be status post surgical internal fixation of distal right fibular fracture. Good alignment of fracture components is noted. IMPRESSION: Status post surgical internal fixation of distal right fibular fracture. Electronically Signed   By: Marijo Conception M.D.   On: 02/16/2020 08:59    Discharge Instructions    Call MD / Call 911   Complete by: As directed    If you experience chest pain or shortness of breath, CALL 911 and be transported to the hospital emergency room.  If you develope a fever above 101 F, pus (white drainage) or  increased drainage or redness at the wound, or calf pain, call your surgeon's office.   Constipation Prevention   Complete by: As directed    Drink plenty of fluids.  Prune juice may be helpful.  You may use a stool softener, such as Colace (over the counter) 100 mg twice a day.  Use MiraLax (over the counter) for constipation as needed.   Diet - low sodium heart healthy   Complete by: As directed    Increase activity slowly as tolerated   Complete by: As directed        Follow-up Information    Nicholes Stairs, MD In 2 weeks.   Specialty: Orthopedic Surgery Why: For suture removal, For wound re-check Contact information: [address] [place] [address] [phone]        [facility] In 2 weeks.   Contact information: [place] [phone]              Discharge  Plan:  discharge to SNF.  Foley to gravity . Monitor/record urine output.  Standard foley care.  Disposition: Stable    Signed: Dahlia Bailiff for Dr. Melina Schools [facility] [phone] 02/21/2020, 1:36 PM

## 2020-02-21 NOTE — Progress Notes (Signed)
[facility] transported pt to [facility] SNF.

## 2020-02-21 NOTE — TOC Transition Note (Signed)
Transition of Care [facility]) - CM/SW Discharge Note   Patient Details  Name: Randy Shepherd MRN: [id number] Date of Birth: [birth year]  Transition of Care [facility]) CM/SW Contact:  Gabrielle Dare Phone Number: 02/21/2020, 2:18 PM   Clinical Narrative:    Patient will Discharge UG:[id number] Nursing & Rehab Anticipated DC Date:02/21/20 Family Notified:yes, spouse Fairley Copher Transport CC:EQFD   Per MD patient ready for DC to [place] . RN, patient, patient's family, and facility notified of DC. Assessment, Fl2/Pasrr, and Discharge Summary sent to facility. RN given number for report [phone]). DC packet on chart. Ambulance transport requested for patient for 3:30pm  CSW signing off.  Reed Breech LCSWA [phone]     Final next level of care: Skilled Nursing Facility Barriers to Discharge: No Barriers Identified   Patient Goals and CMS Choice Patient states their goals for this hospitalization and ongoing recovery are:: to get better CMS Medicare.gov Compare Post Acute Care list provided to:: Patient Choice offered to / list presented to : Spouse  Discharge Placement              Patient chooses bed at: [facility] Patient to be transferred to facility by: [place] Name of family member notified: Elsworth Soho Patient and family notified of of transfer: 02/21/20  Discharge Plan and Services                                     Social Determinants of Health (SDOH) Interventions     Readmission Risk Interventions No flowsheet data found.

## 2020-03-09 ENCOUNTER — Other Ambulatory Visit: Payer: Self-pay

## 2020-03-09 ENCOUNTER — Emergency Department (HOSPITAL_COMMUNITY)
Admission: EM | Admit: 2020-03-09 | Discharge: 2020-03-10 | Disposition: A | Discharge status: Home / Self Care | Payer: No Typology Code available for payment source | Attending: Emergency Medicine | Admitting: Emergency Medicine

## 2020-03-09 ENCOUNTER — Encounter (HOSPITAL_COMMUNITY): Payer: Self-pay | Admitting: Emergency Medicine

## 2020-03-09 DIAGNOSIS — Z79899 Other long term (current) drug therapy: Secondary | ICD-10-CM | POA: Insufficient documentation

## 2020-03-09 DIAGNOSIS — Z87891 Personal history of nicotine dependence: Secondary | ICD-10-CM | POA: Diagnosis not present

## 2020-03-09 DIAGNOSIS — E039 Hypothyroidism, unspecified: Secondary | ICD-10-CM | POA: Insufficient documentation

## 2020-03-09 DIAGNOSIS — R339 Retention of urine, unspecified: Secondary | ICD-10-CM | POA: Diagnosis not present

## 2020-03-09 DIAGNOSIS — N471 Phimosis: Secondary | ICD-10-CM | POA: Insufficient documentation

## 2020-03-09 DIAGNOSIS — I10 Essential (primary) hypertension: Secondary | ICD-10-CM

## 2020-03-09 DIAGNOSIS — Z7982 Long term (current) use of aspirin: Secondary | ICD-10-CM | POA: Diagnosis not present

## 2020-03-09 DIAGNOSIS — T83091A Other mechanical complication of indwelling urethral catheter, initial encounter: Secondary | ICD-10-CM | POA: Insufficient documentation

## 2020-03-09 DIAGNOSIS — Z86010 Personal history of colonic polyps: Secondary | ICD-10-CM | POA: Insufficient documentation

## 2020-03-09 NOTE — ED Triage Notes (Signed)
Pt st's he had a catheter placed 1 week ago due to urinary retention.  Pt st's cat stopped draining today

## 2020-03-10 LAB — URINALYSIS, ROUTINE W REFLEX MICROSCOPIC
Bilirubin Urine: NEGATIVE
Glucose, UA: NEGATIVE mg/dL
Ketones, ur: NEGATIVE mg/dL
Nitrite: POSITIVE — AB
Protein, ur: >=300 mg/dL — AB
RBC / HPF: >50 RBC/hpf — ABNORMAL HIGH (ref 0–5)
Specific Gravity, Urine: 1.015 (ref 1.005–1.030)
WBC, UA: >50 WBC/hpf — ABNORMAL HIGH (ref 0–5)
pH: 8 (ref 5.0–8.0)

## 2020-03-10 NOTE — ED Notes (Signed)
Put leg drainage bag on pt

## 2020-03-10 NOTE — ED Provider Notes (Signed)
[facility] EMERGENCY DEPARTMENT Provider Note   CSN: [id number] Arrival date & time: 03/09/20  2309   History Chief Complaint  Patient presents with  . Cath problems    Randy Shepherd is a 69 y.o. male.  The history is provided by the patient.  He has history of hypertension, hyperlipidemia and had a Foley catheter placed about 1 week ago because of urinary retention.  He states that he drained his leg bag at about 5 PM, and has had no urine output since then.  He has a sense that his bladder is full.  Past Medical History:  Diagnosis Date  . Abscess 10/07/2012   RT KNEE  . Degenerative disc disease   . Depression   . DJD (degenerative joint disease)   . High cholesterol   . Hx of adenomatous colonic polyps 09/26/2017  . Hypertension   . Hypothyroidism   . Stroke [facility])     Patient Active Problem List   Diagnosis Date Noted  . Ankle fracture, bimalleolar, closed, right, initial encounter 02/19/2020  . Bimalleolar fracture of right ankle, closed, initial encounter 02/16/2020  . 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 At high risk for falls 07/19/2018  . Hypokalemia 07/18/2018  . CVA (cerebral vascular accident) ([place]) 07/17/2018  . Hx of adenomatous colonic polyps 09/26/2017  . 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 Stroke (cerebrum) ([place]) 04/11/2017  . Cellulitis and abscess of leg 10/06/2012  . Hypertension 10/06/2012  . Hyperlipidemia 10/06/2012  . History of stroke 10/06/2012  . Thyroid dysfunction 10/06/2012    Past Surgical History:  Procedure Laterality Date  . APPENDECTOMY    . COLONOSCOPY    . IR ANGIO INTRA EXTRACRAN SEL COM CAROTID INNOMINATE BILAT MOD SED  04/11/2017  . IR ANGIO VERTEBRAL SEL SUBCLAVIAN INNOMINATE UNI R MOD SED  04/11/2017  . IR ANGIO VERTEBRAL SEL VERTEBRAL UNI L MOD SED  04/11/2017  . IR RADIOLOGIST EVAL & MGMT  05/23/2017  . LOOP RECORDER INSERTION N/A 04/13/2017   Procedure: LOOP RECORDER INSERTION;  Surgeon: Thompson Grayer, MD;   Location: [place] [facility];  Service: Cardiovascular;  Laterality: N/A;  . lpop recorder    . ORIF ANKLE FRACTURE Right 02/16/2020   Procedure: OPEN REDUCTION INTERNAL FIXATION (ORIF) ANKLE FRACTURE;  Surgeon: Nicholes Stairs, MD;  Location: [place];  Service: Orthopedics;  Laterality: Right;  90 mins  . RADIOLOGY WITH ANESTHESIA N/A 04/11/2017   Procedure: IR WITH ANESTHESIA;  Surgeon: Luanne Bras, MD;  Location: [place];  Service: Radiology;  Laterality: N/A;       Family History  Problem Relation Age of Onset  . Cancer Mother        type unknown  . Heart disease Brother   . Throat cancer Brother   . Heart disease Brother     Social History   Tobacco Use  . Smoking status: Former Smoker    Quit date: 1990    Years since quitting: 31.8  . Smokeless tobacco: Never Used  Vaping Use  . Vaping Use: Never used  Substance Use Topics  . Alcohol use: Not Currently    Alcohol/week: 3.0 standard drinks    Types: 3 Cans of beer per week    Comment: DAILY BEER  . Drug use: No    Home Medications Prior to Admission medications   Medication Sig Start Date End Date Taking? Authorizing Provider  amLODipine (NORVASC) 10 MG tablet Take 10 mg by mouth daily.     [provider]  ARIPiprazole (ABILIFY) 10 MG tablet Take 5 mg by mouth daily.     [provider]  aspirin EC 81 MG tablet Take 81 mg by mouth daily. Swallow whole.    [provider]  benazepril (LOTENSIN) 20 MG tablet Take 1 tablet (20 mg total) by mouth 2 (two) times daily. Patient taking differently: Take by mouth 2 (two) times daily.  04/13/17   [name], NP  buPROPion (WELLBUTRIN SR) 150 MG 12 hr tablet Take 150 mg by mouth 2 (two) times daily.    [provider]  Cholecalciferol 50 MCG (2000 UT) CAPS Take 2,000 Units by mouth daily.    [provider]  clopidogrel (PLAVIX) 75 MG tablet Take 1 tablet (75 mg total) by mouth daily. 04/14/17   [name],  NP  gabapentin (NEURONTIN) 300 MG capsule Take 900 mg by mouth daily.     [provider]  levothyroxine (SYNTHROID, LEVOTHROID) 200 MCG tablet Take 200 mcg by mouth daily before breakfast.     [provider]  ondansetron (ZOFRAN ODT) 4 MG disintegrating tablet Take 1 tablet (4 mg total) by mouth every 8 (eight) hours as needed. 02/16/20   Nicholes Stairs, MD  oxyCODONE (ROXICODONE) 5 MG immediate release tablet Take 1 tablet (5 mg total) by mouth every 4 (four) hours as needed for moderate pain or severe pain. 02/20/20 02/19/21  Nicholes Stairs, MD  sertraline (ZOLOFT) 100 MG tablet Take 200 mg by mouth daily.     [provider]    Allergies    Patient has no known allergies.  Review of Systems   Review of Systems  All other systems reviewed and are negative.   Physical Exam Updated Vital Signs BP (!) 158/132 (BP Location: Right Arm)   Pulse (!) 125   Temp 98.3 F (36.8 C) (Oral)   Resp 18   Ht 5\' 8"  (1.727 m)   Wt 90.7 kg   SpO2 98%   BMI 30.41 kg/m   Physical Exam Vitals and nursing note reviewed.   69 year old male, appears uncomfortable, but is in no acute distress. Vital signs are significant for elevated blood pressure. Oxygen saturation is 98%, which is normal. Head is normocephalic and atraumatic. PERRLA, EOMI. Oropharynx is clear. Neck is nontender and supple without adenopathy or JVD. Back is nontender and there is no CVA tenderness. Lungs are clear without rales, wheezes, or rhonchi. Chest is nontender. Heart has regular rate and rhythm without murmur. Abdomen is soft, flat, nontender without masses or hepatosplenomegaly and peristalsis is normoactive.  Bladder is distended. Genitalia: Uncircumcised penis.  Foley catheter is in place. Extremities have no cyanosis or edema.  Short leg cast present on the right leg. Skin is warm and dry without rash. Neurologic: Mental status is normal, cranial nerves are intact, there are  no motor or sensory deficits.   ED Results / Procedures / Treatments   Labs (all labs ordered are listed, but only abnormal results are displayed) Labs Reviewed  URINE CULTURE  URINALYSIS, ROUTINE W REFLEX MICROSCOPIC   Procedures BLADDER CATHETERIZATION  Date/Time: 03/10/2020 1:57 AM Performed by: Delora Fuel, MD Authorized by: Delora Fuel, MD   Consent:    Consent obtained:  Verbal   Consent given by:  Patient   Risks discussed:  False passage, infection, urethral injury and pain   Alternatives discussed:  No treatment Pre-procedure details:    Procedure purpose:  Therapeutic   Preparation: Patient  was prepped and draped in usual sterile fashion   Anesthesia (see MAR for exact dosages):    Anesthesia method:  None Procedure details:    Provider performed due to:  Nurse unable to complete   Catheter insertion:  Indwelling   Catheter type:  Coude   Catheter size:  18 Fr   Bladder irrigation: no     Number of attempts:  1   Urine characteristics:  Blood-tinged Post-procedure details:    Patient tolerance of procedure:  Tolerated well, no immediate complications    Medications Ordered in ED Medications - No data to display  ED Course  I have reviewed the triage vital signs and the nursing notes.  Pertinent labs & imaging results that were available during my care of the patient were reviewed by me and considered in my medical decision making (see chart for details).  MDM Rules/Calculators/A&P Urinary retention secondary to obstructed Foley catheter.  Old records were reviewed, and Foley catheter was actually placed while he was in the hospital and had urinary retention postop open reduction internal fixation of right ankle fracture.  Catheter was placed on October 21.  Foley catheter is removed.  Nurses unable to insert Foley catheter.  I inserted an 39 French coud Foley catheter with initially bloody urine which cleared quickly.  There was phimosis noted which did  make the insertion more difficult.  Following catheter insertion, blood pressure has come down, but not to normal.  Heart rate has also decreased.  He is resting comfortably and felt to be safe for discharge at this point.  He is scheduled to see his urologist in 5 days and is to keep that appointment.  Final Clinical Impression(s) / ED Diagnoses Final diagnoses:  Obstructed Foley catheter, initial encounter ([place])  Phimosis  Elevated blood pressure reading with diagnosis of hypertension    Rx / DC Orders ED Discharge Orders    None       Delora Fuel, MD 40/97/35 0234

## 2020-03-10 NOTE — Discharge Instructions (Addendum)
Keep your scheduled appointment with the urologist.

## 2020-03-10 NOTE — ED Notes (Signed)
Removed previous foley that pt arrived with. Attempted to place foley x 2- once w regular 16Fr and once with 18Fr Coude, unsuccessful. MD made aware- MD going to attempt w 18Fr Coude.

## 2020-03-12 LAB — URINE CULTURE: Culture: >=100000 — AB

## 2020-03-13 ENCOUNTER — Telehealth: Payer: Self-pay | Admitting: *Deleted

## 2020-03-13 NOTE — Telephone Encounter (Signed)
Post ED Visit - Positive Culture Follow-up  Culture report reviewed by antimicrobial stewardship pharmacist: [place] Team []  Elenor Quinones, Pharm.D. []  Heide Guile, Pharm.D., BCPS AQ-ID []  Parks Neptune, Pharm.D., BCPS []  Alycia Rossetti, Pharm.D., BCPS []  [place], Pharm.D., BCPS, AAHIVP []  Legrand Como, Pharm.D., BCPS, AAHIVP [x]  Salome Arnt, PharmD, BCPS []  Johnnette Gourd, PharmD, BCPS []  Hughes Better, PharmD, BCPS []  Leeroy Cha, PharmD []  Laqueta Linden, PharmD, BCPS []  Albertina Parr, PharmD  [place] Team []  Leodis Sias, PharmD []  Lindell Spar, PharmD []  Royetta Asal, PharmD []  Graylin Shiver, Rph []  Rema Fendt) Glennon Mac, PharmD []  Arlyn Dunning, PharmD []  Netta Cedars, PharmD []  Dia Sitter, PharmD []  Leone Haven, PharmD []  Gretta Arab, PharmD []  Theodis Shove, PharmD []  Peggyann Juba, PharmD []  Reuel Boom, PharmD   Positive urine culture No treatment and follow up with Urology, Nuala Alpha, PA-C.  Harlon Flor Talley 03/13/2020, 11:00 AM

## 2020-03-15 ENCOUNTER — Other Ambulatory Visit: Payer: Self-pay | Admitting: Internal Medicine

## 2020-03-22 ENCOUNTER — Encounter: Payer: Self-pay | Admitting: *Deleted

## 2020-03-22 ENCOUNTER — Telehealth: Payer: Self-pay | Admitting: *Deleted

## 2020-03-22 DIAGNOSIS — Z006 Encounter for examination for normal comparison and control in clinical research program: Secondary | ICD-10-CM

## 2020-03-23 ENCOUNTER — Telehealth: Payer: Self-pay | Admitting: *Deleted

## 2020-03-23 NOTE — Telephone Encounter (Signed)
Left message for Patient to call research office @ [phone] to schedule loop interrogation for his Last STROKE ~AF research study visit.

## 2020-03-24 NOTE — Research (Signed)
error 

## 2020-03-31 ENCOUNTER — Encounter: Payer: Self-pay | Admitting: *Deleted

## 2020-03-31 ENCOUNTER — Emergency Department (HOSPITAL_COMMUNITY): Payer: No Typology Code available for payment source

## 2020-03-31 ENCOUNTER — Other Ambulatory Visit: Payer: Self-pay

## 2020-03-31 ENCOUNTER — Encounter (HOSPITAL_COMMUNITY): Payer: Self-pay | Admitting: Student

## 2020-03-31 ENCOUNTER — Inpatient Hospital Stay (HOSPITAL_COMMUNITY)
Admission: EM | Admit: 2020-03-31 | Discharge: 2020-04-04 | DRG: 699 | Disposition: A | Discharge status: Home / Self Care | Payer: No Typology Code available for payment source | Attending: Internal Medicine | Admitting: Internal Medicine

## 2020-03-31 DIAGNOSIS — T83518A Infection and inflammatory reaction due to other urinary catheter, initial encounter: Principal | ICD-10-CM | POA: Diagnosis present

## 2020-03-31 DIAGNOSIS — N179 Acute kidney failure, unspecified: Secondary | ICD-10-CM | POA: Diagnosis present

## 2020-03-31 DIAGNOSIS — R319 Hematuria, unspecified: Secondary | ICD-10-CM | POA: Diagnosis present

## 2020-03-31 DIAGNOSIS — Z7902 Long term (current) use of antithrombotics/antiplatelets: Secondary | ICD-10-CM

## 2020-03-31 DIAGNOSIS — Z79890 Hormone replacement therapy (postmenopausal): Secondary | ICD-10-CM

## 2020-03-31 DIAGNOSIS — Z79899 Other long term (current) drug therapy: Secondary | ICD-10-CM

## 2020-03-31 DIAGNOSIS — Z7982 Long term (current) use of aspirin: Secondary | ICD-10-CM

## 2020-03-31 DIAGNOSIS — Z5900 Homelessness unspecified: Secondary | ICD-10-CM

## 2020-03-31 DIAGNOSIS — E7800 Pure hypercholesterolemia, unspecified: Secondary | ICD-10-CM | POA: Diagnosis present

## 2020-03-31 DIAGNOSIS — I69320 Aphasia following cerebral infarction: Secondary | ICD-10-CM

## 2020-03-31 DIAGNOSIS — R338 Other retention of urine: Secondary | ICD-10-CM | POA: Diagnosis present

## 2020-03-31 DIAGNOSIS — N390 Urinary tract infection, site not specified: Secondary | ICD-10-CM | POA: Diagnosis present

## 2020-03-31 DIAGNOSIS — T83511A Infection and inflammatory reaction due to indwelling urethral catheter, initial encounter: Secondary | ICD-10-CM | POA: Diagnosis present

## 2020-03-31 DIAGNOSIS — E039 Hypothyroidism, unspecified: Secondary | ICD-10-CM | POA: Diagnosis present

## 2020-03-31 DIAGNOSIS — Z8673 Personal history of transient ischemic attack (TIA), and cerebral infarction without residual deficits: Secondary | ICD-10-CM

## 2020-03-31 DIAGNOSIS — Z9119 Patient's noncompliance with other medical treatment and regimen: Secondary | ICD-10-CM

## 2020-03-31 DIAGNOSIS — Z87891 Personal history of nicotine dependence: Secondary | ICD-10-CM

## 2020-03-31 DIAGNOSIS — Z006 Encounter for examination for normal comparison and control in clinical research program: Secondary | ICD-10-CM

## 2020-03-31 DIAGNOSIS — N401 Enlarged prostate with lower urinary tract symptoms: Secondary | ICD-10-CM | POA: Diagnosis present

## 2020-03-31 DIAGNOSIS — Z20822 Contact with and (suspected) exposure to covid-19: Secondary | ICD-10-CM | POA: Diagnosis present

## 2020-03-31 DIAGNOSIS — Y846 Urinary catheterization as the cause of abnormal reaction of the patient, or of later complication, without mention of misadventure at the time of the procedure: Secondary | ICD-10-CM | POA: Diagnosis present

## 2020-03-31 DIAGNOSIS — Z5902 Unsheltered homelessness: Secondary | ICD-10-CM

## 2020-03-31 DIAGNOSIS — I10 Essential (primary) hypertension: Secondary | ICD-10-CM | POA: Diagnosis present

## 2020-03-31 DIAGNOSIS — E876 Hypokalemia: Secondary | ICD-10-CM | POA: Diagnosis present

## 2020-03-31 LAB — CBC WITH DIFFERENTIAL/PLATELET
Abs Immature Granulocytes: 0.2 10*3/uL — ABNORMAL HIGH (ref 0.00–0.07)
Basophils Absolute: 0.1 10*3/uL (ref 0.0–0.1)
Basophils Relative: 0 %
Eosinophils Absolute: 0 10*3/uL (ref 0.0–0.5)
Eosinophils Relative: 0 %
HCT: 44 % (ref 39.0–52.0)
Hemoglobin: 14.8 g/dL (ref 13.0–17.0)
Immature Granulocytes: 1 %
Lymphocytes Relative: 12 %
Lymphs Abs: 2.6 10*3/uL (ref 0.7–4.0)
MCH: 29.7 pg (ref 26.0–34.0)
MCHC: 33.6 g/dL (ref 30.0–36.0)
MCV: 88.2 fL (ref 80.0–100.0)
Monocytes Absolute: 2.1 10*3/uL — ABNORMAL HIGH (ref 0.1–1.0)
Monocytes Relative: 10 %
Neutro Abs: 16.8 10*3/uL — ABNORMAL HIGH (ref 1.7–7.7)
Neutrophils Relative %: 77 %
Platelets: 345 10*3/uL (ref 150–400)
RBC: 4.99 MIL/uL (ref 4.22–5.81)
RDW: 14.5 % (ref 11.5–15.5)
WBC: 21.8 10*3/uL — ABNORMAL HIGH (ref 4.0–10.5)
nRBC: 0 % (ref 0.0–0.2)

## 2020-03-31 LAB — BASIC METABOLIC PANEL
Anion gap: 20 — ABNORMAL HIGH (ref 5–15)
BUN: 49 mg/dL — ABNORMAL HIGH (ref 8–23)
CO2: 17 mmol/L — ABNORMAL LOW (ref 22–32)
Calcium: 9.6 mg/dL (ref 8.9–10.3)
Chloride: 101 mmol/L (ref 98–111)
Creatinine, Ser: 2.22 mg/dL — ABNORMAL HIGH (ref 0.61–1.24)
GFR, Estimated: 31 mL/min — ABNORMAL LOW (ref >60)
Glucose, Bld: 94 mg/dL (ref 70–99)
Potassium: 3.5 mmol/L (ref 3.5–5.1)
Sodium: 138 mmol/L (ref 135–145)

## 2020-03-31 MED ORDER — SODIUM CHLORIDE 0.9 % IV BOLUS
500.0000 mL | Freq: Once | INTRAVENOUS | Status: AC
  Administered 2020-04-01: 500 mL via INTRAVENOUS

## 2020-03-31 NOTE — ED Notes (Signed)
Patient to CT.

## 2020-03-31 NOTE — ED Notes (Signed)
Pt put in room with vitals overdue

## 2020-03-31 NOTE — ED Triage Notes (Signed)
Patient BIB POV c/o catheter issues.  Patient is concerned that his "catheter is halfway out and that he has blood in his urine."  Patient has on a leg bag that has urine in it and no blood is seen.  Patient does have a brief on that has what looks like mucous in it.  Patient states he has had the catheter for 3-4 weeks, was placed for urinary retention.  Has been having abdominal pain for about a week now.

## 2020-03-31 NOTE — ED Provider Notes (Signed)
[facility] DEPT Provider Note   CSN: [id number] Arrival date & time: 03/31/20  1546     History Chief Complaint  Patient presents with  . catheter issues    Randy Shepherd is a 69 y.o. male.  The history is provided by the patient, the spouse and medical records.   Randy Shepherd is a 69 y.o. male who presents to the Emergency Department complaining of catheter problems. He presents the emergency department accompanied by his wife for evaluation of difficulty with his Foley catheter. He states that the catheter was placed one month ago after he had urinary retention of following foot surgery. The catheter was due to be discontinued today. On Saturday he states that the catheter was partially dislodged and he has been leaking urine around the catheter since then. He has not had any urine in the catheter bag since Saturday. Yesterday he had significant penile pain, this is now resolved. He denies any fevers, abdominal pain, nausea, vomiting, shortness of breath. He has not been able to follow-up with urology secondary to being homeless and has no money.    Past Medical History:  Diagnosis Date  . Abscess 10/07/2012   RT KNEE  . Degenerative disc disease   . Depression   . DJD (degenerative joint disease)   . High cholesterol   . Hx of adenomatous colonic polyps 09/26/2017  . Hypertension   . Hypothyroidism   . Stroke [facility])     Patient Active Problem List   Diagnosis Date Noted  . Ankle fracture, bimalleolar, closed, right, initial encounter 02/19/2020  . Bimalleolar fracture of right ankle, closed, initial encounter 02/16/2020  . 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 At high risk for falls 07/19/2018  . Hypokalemia 07/18/2018  . CVA (cerebral vascular accident) ([place]) 07/17/2018  . Hx of adenomatous colonic polyps 09/26/2017  . 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 Cellulitis and abscess of leg 10/06/2012  .  Hypertension 10/06/2012  . Hyperlipidemia 10/06/2012  . History of stroke 10/06/2012  . Thyroid dysfunction 10/06/2012    Past Surgical History:  Procedure Laterality Date  . APPENDECTOMY    . COLONOSCOPY    . IR ANGIO INTRA EXTRACRAN SEL COM CAROTID INNOMINATE BILAT MOD SED  04/11/2017  . IR ANGIO VERTEBRAL SEL SUBCLAVIAN INNOMINATE UNI R MOD SED  04/11/2017  . IR ANGIO VERTEBRAL SEL VERTEBRAL UNI L MOD SED  04/11/2017  . IR RADIOLOGIST EVAL & MGMT  05/23/2017  . LOOP RECORDER INSERTION N/A 04/13/2017   Procedure: LOOP RECORDER INSERTION;  Surgeon: Thompson Grayer, MD;  Location: [facility];  Service: Cardiovascular;  Laterality: N/A;  . lpop recorder    . ORIF ANKLE FRACTURE Right 02/16/2020   Procedure: OPEN REDUCTION INTERNAL FIXATION (ORIF) ANKLE FRACTURE;  Surgeon: Nicholes Stairs, MD;  Location: [facility];  Service: Orthopedics;  Laterality: Right;  90 mins  . RADIOLOGY WITH ANESTHESIA N/A 04/11/2017   Procedure: IR WITH ANESTHESIA;  Surgeon: Luanne Bras, MD;  Location: [facility];  Service: Radiology;  Laterality: N/A;       Family History  Problem Relation Age of Onset  . Cancer Mother        type unknown  . Heart disease Brother   . Throat cancer Brother   . Heart disease Brother     Social History   Tobacco Use  . Smoking status: Former Smoker    Quit date: 1990    Years since quitting:  31.9  . Smokeless tobacco: Never Used  Vaping Use  . Vaping Use: Never used  Substance Use Topics  . Alcohol use: Not Currently    Alcohol/week: 3.0 standard drinks    Types: 3 Cans of beer per week    Comment: DAILY BEER  . Drug use: No    Home Medications Prior to Admission medications   Medication Sig Start Date End Date Taking? Authorizing Provider  amLODipine (NORVASC) 10 MG tablet Take 10 mg by mouth daily.     [provider]  ARIPiprazole (ABILIFY) 10 MG tablet Take 5 mg by mouth daily.     [provider]  aspirin EC 81 MG tablet  Take 81 mg by mouth daily. Swallow whole.    [provider]  benazepril (LOTENSIN) 40 MG tablet Take 40 mg by mouth daily.    [provider]  buPROPion (WELLBUTRIN SR) 150 MG 12 hr tablet Take 150 mg by mouth 2 (two) times daily.    [provider]  Cholecalciferol 50 MCG (2000 UT) CAPS Take 2,000 Units by mouth daily.    [provider]  clopidogrel (PLAVIX) 75 MG tablet Take 1 tablet (75 mg total) by mouth daily. 04/14/17   [name], NP  gabapentin (NEURONTIN) 300 MG capsule Take 300 mg by mouth 3 (three) times daily.     [provider]  levothyroxine (SYNTHROID) 88 MCG tablet Take 176 mcg by mouth daily before breakfast.     [provider]  ondansetron (ZOFRAN ODT) 4 MG disintegrating tablet Take 1 tablet (4 mg total) by mouth every 8 (eight) hours as needed. 02/16/20   Nicholes Stairs, MD  oxyCODONE (ROXICODONE) 5 MG immediate release tablet Take 1 tablet (5 mg total) by mouth every 4 (four) hours as needed for moderate pain or severe pain. Patient not taking: Reported on 03/10/2020 02/20/20 02/19/21  Nicholes Stairs, MD  sertraline (ZOLOFT) 100 MG tablet Take 200 mg by mouth daily.     [provider]    Allergies    Patient has no known allergies.  Review of Systems   Review of Systems  All other systems reviewed and are negative.   Physical Exam Updated Vital Signs BP 130/79 (BP Location: Right Arm)   Pulse 79   Temp 97.9 F (36.6 C) (Oral)   Resp 16   Ht 5\' 8"  (1.727 m)   Wt 90.7 kg   SpO2 99%   BMI 30.41 kg/m   Physical Exam Vitals and nursing note reviewed.  Constitutional:      Appearance: He is well-developed.  HENT:     Head: Normocephalic and atraumatic.  Cardiovascular:     Rate and Rhythm: Regular rhythm. Tachycardia present.     Heart sounds: No murmur heard.   Pulmonary:     Effort: Pulmonary effort is normal. No respiratory distress.     Breath sounds: Normal breath  sounds.  Abdominal:     Palpations: Abdomen is soft.     Tenderness: There is no abdominal tenderness. There is no guarding or rebound.  Genitourinary:    Comments: Briefs full of foul-smelling urine. 62 French catheter in place with no urine in the catheter bag. Musculoskeletal:        General: No tenderness.  Skin:    General: Skin is warm and dry.  Neurological:     Mental Status: He is alert and oriented to person, place, and time.  Psychiatric:  Behavior: Behavior normal.     ED Results / Procedures / Treatments   Labs (all labs ordered are listed, but only abnormal results are displayed) Labs Reviewed  URINALYSIS, ROUTINE W REFLEX MICROSCOPIC - Abnormal; Notable for the following components:      Result Value   Color, Urine AMBER (*)    APPearance CLOUDY (*)    Hgb urine dipstick MODERATE (*)    Ketones, ur 80 (*)    Protein, ur 100 (*)    Leukocytes,Ua LARGE (*)    RBC / HPF >50 (*)    WBC, UA >50 (*)    Bacteria, UA MANY (*)    All other components within normal limits  BASIC METABOLIC PANEL - Abnormal; Notable for the following components:   CO2 17 (*)    BUN 49 (*)    Creatinine, Ser 2.22 (*)    GFR, Estimated 31 (*)    Anion gap 20 (*)    All other components within normal limits  CBC WITH DIFFERENTIAL/PLATELET - Abnormal; Notable for the following components:   WBC 21.8 (*)    Neutro Abs 16.8 (*)    Monocytes Absolute 2.1 (*)    Abs Immature Granulocytes 0.20 (*)    All other components within normal limits  URINE CULTURE  RESP PANEL BY RT-PCR (FLU A&B, COVID) [id number]    EKG None  Radiology CT Renal Stone Study  Result Date: 04/01/2020 CLINICAL DATA:  Acute renal failure EXAM: CT ABDOMEN AND PELVIS WITHOUT CONTRAST TECHNIQUE: Multidetector CT imaging of the abdomen and pelvis was performed following the standard protocol without IV contrast. COMPARISON:  None. FINDINGS: Lower chest: No acute abnormality. Hepatobiliary: No focal liver  abnormality is seen. No gallstones, gallbladder wall thickening, or biliary dilatation. Pancreas: Unremarkable. No pancreatic ductal dilatation or surrounding inflammatory changes. Spleen: Normal in size without focal abnormality. Adrenals/Urinary Tract: Adrenal glands are within normal limits. The kidneys are well visualized bilaterally without renal calculi or obstructive changes. Ureters are within normal limits. Bladder is decompressed by Foley catheter. Some air is noted within the bladder. Additionally some hyperdense material is noted which likely represents thrombus given the clinical history. No contrast was administered for this exam. Stomach/Bowel: No obstructive or inflammatory changes of the colon are seen. The appendix has been surgically removed. No small bowel or gastric abnormality is noted. Vascular/Lymphatic: Aortic atherosclerosis. No enlarged abdominal or pelvic lymph nodes. Reproductive: Prostate is unremarkable. Other: No abdominal wall hernia or abnormality. No abdominopelvic ascites. Musculoskeletal: No acute or significant osseous findings. IMPRESSION: Foley catheter is well seated within the bladder. Some hyperdense material is noted which may represent thrombus given the hematuria. No obstructive changes of the collecting systems are seen. No other focal abnormality is noted. Electronically Signed   By: Inez Catalina M.D.   On: 04/01/2020 00:12    Procedures Procedures (including critical care time)  Medications Ordered in ED Medications  aztreonam (AZACTAM) 2 g in sodium chloride 0.9 % 100 mL IVPB (2 g Intravenous New Bag/Given 04/01/20 0013)  sodium chloride 0.9 % bolus 500 mL (500 mLs Intravenous New Bag/Given (Non-Interop) 04/01/20 0005)    ED Course  I have reviewed the triage vital signs and the nursing notes.  Pertinent labs & imaging results that were available during my care of the patient were reviewed by me and considered in my medical decision making (see chart for  details).    MDM Rules/Calculators/A&P  Patient presents the emergency department for evaluation of pain, decreased urinary output in his back for the last several days. He is chronically ill appearing on evaluation, tachycardic on ED presentation. CBC with significant leukocytosis. BMP with elevation is creatinine compared to baseline. Patient unable to avoid after removal of his catheter. A new catheter was placed without difficulty. Urinalysis sent off of new catheter. Urine that return from the catheter is bloodied and cloudy, no clots. Given AKI CT stone study obtained, no evidence of obstruction. Given vital signs abnormalities, AKI and complicated catheter related infection and patient's inability to follow up plan to treat with IV fluids, IV antibiotics and admit for ongoing treatment.  Final Clinical Impression(s) / ED Diagnoses Final diagnoses:  Acute UTI  AKI (acute kidney injury) [facility])    Rx / [place] Orders ED Discharge Orders    None       Quintella Reichert, MD 04/01/20 0020

## 2020-04-01 ENCOUNTER — Encounter: Payer: Self-pay | Admitting: *Deleted

## 2020-04-01 DIAGNOSIS — E7800 Pure hypercholesterolemia, unspecified: Secondary | ICD-10-CM | POA: Diagnosis present

## 2020-04-01 DIAGNOSIS — Z5900 Homelessness unspecified: Secondary | ICD-10-CM

## 2020-04-01 DIAGNOSIS — N179 Acute kidney failure, unspecified: Secondary | ICD-10-CM | POA: Diagnosis present

## 2020-04-01 DIAGNOSIS — E876 Hypokalemia: Secondary | ICD-10-CM | POA: Diagnosis present

## 2020-04-01 DIAGNOSIS — T83511A Infection and inflammatory reaction due to indwelling urethral catheter, initial encounter: Secondary | ICD-10-CM | POA: Diagnosis not present

## 2020-04-01 DIAGNOSIS — R319 Hematuria, unspecified: Secondary | ICD-10-CM | POA: Diagnosis present

## 2020-04-01 DIAGNOSIS — Z79890 Hormone replacement therapy (postmenopausal): Secondary | ICD-10-CM | POA: Diagnosis not present

## 2020-04-01 DIAGNOSIS — Z87891 Personal history of nicotine dependence: Secondary | ICD-10-CM | POA: Diagnosis not present

## 2020-04-01 DIAGNOSIS — Z7902 Long term (current) use of antithrombotics/antiplatelets: Secondary | ICD-10-CM | POA: Diagnosis not present

## 2020-04-01 DIAGNOSIS — Z5902 Unsheltered homelessness: Secondary | ICD-10-CM | POA: Diagnosis not present

## 2020-04-01 DIAGNOSIS — Y846 Urinary catheterization as the cause of abnormal reaction of the patient, or of later complication, without mention of misadventure at the time of the procedure: Secondary | ICD-10-CM | POA: Diagnosis present

## 2020-04-01 DIAGNOSIS — Z006 Encounter for examination for normal comparison and control in clinical research program: Secondary | ICD-10-CM

## 2020-04-01 DIAGNOSIS — I69320 Aphasia following cerebral infarction: Secondary | ICD-10-CM | POA: Diagnosis not present

## 2020-04-01 DIAGNOSIS — R338 Other retention of urine: Secondary | ICD-10-CM | POA: Diagnosis present

## 2020-04-01 DIAGNOSIS — T83518A Infection and inflammatory reaction due to other urinary catheter, initial encounter: Secondary | ICD-10-CM | POA: Diagnosis present

## 2020-04-01 DIAGNOSIS — Z79899 Other long term (current) drug therapy: Secondary | ICD-10-CM | POA: Diagnosis not present

## 2020-04-01 DIAGNOSIS — Z7982 Long term (current) use of aspirin: Secondary | ICD-10-CM | POA: Diagnosis not present

## 2020-04-01 DIAGNOSIS — E039 Hypothyroidism, unspecified: Secondary | ICD-10-CM | POA: Diagnosis present

## 2020-04-01 DIAGNOSIS — N390 Urinary tract infection, site not specified: Secondary | ICD-10-CM | POA: Diagnosis present

## 2020-04-01 DIAGNOSIS — Z8673 Personal history of transient ischemic attack (TIA), and cerebral infarction without residual deficits: Secondary | ICD-10-CM | POA: Diagnosis not present

## 2020-04-01 DIAGNOSIS — N401 Enlarged prostate with lower urinary tract symptoms: Secondary | ICD-10-CM | POA: Diagnosis present

## 2020-04-01 DIAGNOSIS — Z9119 Patient's noncompliance with other medical treatment and regimen: Secondary | ICD-10-CM | POA: Diagnosis not present

## 2020-04-01 DIAGNOSIS — I10 Essential (primary) hypertension: Secondary | ICD-10-CM | POA: Diagnosis present

## 2020-04-01 DIAGNOSIS — Z20822 Contact with and (suspected) exposure to covid-19: Secondary | ICD-10-CM | POA: Diagnosis present

## 2020-04-01 LAB — URINALYSIS, ROUTINE W REFLEX MICROSCOPIC
Bilirubin Urine: NEGATIVE
Glucose, UA: NEGATIVE mg/dL
Ketones, ur: 80 mg/dL — AB
Nitrite: NEGATIVE
Protein, ur: 100 mg/dL — AB
RBC / HPF: >50 RBC/hpf — ABNORMAL HIGH (ref 0–5)
Specific Gravity, Urine: 1.012 (ref 1.005–1.030)
WBC, UA: >50 WBC/hpf — ABNORMAL HIGH (ref 0–5)
pH: 8 (ref 5.0–8.0)

## 2020-04-01 LAB — MAGNESIUM: Magnesium: 2.3 mg/dL (ref 1.7–2.4)

## 2020-04-01 LAB — RESP PANEL BY RT-PCR (FLU A&B, COVID) ARPGX2
Influenza A by PCR: NEGATIVE
Influenza B by PCR: NEGATIVE
SARS Coronavirus 2 by RT PCR: NEGATIVE

## 2020-04-01 LAB — BASIC METABOLIC PANEL
Anion gap: 15 (ref 5–15)
BUN: 43 mg/dL — ABNORMAL HIGH (ref 8–23)
CO2: 18 mmol/L — ABNORMAL LOW (ref 22–32)
Calcium: 9.3 mg/dL (ref 8.9–10.3)
Chloride: 105 mmol/L (ref 98–111)
Creatinine, Ser: 1.5 mg/dL — ABNORMAL HIGH (ref 0.61–1.24)
GFR, Estimated: 50 mL/min — ABNORMAL LOW (ref >60)
Glucose, Bld: 107 mg/dL — ABNORMAL HIGH (ref 70–99)
Potassium: 2.9 mmol/L — ABNORMAL LOW (ref 3.5–5.1)
Sodium: 138 mmol/L (ref 135–145)

## 2020-04-01 LAB — CBC
HCT: 41.3 % (ref 39.0–52.0)
Hemoglobin: 13.6 g/dL (ref 13.0–17.0)
MCH: 29.4 pg (ref 26.0–34.0)
MCHC: 32.9 g/dL (ref 30.0–36.0)
MCV: 89.4 fL (ref 80.0–100.0)
Platelets: 272 10*3/uL (ref 150–400)
RBC: 4.62 MIL/uL (ref 4.22–5.81)
RDW: 14.5 % (ref 11.5–15.5)
WBC: 16.9 10*3/uL — ABNORMAL HIGH (ref 4.0–10.5)
nRBC: 0 % (ref 0.0–0.2)

## 2020-04-01 LAB — HIV ANTIBODY (ROUTINE TESTING W REFLEX): HIV Screen 4th Generation wRfx: NONREACTIVE

## 2020-04-01 MED ORDER — ACETAMINOPHEN 325 MG PO TABS
650.0000 mg | ORAL_TABLET | Freq: Four times a day (QID) | ORAL | Status: DC | PRN
  Administered 2020-04-02: 18:00:00 650 mg via ORAL
  Filled 2020-04-01: qty 2

## 2020-04-01 MED ORDER — POTASSIUM CHLORIDE IN NACL 20-0.9 MEQ/L-% IV SOLN
INTRAVENOUS | Status: DC
  Filled 2020-04-01 (×3): qty 1000

## 2020-04-01 MED ORDER — CHLORHEXIDINE GLUCONATE CLOTH 2 % EX PADS
6.0000 | MEDICATED_PAD | Freq: Every day | CUTANEOUS | Status: DC
  Administered 2020-04-01 – 2020-04-03 (×3): 6 via TOPICAL

## 2020-04-01 MED ORDER — TAMSULOSIN HCL 0.4 MG PO CAPS
0.4000 mg | ORAL_CAPSULE | Freq: Every day | ORAL | Status: DC
  Administered 2020-04-01 – 2020-04-03 (×3): 0.4 mg via ORAL
  Filled 2020-04-01 (×3): qty 1

## 2020-04-01 MED ORDER — ASPIRIN EC 81 MG PO TBEC
81.0000 mg | DELAYED_RELEASE_TABLET | Freq: Every day | ORAL | Status: DC
  Administered 2020-04-01 – 2020-04-02 (×2): 81 mg via ORAL
  Filled 2020-04-01 (×2): qty 1

## 2020-04-01 MED ORDER — SODIUM CHLORIDE 0.9 % IV SOLN
2.0000 g | INTRAVENOUS | Status: DC
  Administered 2020-04-01: 08:00:00 2 g via INTRAVENOUS
  Filled 2020-04-01 (×2): qty 2

## 2020-04-01 MED ORDER — ONDANSETRON HCL 4 MG/2ML IJ SOLN: 4.0000 mg | Freq: Four times a day (QID) | INTRAMUSCULAR | Status: DC | PRN

## 2020-04-01 MED ORDER — CLOPIDOGREL BISULFATE 75 MG PO TABS
75.0000 mg | ORAL_TABLET | Freq: Every day | ORAL | Status: DC
  Administered 2020-04-01 – 2020-04-02 (×2): 75 mg via ORAL
  Filled 2020-04-01 (×2): qty 1

## 2020-04-01 MED ORDER — LEVOTHYROXINE SODIUM 88 MCG PO TABS
176.0000 ug | ORAL_TABLET | Freq: Every day | ORAL | Status: DC
  Administered 2020-04-02 – 2020-04-04 (×3): 176 ug via ORAL
  Filled 2020-04-01 (×3): qty 2

## 2020-04-01 MED ORDER — ACETAMINOPHEN 650 MG RE SUPP: 650.0000 mg | Freq: Four times a day (QID) | RECTAL | Status: DC | PRN

## 2020-04-01 MED ORDER — SODIUM CHLORIDE 0.9 % IV SOLN
2.0000 g | Freq: Once | INTRAVENOUS | Status: AC
  Administered 2020-04-01: 2 g via INTRAVENOUS
  Filled 2020-04-01: qty 2

## 2020-04-01 MED ORDER — ONDANSETRON HCL 4 MG PO TABS: 4.0000 mg | ORAL_TABLET | Freq: Four times a day (QID) | ORAL | Status: DC | PRN

## 2020-04-01 MED ORDER — POTASSIUM CHLORIDE CRYS ER 20 MEQ PO TBCR
40.0000 meq | EXTENDED_RELEASE_TABLET | ORAL | Status: AC
  Administered 2020-04-01 (×2): 40 meq via ORAL
  Filled 2020-04-01 (×2): qty 2

## 2020-04-01 NOTE — Progress Notes (Signed)
Patient ID: Randy Shepherd, male   DOB: [birth year], 69 y.o.   MRN: [id number] Patient was admitted early this morning for hematuria and catheterization was found to have UTI and AKI. New catheter was placed in the ED. He was started on IV fluids and antibiotics. Patient seen and examined at bedside and plan of care discussed with him. I have reviewed patient medical records including this morning's H&P, current vitals, labs, medications myself. Creatinine and WBCs improving. Potassium 2.9. Will replace potassium. Repeat a.m. labs. Social worker consult for homelessness.

## 2020-04-01 NOTE — ED Notes (Signed)
ED TO INPATIENT HANDOFF REPORT  Name/Age/Gender Randy Shepherd 69 y.o. male  Code Status    Code Status Orders  (From admission, onward)         Start     Ordered   04/01/20 0126  Full code  Continuous        04/01/20 0207        Code Status History    Date Active Date Inactive Code Status Order ID Comments User Context   02/16/2020 1146 02/21/2020 2306 Full Code [id number]  Nicholes Stairs, MD Inpatient   07/17/2018 1505 07/19/2018 1906 Full Code [id number]  Velna Ochs, MD Inpatient   04/11/2017 0838 04/15/2017 1839 Full Code [id number]  Greta Doom, MD ED   Advance Care Planning Activity      Home/SNF/Other Home  Chief Complaint Catheter-associated urinary tract infection ([place]) [T88.828M, N39.0]  Level of Care/Admitting Diagnosis ED Disposition    ED Disposition Condition [place]: [facility] [place] [100102]  Level of Care: Med-Surg [16]  May admit patient to [facility] or [facility] if equivalent level of care is available:: Yes  Covid Evaluation: Asymptomatic Screening Protocol (No Symptoms)  Diagnosis: Catheter-associated urinary tract infection [facility]) [[id number]]  Admitting Physician: Doreatha Massed  Attending Physician: Etta Quill [phone]  Estimated length of stay: past midnight tomorrow  Certification:: I certify this patient will need inpatient services for at least 2 midnights       Medical History Past Medical History:  Diagnosis Date  . Abscess 10/07/2012   RT KNEE  . Degenerative disc disease   . Depression   . DJD (degenerative joint disease)   . High cholesterol   . Hx of adenomatous colonic polyps 09/26/2017  . Hypertension   . Hypothyroidism   . Stroke [facility])     Allergies No Known Allergies  IV Location/Drains/Wounds Patient Lines/Drains/Airways Status    Active Line/Drains/Airways    Name Placement date Placement time Site Days   Peripheral IV 03/31/20 Right  Antecubital 03/31/20  2144  Antecubital  1   Urethral Catheter Coude 18 Fr. 03/31/20  2340  Coude  1   Incision 10/06/12 Knee Right 10/06/12  --   2734   Incision (Closed) 02/16/20 Ankle Right 02/16/20  0907   45          Labs/Imaging Results for orders placed or performed during the hospital encounter of 03/31/20 (from the past 48 hour(s))  Basic metabolic panel     Status: Abnormal   Collection Time: 03/31/20  9:43 PM  Result Value Ref Range   Sodium 138 135 - 145 mmol/L   Potassium 3.5 3.5 - 5.1 mmol/L   Chloride 101 98 - 111 mmol/L   CO2 17 (L) 22 - 32 mmol/L   Glucose, Bld 94 70 - 99 mg/dL    Comment: Glucose reference range applies only to samples taken after fasting for at least 8 hours.   BUN 49 (H) 8 - 23 mg/dL   Creatinine, Ser 2.22 (H) 0.61 - 1.24 mg/dL   Calcium 9.6 8.9 - 10.3 mg/dL   GFR, Estimated 31 (L) >60 mL/min    Comment: (NOTE) Calculated using the CKD-EPI Creatinine Equation (2021)    Anion gap 20 (H) 5 - 15    Comment: Performed at [facility], [place] [address] [place], [place] [address]  CBC with Differential     Status: Abnormal   Collection Time: 03/31/20  9:43  PM  Result Value Ref Range   WBC 21.8 (H) 4.0 - 10.5 K/uL   RBC 4.99 4.22 - 5.81 MIL/uL   Hemoglobin 14.8 13.0 - 17.0 g/dL   HCT 44.0 39 - 52 %   MCV 88.2 80.0 - 100.0 fL   MCH 29.7 26.0 - 34.0 pg   MCHC 33.6 30.0 - 36.0 g/dL   RDW 14.5 11.5 - 15.5 %   Platelets 345 150 - 400 K/uL   nRBC 0.0 0.0 - 0.2 %   Neutrophils Relative % 77 %   Neutro Abs 16.8 (H) 1.7 - 7.7 K/uL   Lymphocytes Relative 12 %   Lymphs Abs 2.6 0.7 - 4.0 K/uL   Monocytes Relative 10 %   Monocytes Absolute 2.1 (H) 0.1 - 1.0 K/uL   Eosinophils Relative 0 %   Eosinophils Absolute 0.0 0.0 - 0.5 K/uL   Basophils Relative 0 %   Basophils Absolute 0.1 0.0 - 0.1 K/uL   Immature Granulocytes 1 %   Abs Immature Granulocytes 0.20 (H) 0.00 - 0.07 K/uL    Comment: Performed at [facility], [place] [address] [place], [place] [address]  Urinalysis, Routine w reflex microscopic Urine, Catheterized     Status: Abnormal   Collection Time: 03/31/20 11:00 PM  Result Value Ref Range   Color, Urine AMBER (A) YELLOW   APPearance CLOUDY (A) CLEAR   Specific Gravity, Urine 1.012 1.005 - 1.030   pH 8.0 5.0 - 8.0   Glucose, UA NEGATIVE NEGATIVE mg/dL   Hgb urine dipstick MODERATE (A) NEGATIVE   Bilirubin Urine NEGATIVE NEGATIVE   Ketones, ur 80 (A) NEGATIVE mg/dL   Protein, ur 100 (A) NEGATIVE mg/dL   Nitrite NEGATIVE NEGATIVE   Leukocytes,Ua LARGE (A) NEGATIVE   RBC / HPF >50 (H) 0 - 5 RBC/hpf   WBC, UA >50 (H) 0 - 5 WBC/hpf   Bacteria, UA MANY (A) NONE SEEN    Comment: Performed at [facility], [place] [address], [place], [place] [address]  Resp Panel by RT-PCR (Flu A&B, Covid) Nasopharyngeal Swab     Status: None   Collection Time: 04/01/20 12:15 AM   Specimen: Nasopharyngeal Swab; Nasopharyngeal(NP) swabs in vial transport medium  Result Value Ref Range   SARS Coronavirus 2 by RT PCR NEGATIVE NEGATIVE    Comment: (NOTE) SARS-CoV-2 target nucleic acids are NOT DETECTED.  The SARS-CoV-2 RNA is generally detectable in upper respiratory specimens during the acute phase of infection. The lowest concentration of SARS-CoV-2 viral copies this assay can detect is 138 copies/mL. A negative result does not preclude SARS-Cov-2 infection and should not be used as the sole basis for treatment or other patient management decisions. A negative result may occur with  improper specimen collection/handling, submission of specimen other than nasopharyngeal swab, presence of viral mutation(s) within the areas targeted by this assay, and inadequate number of viral copies(<138 copies/mL). A negative result must be combined with clinical observations, patient history, and epidemiological information. The expected result is Negative.  Fact Sheet for Patients:   EntrepreneurPulse.com.au  Fact Sheet for Healthcare Providers:  IncredibleEmployment.be  This test is no t yet approved or cleared by the [place] FDA and  has been authorized for detection and/or diagnosis of SARS-CoV-2 by FDA under an Emergency Use Authorization (EUA). This EUA will remain  in effect (meaning this test can be used) for the duration of the COVID-19 declaration under Section 564(b)(1) of the Act, 21 U.S.C.section 360bbb-3(b)(1), unless the  authorization is terminated  or revoked sooner.       Influenza A by PCR NEGATIVE NEGATIVE   Influenza B by PCR NEGATIVE NEGATIVE    Comment: (NOTE) The Xpert Xpress SARS-CoV-2/FLU/RSV plus assay is intended as an aid in the diagnosis of influenza from Nasopharyngeal swab specimens and should not be used as a sole basis for treatment. Nasal washings and aspirates are unacceptable for Xpert Xpress SARS-CoV-2/FLU/RSV testing.  Fact Sheet for Patients: EntrepreneurPulse.com.au  Fact Sheet for Healthcare Providers: IncredibleEmployment.be  This test is not yet approved or cleared by the [place] FDA and has been authorized for detection and/or diagnosis of SARS-CoV-2 by FDA under an Emergency Use Authorization (EUA). This EUA will remain in effect (meaning this test can be used) for the duration of the COVID-19 declaration under Section 564(b)(1) of the Act, 21 U.S.C. section 360bbb-3(b)(1), unless the authorization is terminated or revoked.  Performed at [facility], [place] [address], [place], [place] [address]    CT Renal Stone Study  Result Date: 04/01/2020 CLINICAL DATA:  Acute renal failure EXAM: CT ABDOMEN AND PELVIS WITHOUT CONTRAST TECHNIQUE: Multidetector CT imaging of the abdomen and pelvis was performed following the standard protocol without IV contrast. COMPARISON:  None. FINDINGS: Lower chest: No acute  abnormality. Hepatobiliary: No focal liver abnormality is seen. No gallstones, gallbladder wall thickening, or biliary dilatation. Pancreas: Unremarkable. No pancreatic ductal dilatation or surrounding inflammatory changes. Spleen: Normal in size without focal abnormality. Adrenals/Urinary Tract: Adrenal glands are within normal limits. The kidneys are well visualized bilaterally without renal calculi or obstructive changes. Ureters are within normal limits. Bladder is decompressed by Foley catheter. Some air is noted within the bladder. Additionally some hyperdense material is noted which likely represents thrombus given the clinical history. No contrast was administered for this exam. Stomach/Bowel: No obstructive or inflammatory changes of the colon are seen. The appendix has been surgically removed. No small bowel or gastric abnormality is noted. Vascular/Lymphatic: Aortic atherosclerosis. No enlarged abdominal or pelvic lymph nodes. Reproductive: Prostate is unremarkable. Other: No abdominal wall hernia or abnormality. No abdominopelvic ascites. Musculoskeletal: No acute or significant osseous findings. IMPRESSION: Foley catheter is well seated within the bladder. Some hyperdense material is noted which may represent thrombus given the hematuria. No obstructive changes of the collecting systems are seen. No other focal abnormality is noted. Electronically Signed   By: Inez Catalina M.D.   On: 04/01/2020 00:12    Pending Labs Unresulted Labs (From admission, onward)          Start     Ordered   04/01/20 0500  CBC  Tomorrow morning,   R        04/01/20 0207   04/01/20 9532  Basic metabolic panel  Tomorrow morning,   R        04/01/20 0207   04/01/20 0126  HIV Antibody (routine testing w rflx)  (HIV Antibody (Routine testing w reflex) panel)  Once,   STAT        04/01/20 0207   03/31/20 2104  Urine culture  ONCE - STAT,   STAT        03/31/20 2104          Vitals/Pain Today's Vitals    04/01/20 0014 04/01/20 0030 04/01/20 0100 04/01/20 0130  BP: 130/79 131/74 122/76 112/69  Pulse: 79 79 80 73  Resp: 16   20  Temp:      TempSrc:      SpO2: 99% 98%  97% 98%  Weight:      Height:      PainSc:        Isolation Precautions No active isolations  Medications Medications  ceFEPIme (MAXIPIME) 2 g in sodium chloride 0.9 % 100 mL IVPB (has no administration in time range)  acetaminophen (TYLENOL) tablet 650 mg (has no administration in time range)    Or  acetaminophen (TYLENOL) suppository 650 mg (has no administration in time range)  ondansetron (ZOFRAN) tablet 4 mg (has no administration in time range)    Or  ondansetron (ZOFRAN) injection 4 mg (has no administration in time range)  sodium chloride 0.9 % bolus 500 mL (0 mLs Intravenous Stopped 04/01/20 0114)  aztreonam (AZACTAM) 2 g in sodium chloride 0.9 % 100 mL IVPB (0 g Intravenous Stopped 04/01/20 0114)    Mobility walks with person assist

## 2020-04-01 NOTE — Research (Signed)
Opened on wrong date

## 2020-04-01 NOTE — H&P (Signed)
History and Physical    Randy Shepherd IDP:[id number] DOB: 01-Sep-1950 DOA: 03/31/2020  PCP: [facility]  Patient coming from: Homeless, living in car  I have personally briefly reviewed patient's old medical records in [place]  Chief Complaint: Hematuria, catheter issues  HPI: Randy Shepherd is a 69 y.o. male with medical history significant of HTN, prior stroke with residual aphasia, homelessness.  Pt presents to ED with hematuria and difficulty with foley catheter.  Catheter was placed 1 month ago after urinary retention following foot surgery.  He was supposed to follow up with urology to DC catheter today, but has no money and so couldn't follow up with them.  On sat the catheter was partially dislodged and he has been leaking urine around catheter since that time, no urine in bag since Sat.  Yesterday developed significant penile pain that has now resolved.  No fevers, chills, abd pain, N/V, SOB.   ED Course: New catheter placed without difficulty with frankly bloody and cloudy urine, no clots.  UA c/w UTI  Pt has AKI with creat 2.22.  WBC 21k.  Given aztreonam in ED.   Review of Systems: As per HPI, otherwise all review of systems negative.  Past Medical History:  Diagnosis Date  . Abscess 10/07/2012   RT KNEE  . Degenerative disc disease   . Depression   . DJD (degenerative joint disease)   . High cholesterol   . Hx of adenomatous colonic polyps 09/26/2017  . Hypertension   . Hypothyroidism   . Stroke [facility])     Past Surgical History:  Procedure Laterality Date  . APPENDECTOMY    . COLONOSCOPY    . IR ANGIO INTRA EXTRACRAN SEL COM CAROTID INNOMINATE BILAT MOD SED  04/11/2017  . IR ANGIO VERTEBRAL SEL SUBCLAVIAN INNOMINATE UNI R MOD SED  04/11/2017  . IR ANGIO VERTEBRAL SEL VERTEBRAL UNI L MOD SED  04/11/2017  . IR RADIOLOGIST EVAL & MGMT  05/23/2017  . LOOP RECORDER INSERTION N/A 04/13/2017   Procedure: LOOP RECORDER  INSERTION;  Surgeon: Thompson Grayer, MD;  Location: [place] [facility];  Service: Cardiovascular;  Laterality: N/A;  . lpop recorder    . ORIF ANKLE FRACTURE Right 02/16/2020   Procedure: OPEN REDUCTION INTERNAL FIXATION (ORIF) ANKLE FRACTURE;  Surgeon: Nicholes Stairs, MD;  Location: [place];  Service: Orthopedics;  Laterality: Right;  90 mins  . RADIOLOGY WITH ANESTHESIA N/A 04/11/2017   Procedure: IR WITH ANESTHESIA;  Surgeon: Luanne Bras, MD;  Location: [place];  Service: Radiology;  Laterality: N/A;     reports that he quit smoking about 31 years ago. He has never used smokeless tobacco. He reports previous alcohol use of about 3.0 standard drinks of alcohol per week. He reports that he does not use drugs.  No Known Allergies  Family History  Problem Relation Age of Onset  . Cancer Mother        type unknown  . Heart disease Brother   . Throat cancer Brother   . Heart disease Brother      Prior to Admission medications   Medication Sig Start Date End Date Taking? Authorizing Provider  amLODipine (NORVASC) 10 MG tablet Take 10 mg by mouth daily.    Yes [provider]  ARIPiprazole (ABILIFY) 10 MG tablet Take 5 mg by mouth daily.    Yes [provider]  aspirin EC 81 MG tablet Take 81 mg by mouth daily. Swallow whole.   Yes  [provider]  benazepril (LOTENSIN) 40 MG tablet Take 40 mg by mouth daily.   Yes [provider]  buPROPion (WELLBUTRIN SR) 150 MG 12 hr tablet Take 150 mg by mouth 2 (two) times daily.   Yes [provider]  Cholecalciferol 50 MCG (2000 UT) CAPS Take 2,000 Units by mouth daily.   Yes [provider]  clopidogrel (PLAVIX) 75 MG tablet Take 1 tablet (75 mg total) by mouth daily. 04/14/17  Yes Costello, Kayren Eaves, NP  gabapentin (NEURONTIN) 300 MG capsule Take 300 mg by mouth 3 (three) times daily.    Yes [provider]  levothyroxine (SYNTHROID) 88 MCG tablet Take 176 mcg by mouth daily  before breakfast.    Yes [provider]  sertraline (ZOLOFT) 100 MG tablet Take 200 mg by mouth daily.    Yes [provider]  ondansetron (ZOFRAN ODT) 4 MG disintegrating tablet Take 1 tablet (4 mg total) by mouth every 8 (eight) hours as needed. Patient not taking: Reported on 04/01/2020 02/16/20   Nicholes Stairs, MD  oxyCODONE (ROXICODONE) 5 MG immediate release tablet Take 1 tablet (5 mg total) by mouth every 4 (four) hours as needed for moderate pain or severe pain. Patient not taking: Reported on 03/10/2020 02/20/20 02/19/21  Nicholes Stairs, MD    Physical Exam: Vitals:   04/01/20 0014 04/01/20 0030 04/01/20 0100 04/01/20 0130  BP: 130/79 131/74 122/76 112/69  Pulse: 79 79 80 73  Resp: 16   20  Temp:      TempSrc:      SpO2: 99% 98% 97% 98%  Weight:      Height:        Constitutional: NAD, calm, comfortable Eyes: PERRL, lids and conjunctivae normal ENMT: Mucous membranes are moist. Posterior pharynx clear of any exudate or lesions.Normal dentition.  Neck: normal, supple, no masses, no thyromegaly Respiratory: clear to auscultation bilaterally, no wheezing, no crackles. Normal respiratory effort. No accessory muscle use.  Cardiovascular: Regular rate and rhythm, no murmurs / rubs / gallops. No extremity edema. 2+ pedal pulses. No carotid bruits.  Abdomen: no tenderness, no masses palpated. No hepatosplenomegaly. Bowel sounds positive.  Foul smelling bloody urine. Musculoskeletal: no clubbing / cyanosis. No joint deformity upper and lower extremities. Good ROM, no contractures. Normal muscle tone.  Skin: no rashes, lesions, ulcers. No induration Neurologic: CN 2-12 grossly intact. Sensation intact, DTR normal. Strength 5/5 in all 4. Significantly slurred speech that is apparently baseline per wife. Psychiatric: Normal judgment and insight. Alert and oriented x 3. Normal mood.    Labs on Admission: I have personally reviewed following labs and  imaging studies  CBC: Recent Labs  Lab 03/31/20 2143  WBC 21.8*  NEUTROABS 16.8*  HGB 14.8  HCT 44.0  MCV 88.2  PLT 096   Basic Metabolic Panel: Recent Labs  Lab 03/31/20 2143  NA 138  K 3.5  CL 101  CO2 17*  GLUCOSE 94  BUN 49*  CREATININE 2.22*  CALCIUM 9.6   GFR: Estimated Creatinine Clearance: 34.8 mL/min (A) (by C-G formula based on SCr of 2.22 mg/dL (H)). Liver Function Tests: No results for input(s): AST, ALT, ALKPHOS, BILITOT, PROT, ALBUMIN in the last 168 hours. No results for input(s): LIPASE, AMYLASE in the last 168 hours. No results for input(s): AMMONIA in the last 168 hours. Coagulation Profile: No results for input(s): INR, PROTIME in the last 168 hours. Cardiac Enzymes: No results for input(s): CKTOTAL, CKMB, CKMBINDEX, TROPONINI in the  last 168 hours. BNP (last 3 results) No results for input(s): PROBNP in the last 8760 hours. HbA1C: No results for input(s): HGBA1C in the last 72 hours. CBG: No results for input(s): GLUCAP in the last 168 hours. Lipid Profile: No results for input(s): CHOL, HDL, LDLCALC, TRIG, CHOLHDL, LDLDIRECT in the last 72 hours. Thyroid Function Tests: No results for input(s): TSH, T4TOTAL, FREET4, T3FREE, THYROIDAB in the last 72 hours. Anemia Panel: No results for input(s): VITAMINB12, FOLATE, FERRITIN, TIBC, IRON, RETICCTPCT in the last 72 hours. Urine analysis:    Component Value Date/Time   COLORURINE AMBER (A) 03/31/2020 2300   APPEARANCEUR CLOUDY (A) 03/31/2020 2300   LABSPEC 1.012 03/31/2020 2300   PHURINE 8.0 03/31/2020 2300   GLUCOSEU NEGATIVE 03/31/2020 2300   HGBUR MODERATE (A) 03/31/2020 2300   BILIRUBINUR NEGATIVE 03/31/2020 2300   KETONESUR 80 (A) 03/31/2020 2300   PROTEINUR 100 (A) 03/31/2020 2300   NITRITE NEGATIVE 03/31/2020 2300   LEUKOCYTESUR LARGE (A) 03/31/2020 2300    Radiological Exams on Admission: CT Renal Stone Study  Result Date: 04/01/2020 CLINICAL DATA:  Acute renal failure EXAM: CT  ABDOMEN AND PELVIS WITHOUT CONTRAST TECHNIQUE: Multidetector CT imaging of the abdomen and pelvis was performed following the standard protocol without IV contrast. COMPARISON:  None. FINDINGS: Lower chest: No acute abnormality. Hepatobiliary: No focal liver abnormality is seen. No gallstones, gallbladder wall thickening, or biliary dilatation. Pancreas: Unremarkable. No pancreatic ductal dilatation or surrounding inflammatory changes. Spleen: Normal in size without focal abnormality. Adrenals/Urinary Tract: Adrenal glands are within normal limits. The kidneys are well visualized bilaterally without renal calculi or obstructive changes. Ureters are within normal limits. Bladder is decompressed by Foley catheter. Some air is noted within the bladder. Additionally some hyperdense material is noted which likely represents thrombus given the clinical history. No contrast was administered for this exam. Stomach/Bowel: No obstructive or inflammatory changes of the colon are seen. The appendix has been surgically removed. No small bowel or gastric abnormality is noted. Vascular/Lymphatic: Aortic atherosclerosis. No enlarged abdominal or pelvic lymph nodes. Reproductive: Prostate is unremarkable. Other: No abdominal wall hernia or abnormality. No abdominopelvic ascites. Musculoskeletal: No acute or significant osseous findings. IMPRESSION: Foley catheter is well seated within the bladder. Some hyperdense material is noted which may represent thrombus given the hematuria. No obstructive changes of the collecting systems are seen. No other focal abnormality is noted. Electronically Signed   By: Inez Catalina M.D.   On: 04/01/2020 00:12    EKG: Independently reviewed.  Assessment/Plan Principal Problem:   Urinary tract infection associated with catheterization of urinary tract, initial encounter [facility]) Active Problems:   History of stroke   AKI (acute kidney injury) ([place])   Homeless   Catheter-associated urinary  tract infection ([place])    1. CAUTI - 1. Cefepime 2. Repeat CBC in AM 3. Foley changed out and now draining 2. AKI - 1. Possibly just obstructive uropathy in setting of non-draining foley vs ATN from the CAUTI 2. Got IVF in ED 3. Repeat BMP in AM 3. Homeless - 1. SW consult 2. Candidate chronic ELF placement to try and get a roof over his and maybe even wife's head?  I would assume patient and wife have reached "spend-down" at this point financially as they are currently living in car.  Does look like he has VA benefits. 4. H/o Stroke - 1. With residual deficits to speech at least. 2. Qualify as disabled? 3. SW consult  DVT prophylaxis: SCDs Code Status: Full Family  Communication: Wife at bedside Disposition Plan: TBD Consults called: None Admission status: Admit to inpatient  Severity of Illness: The appropriate patient status for this patient is INPATIENT. Inpatient status is judged to be reasonable and necessary in order to provide the required intensity of service to ensure the patient's safety. The patient's presenting symptoms, physical exam findings, and initial radiographic and laboratory data in the context of their chronic comorbidities is felt to place them at high risk for further clinical deterioration. Furthermore, it is not anticipated that the patient will be medically stable for discharge from the hospital within 2 midnights of admission. The following factors support the patient status of inpatient.   IP status due to CAUTI complicated by AKI.   * I certify that at the point of admission it is my clinical judgment that the patient will require inpatient hospital care spanning beyond 2 midnights from the point of admission due to high intensity of service, high risk for further deterioration and high frequency of surveillance required.*    Ilona Colley M. DO [facility]  How to contact the [facility] Attending or Consulting provider [place] or covering provider  during after hours [place], for this patient?  1. Check the care team in [facility] and look for a) attending/consulting TRH provider listed and b) the [facility] team listed 2. Log into [email or web address]  Amion Physician Scheduling and messaging for groups and whole hospitals  On call and physician scheduling software for group practices, residents, hospitalists and other medical providers for call, clinic, rotation and shift schedules. OnCall Enterprise is a hospital-wide system for scheduling doctors and paging doctors on call. EasyPlot is for scientific plotting and data analysis.  [email or web address]  and use 's universal password to access. If you do not have the password, please contact the hospital operator.  3. Locate the [facility] provider you are looking for under [facility] and page to a number that you can be directly reached. 4. If you still have difficulty reaching the provider, please page the [facility] (Director on Call) for the Hospitalists listed on amion for assistance.  04/01/2020, 2:12 AM

## 2020-04-01 NOTE — TOC Progression Note (Addendum)
Transition of Care [facility]) - Progression Note    Patient Details  Name: Randy Shepherd MRN: [id number] Date of Birth: 1951/02/11  Transition of [facility]) CM/SW Contact  Leeroy Cha, RN Phone Number: 04/01/2020, 12:26 PM  Clinical Narrative:    Patient has informed me that and his wife have been living in their car for the past several months.  Wife states that they gone to all the county agencies for housing but were told that they make to much money for assistance through the ss income.  Patient is a English as a second language teacher.  Information about IRC and VA help given to the wife. Patient refuses to go to the urban [employer].        Expected Discharge Plan and Services                                                 Social Determinants of Health (SDOH) Interventions    Readmission Risk Interventions No flowsheet data found.

## 2020-04-01 NOTE — Progress Notes (Signed)
Pharmacy Antibiotic Note  Randy Shepherd is a 69 y.o. male admitted on 03/31/2020 with catheter problems.  Pharmacy has been consulted for cefepime dosing for UTI  Plan: Cefepime 2gm IV q24h Follow renal function and clinical course  Height: 5\' 8"  (172.7 cm) Weight: 90.7 kg (200 lb) IBW/kg (Calculated) : 68.4  Temp (24hrs), Avg:97.9 F (36.6 C), Min:97.9 F (36.6 C), Max:97.9 F (36.6 C)  Recent Labs  Lab 03/31/20 2143  WBC 21.8*  CREATININE 2.22*    Estimated Creatinine Clearance: 34.8 mL/min (A) (by C-G formula based on SCr of 2.22 mg/dL (H)).    No Known Allergies  Antimicrobials this admission: 12/2 aztreonam x 1 12/2 cefepime >>   Dose adjustments this admission:   Microbiology results: 12/1  UCx:     Thank you for allowing pharmacy to be a part of this patient's care.  Dolly Rias RPh 04/01/2020, 1:07 AM

## 2020-04-01 NOTE — Telephone Encounter (Signed)
Went to [facility] ER to see patient for his 86M visit for Stroke AF.

## 2020-04-02 ENCOUNTER — Inpatient Hospital Stay (HOSPITAL_COMMUNITY): Payer: No Typology Code available for payment source

## 2020-04-02 DIAGNOSIS — T83511A Infection and inflammatory reaction due to indwelling urethral catheter, initial encounter: Secondary | ICD-10-CM | POA: Diagnosis not present

## 2020-04-02 DIAGNOSIS — N179 Acute kidney failure, unspecified: Secondary | ICD-10-CM | POA: Diagnosis not present

## 2020-04-02 DIAGNOSIS — Z5900 Homelessness unspecified: Secondary | ICD-10-CM

## 2020-04-02 DIAGNOSIS — Z8673 Personal history of transient ischemic attack (TIA), and cerebral infarction without residual deficits: Secondary | ICD-10-CM

## 2020-04-02 DIAGNOSIS — N390 Urinary tract infection, site not specified: Secondary | ICD-10-CM | POA: Diagnosis not present

## 2020-04-02 LAB — CBC WITH DIFFERENTIAL/PLATELET
Abs Immature Granulocytes: 0.13 10*3/uL — ABNORMAL HIGH (ref 0.00–0.07)
Basophils Absolute: 0.1 10*3/uL (ref 0.0–0.1)
Basophils Relative: 1 %
Eosinophils Absolute: 0.2 10*3/uL (ref 0.0–0.5)
Eosinophils Relative: 2 %
HCT: 39.1 % (ref 39.0–52.0)
Hemoglobin: 13.2 g/dL (ref 13.0–17.0)
Immature Granulocytes: 1 %
Lymphocytes Relative: 20 %
Lymphs Abs: 2.8 10*3/uL (ref 0.7–4.0)
MCH: 29.7 pg (ref 26.0–34.0)
MCHC: 33.8 g/dL (ref 30.0–36.0)
MCV: 88.1 fL (ref 80.0–100.0)
Monocytes Absolute: 1.4 10*3/uL — ABNORMAL HIGH (ref 0.1–1.0)
Monocytes Relative: 10 %
Neutro Abs: 9.1 10*3/uL — ABNORMAL HIGH (ref 1.7–7.7)
Neutrophils Relative %: 66 %
Platelets: 240 10*3/uL (ref 150–400)
RBC: 4.44 MIL/uL (ref 4.22–5.81)
RDW: 14.2 % (ref 11.5–15.5)
WBC: 13.9 10*3/uL — ABNORMAL HIGH (ref 4.0–10.5)
nRBC: 0 % (ref 0.0–0.2)

## 2020-04-02 LAB — BASIC METABOLIC PANEL
Anion gap: 12 (ref 5–15)
BUN: 17 mg/dL (ref 8–23)
CO2: 21 mmol/L — ABNORMAL LOW (ref 22–32)
Calcium: 8.8 mg/dL — ABNORMAL LOW (ref 8.9–10.3)
Chloride: 104 mmol/L (ref 98–111)
Creatinine, Ser: 0.86 mg/dL (ref 0.61–1.24)
GFR, Estimated: >60 mL/min (ref >60)
Glucose, Bld: 101 mg/dL — ABNORMAL HIGH (ref 70–99)
Potassium: 3.3 mmol/L — ABNORMAL LOW (ref 3.5–5.1)
Sodium: 137 mmol/L (ref 135–145)

## 2020-04-02 LAB — MAGNESIUM: Magnesium: 2 mg/dL (ref 1.7–2.4)

## 2020-04-02 MED ORDER — SODIUM CHLORIDE 0.9 % IV SOLN
1.0000 g | INTRAVENOUS | Status: DC
  Administered 2020-04-02 – 2020-04-04 (×3): 1 g via INTRAVENOUS
  Filled 2020-04-02 (×3): qty 1

## 2020-04-02 MED ORDER — SODIUM CHLORIDE 0.9 % IV SOLN
2.0000 g | Freq: Two times a day (BID) | INTRAVENOUS | Status: DC
  Filled 2020-04-02: qty 2

## 2020-04-02 MED ORDER — POTASSIUM CHLORIDE CRYS ER 20 MEQ PO TBCR
40.0000 meq | EXTENDED_RELEASE_TABLET | ORAL | Status: AC
  Administered 2020-04-02 (×2): 40 meq via ORAL
  Filled 2020-04-02 (×2): qty 2

## 2020-04-02 MED ORDER — AMLODIPINE BESYLATE 10 MG PO TABS
10.0000 mg | ORAL_TABLET | Freq: Every day | ORAL | Status: DC
  Administered 2020-04-02 – 2020-04-04 (×3): 10 mg via ORAL
  Filled 2020-04-02 (×3): qty 1

## 2020-04-02 MED ORDER — IOHEXOL 300 MG/ML  SOLN
100.0000 mL | Freq: Once | INTRAMUSCULAR | Status: AC | PRN
  Administered 2020-04-02: 100 mL via INTRAVENOUS

## 2020-04-02 NOTE — Evaluation (Signed)
Physical Therapy Evaluation Patient Details Name: Randy Shepherd MRN: [id number] DOB: February 16, 1951 Today's Date: 04/02/2020   History of Present Illness  69 y.o. male with medical history significant of HTN, prior stroke with residual aphasia, R ankle ORIF October 2021 with CAM boot, and presents with hematuria and difficulty wih foley catheter.  Clinical Impression  Pt admitted with above diagnosis. PTA, pt living in car with spouse, using RW in community, difficulty donning CAM boot so doesn't always use it and puts weight on RLE. Pt slightly impulsive with mobility and slightly unsteady gait without overt LOB, no foot drop noted. Pt reports feeling like he is close to baseline with mobility. Pt currently with functional limitations due to the deficits listed below (see PT Problem List). Pt will benefit from skilled PT to increase their independence and safety with mobility to allow discharge to the venue listed below.       Follow Up Recommendations No PT follow up    Equipment Recommendations  None recommended by PT    Recommendations for Other Services       Precautions / Restrictions Precautions Precautions: Fall Required Braces or Orthoses: Other Brace Other Brace: R CAM boot Restrictions Weight Bearing Restrictions: Yes (RLE NWB ~8 wks from 02/16/20) Other Position/Activity Restrictions: pt states he is allowed to WB on RLE now      Mobility  Bed Mobility Overal bed mobility: Modified Independent    General bed mobility comments: slightly increased time to come to sitting EOB    Transfers Overall transfer level: Needs assistance Equipment used: Rolling walker (2 wheeled) Transfers: Sit to/from Stand Sit to Stand: Supervision  General transfer comment: BUE assisting to power up from seated EOB  Ambulation/Gait Ambulation/Gait assistance: Supervision Gait Distance (Feet): 150 Feet Assistive device: Rolling walker (2 wheeled) Gait Pattern/deviations:  Step-through pattern Gait velocity: WFL   General Gait Details: pt slightly impulsive with turns, cued for maintaining body close to RW, step through pattern with equal step length, slightly unsteady without LOB  Stairs            Wheelchair Mobility    Modified Rankin (Stroke Patients Only)       Balance Overall balance assessment: History of Falls;Mild deficits observed, not formally tested        Pertinent Vitals/Pain Pain Assessment: 0-10 Pain Score: 2  Pain Location: R ankle Pain Descriptors / Indicators: Sore Pain Intervention(s): Limited activity within patient's tolerance;Monitored during session    Home Living Family/patient expects to be discharged to:: Shelter/Homeless  Additional Comments: Pt has RW, living in car with spouse.    Prior Function Level of Independence: Needs assistance   Gait / Transfers Assistance Needed: Pt reports ambulating with RW and CAM boot (when he can get it on), puts weight on R foot, still unable to elevate R foot due to living in car.  ADL's / Homemaking Assistance Needed: Pt reports wife assists with bathing and dressing        Hand Dominance        Extremity/Trunk Assessment   Upper Extremity Assessment Upper Extremity Assessment: Overall WFL for tasks assessed    Lower Extremity Assessment Lower Extremity Assessment: Overall WFL for tasks assessed (AROM BLE WNL throughout, RLE 4+/5, LLE 4+/5 except L hip flexion 3+/5, denies numbness/tingling throughout BLE)    Cervical / Trunk Assessment Cervical / Trunk Assessment: Normal  Communication   Communication: Other (comment) (low volume, slurred speech from previous CVA)  Cognition Arousal/Alertness: Awake/alert Behavior During Therapy: [facility]  for tasks assessed/performed Overall Cognitive Status: Within Functional Limits for tasks assessed           General Comments      Exercises     Assessment/Plan    PT Assessment Patient needs continued PT services   PT Problem List Decreased activity tolerance;Decreased knowledge of use of DME;Pain;Decreased safety awareness       PT Treatment Interventions DME instruction;Gait training;Functional mobility training;Therapeutic activities;Therapeutic exercise;Balance training;Neuromuscular re-education;Patient/family education    PT Goals (Current goals can be found in the Care Plan section)  Acute Rehab PT Goals Patient Stated Goal: none stated PT Goal Formulation: With patient Time For Goal Achievement: 04/16/20 Potential to Achieve Goals: Good    Frequency Min 3X/week   Barriers to discharge        Co-evaluation               AM-PAC PT "6 Clicks" Mobility  Outcome Measure Help needed turning from your back to your side while in a flat bed without using bedrails?: None Help needed moving from lying on your back to sitting on the side of a flat bed without using bedrails?: None Help needed moving to and from a bed to a chair (including a wheelchair)?: None Help needed standing up from a chair using your arms (e.g., wheelchair or bedside chair)?: None Help needed to walk in hospital room?: None Help needed climbing 3-5 steps with a railing? : A Little 6 Click Score: 23    End of Session Equipment Utilized During Treatment: Gait belt Activity Tolerance: Patient tolerated treatment well Patient left: in bed;with call bell/phone within reach Nurse Communication: Mobility status PT Visit Diagnosis: Other abnormalities of gait and mobility (R26.89)    Time: 1540-0867 PT Time Calculation (min) (ACUTE ONLY): 22 min   Charges:   PT Evaluation $PT Eval Low Complexity: 1 Low           Tori Deondrea Markos PT, DPT 04/02/20, 11:35 AM

## 2020-04-02 NOTE — Consult Note (Addendum)
Subjective: CC: Urinary retention   Dr. Aline August requested the consult.   Hx: Randy Shepherd is a 69 yo homeless man who was admittent in October with an ankle fracture and was found to be in retention.  He was managed with a foley and outpatient urology f/u was recommended but he failed to follow up for that.   He was seen in the ER last month with catheter issues and was admitted on 03/31/20 with hematuria and a UTI.   His culture is pending at this time.  He had a CT stone study that showed some hyperdense material at the left bladder base adjacent to the foley and consideration was given for old blood but a partially calcified neoplasm is also a possibility.   He is currently on tamsulosin but has not to my knowledge had a voiding trial.   He is a former smoker who quit in 1990.  He has no other GU history.   He had [employer] on a culture on 11/10 in the ER.  ROS:  Review of Systems  Neurological: Positive for speech change.       Balance issues  All other systems reviewed and are negative.   No Known Allergies  Past Medical History:  Diagnosis Date  . Abscess 10/07/2012   RT KNEE  . Degenerative disc disease   . Depression   . DJD (degenerative joint disease)   . High cholesterol   . Hx of adenomatous colonic polyps 09/26/2017  . Hypertension   . Hypothyroidism   . Stroke [facility])     Past Surgical History:  Procedure Laterality Date  . APPENDECTOMY    . COLONOSCOPY    . IR ANGIO INTRA EXTRACRAN SEL COM CAROTID INNOMINATE BILAT MOD SED  04/11/2017  . IR ANGIO VERTEBRAL SEL SUBCLAVIAN INNOMINATE UNI R MOD SED  04/11/2017  . IR ANGIO VERTEBRAL SEL VERTEBRAL UNI L MOD SED  04/11/2017  . IR RADIOLOGIST EVAL & MGMT  05/23/2017  . LOOP RECORDER INSERTION N/A 04/13/2017   Procedure: LOOP RECORDER INSERTION;  Surgeon: Thompson Grayer, MD;  Location: [facility];  Service: Cardiovascular;  Laterality: N/A;  . lpop recorder    . ORIF ANKLE FRACTURE Right 02/16/2020    Procedure: OPEN REDUCTION INTERNAL FIXATION (ORIF) ANKLE FRACTURE;  Surgeon: Nicholes Stairs, MD;  Location: [facility];  Service: Orthopedics;  Laterality: Right;  90 mins  . RADIOLOGY WITH ANESTHESIA N/A 04/11/2017   Procedure: IR WITH ANESTHESIA;  Surgeon: Luanne Bras, MD;  Location: [facility];  Service: Radiology;  Laterality: N/A;    Social History   Socioeconomic History  . Marital status: Married    Spouse name: Not on file  . Number of children: 3  . Years of education: Not on file  . Highest education level: Not on file  Occupational History  . Occupation: Geophysicist/field seismologist  Tobacco Use  . Smoking status: Former Smoker    Quit date: 1990    Years since quitting: 31.9  . Smokeless tobacco: Never Used  Vaping Use  . Vaping Use: Never used  Substance and Sexual Activity  . Alcohol use: Not Currently    Alcohol/week: 3.0 standard drinks    Types: 3 Cans of beer per week    Comment: DAILY BEER  . Drug use: No  . Sexual activity: Not Currently  Other Topics Concern  . Not on file  Social History Narrative  . Not on file   Social Determinants of Health   Financial  Resource Strain:   . Difficulty of Paying Living Expenses: Not on file  Food Insecurity:   . Worried About Charity fundraiser in the Last Year: Not on file  . Ran Out of Food in the Last Year: Not on file  Transportation Needs:   . Lack of Transportation (Medical): Not on file  . Lack of Transportation (Non-Medical): Not on file  Physical Activity:   . Days of Exercise per Week: Not on file  . Minutes of Exercise per Session: Not on file  Stress:   . Feeling of Stress : Not on file  Social Connections:   . Frequency of Communication with Friends and Family: Not on file  . Frequency of Social Gatherings with Friends and Family: Not on file  . Attends Religious Services: Not on file  . Active Member of Clubs or Organizations: Not on file  . Attends Archivist Meetings: Not on file  . Marital  Status: Not on file  Intimate Partner Violence:   . Fear of Current or Ex-Partner: Not on file  . Emotionally Abused: Not on file  . Physically Abused: Not on file  . Sexually Abused: Not on file    Family History  Problem Relation Age of Onset  . Cancer Mother        type unknown  . Heart disease Brother   . Throat cancer Brother   . Heart disease Brother     Anti-infectives: Anti-infectives (From admission, onward)   Start     Dose/Rate Route Frequency Ordered Stop   04/02/20 0800  ceFEPIme (MAXIPIME) 2 g in sodium chloride 0.9 % 100 mL IVPB  Status:  Discontinued        2 g 200 mL/hr over 30 Minutes Intravenous Every 12 hours 04/02/20 0715 04/02/20 0722   04/02/20 0800  cefTRIAXone (ROCEPHIN) 1 g in sodium chloride 0.9 % 100 mL IVPB        1 g 200 mL/hr over 30 Minutes Intravenous Every 24 hours 04/02/20 0722     04/01/20 0800  ceFEPIme (MAXIPIME) 2 g in sodium chloride 0.9 % 100 mL IVPB  Status:  Discontinued        2 g 200 mL/hr over 30 Minutes Intravenous Every 24 hours 04/01/20 0109 04/02/20 0715   04/01/20 0015  aztreonam (AZACTAM) 2 g in sodium chloride 0.9 % 100 mL IVPB        2 g 200 mL/hr over 30 Minutes Intravenous  Once 04/01/20 0000 04/01/20 0114      Current Facility-Administered Medications  Medication Dose Route Frequency Provider Last Rate Last Admin  . acetaminophen (TYLENOL) tablet 650 mg  650 mg Oral Q6H PRN Etta Quill, DO       Or  . acetaminophen (TYLENOL) suppository 650 mg  650 mg Rectal Q6H PRN Etta Quill, DO      . amLODipine (NORVASC) tablet 10 mg  10 mg Oral Daily Alekh, Kshitiz, MD      . aspirin EC tablet 81 mg  81 mg Oral Daily Starla Link, Kshitiz, MD   81 mg at 04/02/20 0949  . cefTRIAXone (ROCEPHIN) 1 g in sodium chloride 0.9 % 100 mL IVPB  1 g Intravenous Q24H Alekh, Kshitiz, MD 200 mL/hr at 04/02/20 0825 1 g at 04/02/20 0825  . Chlorhexidine Gluconate Cloth 2 % PADS 6 each  6 each Topical Daily Aline August, MD   6 each at  04/02/20 1021  . clopidogrel (PLAVIX) tablet 75 mg  75 mg Oral Daily Aline August, MD   75 mg at 04/02/20 0949  . levothyroxine (SYNTHROID) tablet 176 mcg  176 mcg Oral Q0600 Aline August, MD   176 mcg at 04/02/20 0536  . ondansetron (ZOFRAN) tablet 4 mg  4 mg Oral Q6H PRN Etta Quill, DO       Or  . ondansetron [facility]) injection 4 mg  4 mg Intravenous Q6H PRN Etta Quill, DO      . potassium chloride SA (KLOR-CON) CR tablet 40 mEq  40 mEq Oral Q4H Aline August, MD   40 mEq at 04/02/20 0823  . tamsulosin (FLOMAX) capsule 0.4 mg  0.4 mg Oral QPC supper Aline August, MD   0.4 mg at 04/01/20 1659     Objective: Vital signs in last 24 hours: BP (!) 148/83 (BP Location: Left Arm)   Pulse 70   Temp 98 F (36.7 C)   Resp 18   Ht 5\' 8"  (1.727 m)   Wt 90.7 kg   SpO2 98%   BMI 30.41 kg/m   Intake/Output from previous day: 12/02 0701 - 12/03 0700 In: 2112.3 [P.O.:1080; I.V.:932.3; IV Piggyback:100] Out: 3200 [Urine:3200] Intake/Output this shift: Total I/O In: 480 [P.O.:480] Out: 400 [Urine:400]   Physical Exam Vitals reviewed.  Constitutional:      Appearance: He is normal weight.     Comments: Disheveled.  HENT:     Head: Normocephalic and atraumatic.  Cardiovascular:     Rate and Rhythm: Normal rate and regular rhythm.  Pulmonary:     Effort: Pulmonary effort is normal. No respiratory distress.     Breath sounds: Normal breath sounds.  Abdominal:     General: Abdomen is flat.     Palpations: Abdomen is soft.  Genitourinary:    Comments: Foley indwelling at the meatus. AP without lesions. NST without mass. Prostate 2+ benign without nodules. SV non-palpable.  Musculoskeletal:        General: No swelling or tenderness. Normal range of motion.     Cervical back: Normal range of motion and neck supple.  Skin:    General: Skin is warm and dry.  Neurological:     General: No focal deficit present.     Mental Status: He is alert and oriented to  person, place, and time.     Comments: Mildly slowed speech.  Psychiatric:        Mood and Affect: Mood normal.        Behavior: Behavior normal.     Lab Results:  Results for orders placed or performed during the hospital encounter of 03/31/20 (from the past 24 hour(s))  CBC with Differential/Platelet     Status: Abnormal   Collection Time: 04/02/20  5:05 AM  Result Value Ref Range   WBC 13.9 (H) 4.0 - 10.5 K/uL   RBC 4.44 4.22 - 5.81 MIL/uL   Hemoglobin 13.2 13.0 - 17.0 g/dL   HCT 39.1 39 - 52 %   MCV 88.1 80.0 - 100.0 fL   MCH 29.7 26.0 - 34.0 pg   MCHC 33.8 30.0 - 36.0 g/dL   RDW 14.2 11.5 - 15.5 %   Platelets 240 150 - 400 K/uL   nRBC 0.0 0.0 - 0.2 %   Neutrophils Relative % 66 %   Neutro Abs 9.1 (H) 1.7 - 7.7 K/uL   Lymphocytes Relative 20 %   Lymphs Abs 2.8 0.7 - 4.0 K/uL   Monocytes Relative 10 %   Monocytes Absolute 1.4 (  H) 0.1 - 1.0 K/uL   Eosinophils Relative 2 %   Eosinophils Absolute 0.2 0.0 - 0.5 K/uL   Basophils Relative 1 %   Basophils Absolute 0.1 0.0 - 0.1 K/uL   Immature Granulocytes 1 %   Abs Immature Granulocytes 0.13 (H) 0.00 - 0.07 K/uL  Basic metabolic panel     Status: Abnormal   Collection Time: 04/02/20  5:05 AM  Result Value Ref Range   Sodium 137 135 - 145 mmol/L   Potassium 3.3 (L) 3.5 - 5.1 mmol/L   Chloride 104 98 - 111 mmol/L   CO2 21 (L) 22 - 32 mmol/L   Glucose, Bld 101 (H) 70 - 99 mg/dL   BUN 17 8 - 23 mg/dL   Creatinine, Ser 0.86 0.61 - 1.24 mg/dL   Calcium 8.8 (L) 8.9 - 10.3 mg/dL   GFR, Estimated >60 >60 mL/min   Anion gap 12 5 - 15  Magnesium     Status: None   Collection Time: 04/02/20  5:05 AM  Result Value Ref Range   Magnesium 2.0 1.7 - 2.4 mg/dL    BMET Recent Labs    04/01/20 0512 04/02/20 0505  NA 138 137  K 2.9* 3.3*  CL 105 104  CO2 18* 21*  GLUCOSE 107* 101*  BUN 43* 17  CREATININE 1.50* 0.86  CALCIUM 9.3 8.8*   PT/INR No results for input(s): LABPROT, INR in the last 72 hours. ABG No results for  input(s): PHART, HCO3 in the last 72 hours.  Invalid input(s): PCO2, PO2  Studies/Results: CT Renal Stone Study  Result Date: 04/01/2020 CLINICAL DATA:  Acute renal failure EXAM: CT ABDOMEN AND PELVIS WITHOUT CONTRAST TECHNIQUE: Multidetector CT imaging of the abdomen and pelvis was performed following the standard protocol without IV contrast. COMPARISON:  None. FINDINGS: Lower chest: No acute abnormality. Hepatobiliary: No focal liver abnormality is seen. No gallstones, gallbladder wall thickening, or biliary dilatation. Pancreas: Unremarkable. No pancreatic ductal dilatation or surrounding inflammatory changes. Spleen: Normal in size without focal abnormality. Adrenals/Urinary Tract: Adrenal glands are within normal limits. The kidneys are well visualized bilaterally without renal calculi or obstructive changes. Ureters are within normal limits. Bladder is decompressed by Foley catheter. Some air is noted within the bladder. Additionally some hyperdense material is noted which likely represents thrombus given the clinical history. No contrast was administered for this exam. Stomach/Bowel: No obstructive or inflammatory changes of the colon are seen. The appendix has been surgically removed. No small bowel or gastric abnormality is noted. Vascular/Lymphatic: Aortic atherosclerosis. No enlarged abdominal or pelvic lymph nodes. Reproductive: Prostate is unremarkable. Other: No abdominal wall hernia or abnormality. No abdominopelvic ascites. Musculoskeletal: No acute or significant osseous findings. IMPRESSION: Foley catheter is well seated within the bladder. Some hyperdense material is noted which may represent thrombus given the hematuria. No obstructive changes of the collecting systems are seen. No other focal abnormality is noted. Electronically Signed   By: Inez Catalina M.D.   On: 04/01/2020 00:12   I have communicated with Dr. Starla Link and reviewed the pertinent notes, imaging and labs.  He culture is  pending.   Assessment/Plan: BPH with retention.  He is currently managed with a foley and is on tamsulosin.  He doesn't appear to have had a voiding trial yet but I would prefer that be delayed until the bladder lesion is clarified.   UTI with hematuria and possible bladder mass on CT stone study.  I think he needs a CT with IV contrast  and renal/bladder delays to better assess the lesion and if it is consistent with a neoplasm, he will need cystoscopy and a possible TURBT off of the plavix and ASA.             CC: Dr. Aline August.      Irine Seal 04/02/2020 [phone]

## 2020-04-02 NOTE — Progress Notes (Signed)
Patient ID: Randy Shepherd, male   DOB: [birth year], 69 y.o.   MRN: [id number]  PROGRESS NOTE    Randy Shepherd  NOM:[id number] DOB: July 03, 1950 DOA: 03/31/2020 PCP: [facility]   Brief Narrative:  69 year old male with history of hypertension, prior unspecified stroke with residual aphasia, urinary retention status post Foley catheter placement noncompliance, homelessness presented with hematuria and partially dislodged catheter.  On presentation UA was consistent with UTI and patient had acute kidney injury as well as leukocytosis.  New catheter was placed in the ED.   Assessment & Plan:   Catheter associated urinary tract infection Urinary retention status post indwelling Foley catheter Acute kidney injury Leukocytosis -Patient presented with hematuria and partially dislodged catheter. - UA was consistent with UTI and patient had acute kidney injury as well as leukocytosis.  New catheter was placed in the ED. -Treated with IV fluids.  Renal function has normalized.  WBC is improving.  Change cefepime to Rocephin.  Cultures negative so far.  DC IV fluids. -I have reached out to Dr. Jeffie Pollock via secure chat regarding this patient was supposed to follow-up with urology as an outpatient for catheter issues but did not do so because of lack of insurance.  he will evaluate the patient in consultation. -Flomax has already been started.  Hypertension -Blood pressure on the higher side.  Monitor.  History of prior stroke with aphasia -Aspirin and Plavix has been resumed. -PT eval.  Hypothyroidism-continue levothyroxine  Homelessness -Patient's whole family has been apparently living in a car.  Social work has been consulted.  Patient does not want to go to homeless shelter for now.    DVT prophylaxis: SCDs because of anterior Code Status: Full Family Communication: None at bedside Disposition Plan: Status is: Inpatient  Remains inpatient appropriate  because:Inpatient level of care appropriate due to severity of illness   Dispo: The patient is from: Home.  Patient is currently homeless and lives in a car              Anticipated d/c is to: Probably back to prior living situation because patient is refusing to go home to shelter              Anticipated d/c date is: 1 day              Patient currently is not medically stable to d/c.  Consultants: Requested urology eval  Procedures: Foley catheter were changed on presentation in the ED  Antimicrobials:  Anti-infectives (From admission, onward)   Start     Dose/Rate Route Frequency Ordered Stop   04/02/20 0800  ceFEPIme (MAXIPIME) 2 g in sodium chloride 0.9 % 100 mL IVPB  Status:  Discontinued        2 g 200 mL/hr over 30 Minutes Intravenous Every 12 hours 04/02/20 0715 04/02/20 0722   04/02/20 0800  cefTRIAXone (ROCEPHIN) 1 g in sodium chloride 0.9 % 100 mL IVPB        1 g 200 mL/hr over 30 Minutes Intravenous Every 24 hours 04/02/20 0722     04/01/20 0800  ceFEPIme (MAXIPIME) 2 g in sodium chloride 0.9 % 100 mL IVPB  Status:  Discontinued        2 g 200 mL/hr over 30 Minutes Intravenous Every 24 hours 04/01/20 0109 04/02/20 0715   04/01/20 0015  aztreonam (AZACTAM) 2 g in sodium chloride 0.9 % 100 mL IVPB        2 g 200 mL/hr over 30 Minutes  Intravenous  Once 04/01/20 0000 04/01/20 0114       Subjective: Patient seen and examined at bedside.  Poor historian.  Feels better.  Denies worsening abdominal pain, nausea, vomiting or fever.  Objective: Vitals:   04/01/20 0947 04/01/20 1433 04/01/20 2042 04/02/20 0531  BP: 120/70 (!) 169/92 (!) 142/81 (!) 148/83  Pulse: 85 86 78 70  Resp: 14 18 18 18   Temp: 97.9 F (36.6 C) 98.9 F (37.2 C) 98.2 F (36.8 C) 98 F (36.7 C)  TempSrc:  Oral    SpO2: 97% 97% 98% 98%  Weight:      Height:        Intake/Output Summary (Last 24 hours) at 04/02/2020 1058 Last data filed at 04/02/2020 1006 Gross per 24 hour  Intake 2006.03 ml   Output 2900 ml  Net -893.97 ml   Filed Weights   03/31/20 1637  Weight: 90.7 kg    Examination:  General exam: Appears calm and comfortable.  Looks chronically ill.  Poor historian Respiratory system: Bilateral decreased breath sounds at bases Cardiovascular system: S1 & S2 heard, Rate controlled Gastrointestinal system: Abdomen is nondistended, soft and nontender. Normal bowel sounds heard. Genitourinary: Indwelling catheter present Extremities: No cyanosis, clubbing, edema  Central nervous system: Alert and oriented.  Slow to respond.  Poor historian.  No focal neurological deficits. Moving extremities Skin: No rashes, lesions or ulcers Psychiatry: Flat affect.   Data Reviewed: I have personally reviewed following labs and imaging studies  CBC: Recent Labs  Lab 03/31/20 2143 04/01/20 0512 04/02/20 0505  WBC 21.8* 16.9* 13.9*  NEUTROABS 16.8*  --  9.1*  HGB 14.8 13.6 13.2  HCT 44.0 41.3 39.1  MCV 88.2 89.4 88.1  PLT 345 272 119   Basic Metabolic Panel: Recent Labs  Lab 03/31/20 2143 04/01/20 0512 04/02/20 0505  NA 138 138 137  K 3.5 2.9* 3.3*  CL 101 105 104  CO2 17* 18* 21*  GLUCOSE 94 107* 101*  BUN 49* 43* 17  CREATININE 2.22* 1.50* 0.86  CALCIUM 9.6 9.3 8.8*  MG  --  2.3 2.0   GFR: Estimated Creatinine Clearance: 89.9 mL/min (by C-G formula based on SCr of 0.86 mg/dL). Liver Function Tests: No results for input(s): AST, ALT, ALKPHOS, BILITOT, PROT, ALBUMIN in the last 168 hours. No results for input(s): LIPASE, AMYLASE in the last 168 hours. No results for input(s): AMMONIA in the last 168 hours. Coagulation Profile: No results for input(s): INR, PROTIME in the last 168 hours. Cardiac Enzymes: No results for input(s): CKTOTAL, CKMB, CKMBINDEX, TROPONINI in the last 168 hours. BNP (last 3 results) No results for input(s): PROBNP in the last 8760 hours. HbA1C: No results for input(s): HGBA1C in the last 72 hours. CBG: No results for input(s):  GLUCAP in the last 168 hours. Lipid Profile: No results for input(s): CHOL, HDL, LDLCALC, TRIG, CHOLHDL, LDLDIRECT in the last 72 hours. Thyroid Function Tests: No results for input(s): TSH, T4TOTAL, FREET4, T3FREE, THYROIDAB in the last 72 hours. Anemia Panel: No results for input(s): VITAMINB12, FOLATE, FERRITIN, TIBC, IRON, RETICCTPCT in the last 72 hours. Sepsis Labs: No results for input(s): PROCALCITON, LATICACIDVEN in the last 168 hours.  Recent Results (from the past 240 hour(s))  Urine culture     Status: Abnormal (Preliminary result)   Collection Time: 03/31/20 11:00 PM   Specimen: Urine, Clean Catch  Result Value Ref Range Status   Specimen Description   Final    URINE, CLEAN CATCH Performed at [facility]  [place] [address] [place], [place] [address]    Special Requests   Final    NONE Performed at [facility], [place] [address], [place], [place] [address]    Culture (A)  Final    >=100,000 COLONIES/mL GRAM NEGATIVE RODS SUSCEPTIBILITIES TO FOLLOW Performed at [facility], [place] [address], [place], [place] [address]    Report Status PENDING  Incomplete  Resp Panel by RT-PCR (Flu A&B, Covid) Nasopharyngeal Swab     Status: None   Collection Time: 04/01/20 12:15 AM   Specimen: Nasopharyngeal Swab; Nasopharyngeal(NP) swabs in vial transport medium  Result Value Ref Range Status   SARS Coronavirus 2 by RT PCR NEGATIVE NEGATIVE Final    Comment: (NOTE) SARS-CoV-2 target nucleic acids are NOT DETECTED.  The SARS-CoV-2 RNA is generally detectable in upper respiratory specimens during the acute phase of infection. The lowest concentration of SARS-CoV-2 viral copies this assay can detect is 138 copies/mL. A negative result does not preclude SARS-Cov-2 infection and should not be used as the sole basis for treatment or other patient management decisions. A negative result may occur with  improper specimen collection/handling,  submission of specimen other than nasopharyngeal swab, presence of viral mutation(s) within the areas targeted by this assay, and inadequate number of viral copies(<138 copies/mL). A negative result must be combined with clinical observations, patient history, and epidemiological information. The expected result is Negative.  Fact Sheet for Patients:  EntrepreneurPulse.com.au  Fact Sheet for Healthcare Providers:  IncredibleEmployment.be  This test is no t yet approved or cleared by the [place] FDA and  has been authorized for detection and/or diagnosis of SARS-CoV-2 by FDA under an Emergency Use Authorization (EUA). This EUA will remain  in effect (meaning this test can be used) for the duration of the COVID-19 declaration under Section 564(b)(1) of the Act, 21 U.S.C.section 360bbb-3(b)(1), unless the authorization is terminated  or revoked sooner.       Influenza A by PCR NEGATIVE NEGATIVE Final   Influenza B by PCR NEGATIVE NEGATIVE Final    Comment: (NOTE) The Xpert Xpress SARS-CoV-2/FLU/RSV plus assay is intended as an aid in the diagnosis of influenza from Nasopharyngeal swab specimens and should not be used as a sole basis for treatment. Nasal washings and aspirates are unacceptable for Xpert Xpress SARS-CoV-2/FLU/RSV testing.  Fact Sheet for Patients: EntrepreneurPulse.com.au  Fact Sheet for Healthcare Providers: IncredibleEmployment.be  This test is not yet approved or cleared by the [place] FDA and has been authorized for detection and/or diagnosis of SARS-CoV-2 by FDA under an Emergency Use Authorization (EUA). This EUA will remain in effect (meaning this test can be used) for the duration of the COVID-19 declaration under Section 564(b)(1) of the Act, 21 U.S.C. section 360bbb-3(b)(1), unless the authorization is terminated or revoked.  Performed at [facility], [place] [address], [place], [place] [address]          Radiology Studies: CT Renal Stone Study  Result Date: 04/01/2020 CLINICAL DATA:  Acute renal failure EXAM: CT ABDOMEN AND PELVIS WITHOUT CONTRAST TECHNIQUE: Multidetector CT imaging of the abdomen and pelvis was performed following the standard protocol without IV contrast. COMPARISON:  None. FINDINGS: Lower chest: No acute abnormality. Hepatobiliary: No focal liver abnormality is seen. No gallstones, gallbladder wall thickening, or biliary dilatation. Pancreas: Unremarkable. No pancreatic ductal dilatation or surrounding inflammatory changes. Spleen: Normal in size without focal abnormality. Adrenals/Urinary Tract: Adrenal glands are within normal limits. The kidneys  are well visualized bilaterally without renal calculi or obstructive changes. Ureters are within normal limits. Bladder is decompressed by Foley catheter. Some air is noted within the bladder. Additionally some hyperdense material is noted which likely represents thrombus given the clinical history. No contrast was administered for this exam. Stomach/Bowel: No obstructive or inflammatory changes of the colon are seen. The appendix has been surgically removed. No small bowel or gastric abnormality is noted. Vascular/Lymphatic: Aortic atherosclerosis. No enlarged abdominal or pelvic lymph nodes. Reproductive: Prostate is unremarkable. Other: No abdominal wall hernia or abnormality. No abdominopelvic ascites. Musculoskeletal: No acute or significant osseous findings. IMPRESSION: Foley catheter is well seated within the bladder. Some hyperdense material is noted which may represent thrombus given the hematuria. No obstructive changes of the collecting systems are seen. No other focal abnormality is noted. Electronically Signed   By: Inez Catalina M.D.   On: 04/01/2020 00:12        Scheduled Meds: . aspirin EC  81 mg Oral Daily  . Chlorhexidine Gluconate Cloth  6 each Topical  Daily  . clopidogrel  75 mg Oral Daily  . levothyroxine  176 mcg Oral Q0600  . potassium chloride  40 mEq Oral Q4H  . tamsulosin  0.4 mg Oral QPC supper   Continuous Infusions: . cefTRIAXone (ROCEPHIN)  IV 1 g (04/02/20 0825)          Aline August, MD [facility] 04/02/2020, 10:58 AM

## 2020-04-02 NOTE — Progress Notes (Signed)
Pharmacy Antibiotic Note  Randy Shepherd is a 69 y.o. male with urinary cath foley cath presented to the ED on 03/31/2020 with urinary retention. A new foley cath placed in the ED.  He was started on cefepime on admission for empiric coverage for suspected UTI.  Today, 04/02/2020: - afeb, wbc elevated but trending down - scr trending down (0.86, crcl~90) - urine culture with this adm pending  Plan: - increase cefepime to 2gm IV q12h for renal function and UTI indication _____________________________________  Height: 5\' 8"  (172.7 cm) Weight: 90.7 kg (200 lb) IBW/kg (Calculated) : 68.4  Temp (24hrs), Avg:98.3 F (36.8 C), Min:97.9 F (36.6 C), Max:98.9 F (37.2 C)  Recent Labs  Lab 03/31/20 2143 04/01/20 0512 04/02/20 0505  WBC 21.8* 16.9* 13.9*  CREATININE 2.22* 1.50* 0.86    Estimated Creatinine Clearance: 89.9 mL/min (by C-G formula based on SCr of 0.86 mg/dL).    No Known Allergies  Antimicrobials this admission: 12/2 aztreonam x 1 12/2 cefepime >>    Microbiology results: 11/10 ucx: >100K providencia rettgeri (R= amp, ancef, nitrof; I= unasyn; S=cef, CTX, cipro, gent, imi, zosyn, bactim) FINAL  12/1  UCx:     Thank you for allowing pharmacy to be a part of this patient's care.  Lynelle Doctor 04/02/2020 6:56 AM

## 2020-04-03 DIAGNOSIS — Z8673 Personal history of transient ischemic attack (TIA), and cerebral infarction without residual deficits: Secondary | ICD-10-CM | POA: Diagnosis not present

## 2020-04-03 DIAGNOSIS — N390 Urinary tract infection, site not specified: Secondary | ICD-10-CM | POA: Diagnosis not present

## 2020-04-03 DIAGNOSIS — R339 Retention of urine, unspecified: Secondary | ICD-10-CM

## 2020-04-03 DIAGNOSIS — N179 Acute kidney failure, unspecified: Secondary | ICD-10-CM | POA: Diagnosis not present

## 2020-04-03 DIAGNOSIS — T83511A Infection and inflammatory reaction due to indwelling urethral catheter, initial encounter: Secondary | ICD-10-CM | POA: Diagnosis not present

## 2020-04-03 LAB — CBC WITH DIFFERENTIAL/PLATELET
Abs Immature Granulocytes: 0.17 10*3/uL — ABNORMAL HIGH (ref 0.00–0.07)
Basophils Absolute: 0.1 10*3/uL (ref 0.0–0.1)
Basophils Relative: 1 %
Eosinophils Absolute: 0.7 10*3/uL — ABNORMAL HIGH (ref 0.0–0.5)
Eosinophils Relative: 5 %
HCT: 40.7 % (ref 39.0–52.0)
Hemoglobin: 13.7 g/dL (ref 13.0–17.0)
Immature Granulocytes: 1 %
Lymphocytes Relative: 19 %
Lymphs Abs: 2.4 10*3/uL (ref 0.7–4.0)
MCH: 29.6 pg (ref 26.0–34.0)
MCHC: 33.7 g/dL (ref 30.0–36.0)
MCV: 87.9 fL (ref 80.0–100.0)
Monocytes Absolute: 0.9 10*3/uL (ref 0.1–1.0)
Monocytes Relative: 7 %
Neutro Abs: 8.6 10*3/uL — ABNORMAL HIGH (ref 1.7–7.7)
Neutrophils Relative %: 67 %
Platelets: 251 10*3/uL (ref 150–400)
RBC: 4.63 MIL/uL (ref 4.22–5.81)
RDW: 14.1 % (ref 11.5–15.5)
WBC: 12.9 10*3/uL — ABNORMAL HIGH (ref 4.0–10.5)
nRBC: 0 % (ref 0.0–0.2)

## 2020-04-03 LAB — BASIC METABOLIC PANEL
Anion gap: 15 (ref 5–15)
BUN: 16 mg/dL (ref 8–23)
CO2: 22 mmol/L (ref 22–32)
Calcium: 8.9 mg/dL (ref 8.9–10.3)
Chloride: 97 mmol/L — ABNORMAL LOW (ref 98–111)
Creatinine, Ser: 0.9 mg/dL (ref 0.61–1.24)
GFR, Estimated: >60 mL/min (ref >60)
Glucose, Bld: 179 mg/dL — ABNORMAL HIGH (ref 70–99)
Potassium: 3.2 mmol/L — ABNORMAL LOW (ref 3.5–5.1)
Sodium: 134 mmol/L — ABNORMAL LOW (ref 135–145)

## 2020-04-03 LAB — URINE CULTURE: Culture: >=100000 — AB

## 2020-04-03 LAB — MAGNESIUM: Magnesium: 1.9 mg/dL (ref 1.7–2.4)

## 2020-04-03 NOTE — Procedures (Addendum)
Procedure: Cystourethroscopy  Surgeon: Dr. Irine Seal  Assistant: Dr. Ermelinda Das  Diagnosis: Hematuria, bladder thickening  Procedure: After consent was obtained and after the patient was prepped and draped in the usual sterile fashion, flexible cystourethroscopy was performed.  This revealed a normal urethra. Prostate with bilobar hypertrophy. Systematic examination of the bladder revealed diffuse inflammation and moderate amount of shell-like calcified debris, likely from longstanding indwelling catheter. Bilateral UOs identified and in normal anatomic position. No evidence for tumors, stones, or other mucosal pathology. The cystoscope was removed and the bladder was kept full for TOV.   [facility]

## 2020-04-03 NOTE — Progress Notes (Signed)
Patient voided by self, 127ml of cloudy amber urine. Bladder scan complete, residual urine 154ml

## 2020-04-03 NOTE — Progress Notes (Signed)
Patient ID: Randy Shepherd, male   DOB: [birth year], 69 y.o.   MRN: [id number]  PROGRESS NOTE    Randy Shepherd  EQA:[id number] DOB: [birth year] DOA: 03/31/2020 PCP: [facility]   Brief Narrative:  69 year old male with history of hypertension, prior unspecified stroke with residual aphasia, urinary retention status post Foley catheter placement noncompliance, homelessness presented with hematuria and partially dislodged catheter.  On presentation UA was consistent with UTI and patient had acute kidney injury as well as leukocytosis.  New catheter was placed in the ED. urology was consulted.  Assessment & Plan:   Catheter associated urinary tract infection Urinary retention status post indwelling Foley catheter Acute kidney injury Leukocytosis -Patient presented with hematuria and partially dislodged catheter. - UA was consistent with UTI and patient had acute kidney injury as well as leukocytosis.  New catheter was placed in the ED. -Treated with IV fluids.  Renal function has normalized.  WBC is improving.  Urine culture is growing Providencia rettgeri.  Currently on Rocephin. -Continue Flomax -CT of the abdomen and pelvis with contrast was done yesterday as per urology recommendations which showed thickened bladder wall.  Follow further urology recommendations  Hypertension -Blood pressure improving.  Continue amlodipine  History of prior stroke with aphasia -Aspirin and Plavix have been resumed but were held again on 04/02/2020 as patient might need neurological intervention. -Tolerated PT eval.  Hypothyroidism-continue levothyroxine  Homelessness -Patient's whole family has been apparently living in a car.  Social work has been consulted.  Patient does not want to go to homeless shelter for now.    DVT prophylaxis: SCDs because of anterior Code Status: Full Family Communication: Wife at bedside Disposition Plan: Status is: Inpatient  Remains inpatient  appropriate because:Inpatient level of care appropriate due to severity of illness   Dispo: The patient is from: Home.  Patient is currently homeless and lives in a car              Anticipated d/c is to: Probably back to prior living situation because patient is refusing to go home to shelter              Anticipated d/c date is: 1 day              Patient currently is not medically stable to d/c.  Consultants: Urology  Procedures: Foley catheter were changed on presentation in the ED  Antimicrobials:  Anti-infectives (From admission, onward)   Start     Dose/Rate Route Frequency Ordered Stop   04/02/20 0800  ceFEPIme (MAXIPIME) 2 g in sodium chloride 0.9 % 100 mL IVPB  Status:  Discontinued        2 g 200 mL/hr over 30 Minutes Intravenous Every 12 hours 04/02/20 0715 04/02/20 0722   04/02/20 0800  cefTRIAXone (ROCEPHIN) 1 g in sodium chloride 0.9 % 100 mL IVPB        1 g 200 mL/hr over 30 Minutes Intravenous Every 24 hours 04/02/20 0722     04/01/20 0800  ceFEPIme (MAXIPIME) 2 g in sodium chloride 0.9 % 100 mL IVPB  Status:  Discontinued        2 g 200 mL/hr over 30 Minutes Intravenous Every 24 hours 04/01/20 0109 04/02/20 0715   04/01/20 0015  aztreonam (AZACTAM) 2 g in sodium chloride 0.9 % 100 mL IVPB        2 g 200 mL/hr over 30 Minutes Intravenous  Once 04/01/20 0000 04/01/20 0114  Subjective: Patient seen and examined at bedside.  Poor historian.  Feels better.  Wife present at bedside.  Patient denies worsening shortness of breath, abdominal pain, nausea or vomiting.  Had some penile burning yesterday. Objective: Vitals:   04/02/20 1306 04/02/20 1306 04/02/20 2059 04/03/20 0457  BP: (!) 143/94 (!) 143/94 (!) 143/79 138/73  Pulse:  88 73 68  Resp:  18 18 17   Temp:  97.7 F (36.5 C) 98 F (36.7 C) 97.6 F (36.4 C)  TempSrc:  Oral    SpO2:  97% 99% 99%  Weight:      Height:        Intake/Output Summary (Last 24 hours) at 04/03/2020 1040 Last data filed at  04/03/2020 0457 Gross per 24 hour  Intake 480 ml  Output 1425 ml  Net -945 ml   Filed Weights   03/31/20 1637  Weight: 90.7 kg    Examination:  General exam: No distress.  Poor historian.  Chronically ill looking. Respiratory system: Decreased breath sounds at bases bilaterally, no wheezing cardiovascular system: Rate controlled, S1-S2 heard Gastrointestinal system: Abdomen is nondistended, soft and nontender.  Bowel sounds are heard  genitourinary: Indwelling Foley catheter present with no hematuria Extremities: No clubbing, edema Central nervous system: Awake and alert.  Still slow to respond.  Poor historian.  No focal neurological deficits.  Moves extremities  skin: No obvious lesions/petechiae Psychiatry: Extremely flat affect   Data Reviewed: I have personally reviewed following labs and imaging studies  CBC: Recent Labs  Lab 03/31/20 2143 04/01/20 0512 04/02/20 0505 04/03/20 0856  WBC 21.8* 16.9* 13.9* 12.9*  NEUTROABS 16.8*  --  9.1* 8.6*  HGB 14.8 13.6 13.2 13.7  HCT 44.0 41.3 39.1 40.7  MCV 88.2 89.4 88.1 87.9  PLT 345 272 240 619   Basic Metabolic Panel: Recent Labs  Lab 03/31/20 2143 04/01/20 0512 04/02/20 0505 04/03/20 0856  NA 138 138 137 134*  K 3.5 2.9* 3.3* 3.2*  CL 101 105 104 97*  CO2 17* 18* 21* 22  GLUCOSE 94 107* 101* 179*  BUN 49* 43* 17 16  CREATININE 2.22* 1.50* 0.86 0.90  CALCIUM 9.6 9.3 8.8* 8.9  MG  --  2.3 2.0 1.9   GFR: Estimated Creatinine Clearance: 85.9 mL/min (by C-G formula based on SCr of 0.9 mg/dL). Liver Function Tests: No results for input(s): AST, ALT, ALKPHOS, BILITOT, PROT, ALBUMIN in the last 168 hours. No results for input(s): LIPASE, AMYLASE in the last 168 hours. No results for input(s): AMMONIA in the last 168 hours. Coagulation Profile: No results for input(s): INR, PROTIME in the last 168 hours. Cardiac Enzymes: No results for input(s): CKTOTAL, CKMB, CKMBINDEX, TROPONINI in the last 168 hours. BNP (last  3 results) No results for input(s): PROBNP in the last 8760 hours. HbA1C: No results for input(s): HGBA1C in the last 72 hours. CBG: No results for input(s): GLUCAP in the last 168 hours. Lipid Profile: No results for input(s): CHOL, HDL, LDLCALC, TRIG, CHOLHDL, LDLDIRECT in the last 72 hours. Thyroid Function Tests: No results for input(s): TSH, T4TOTAL, FREET4, T3FREE, THYROIDAB in the last 72 hours. Anemia Panel: No results for input(s): VITAMINB12, FOLATE, FERRITIN, TIBC, IRON, RETICCTPCT in the last 72 hours. Sepsis Labs: No results for input(s): PROCALCITON, LATICACIDVEN in the last 168 hours.  Recent Results (from the past 240 hour(s))  Urine culture     Status: Abnormal   Collection Time: 03/31/20 11:00 PM   Specimen: Urine, Clean Catch  Result Value Ref  Range Status   Specimen Description   Final    URINE, CLEAN CATCH Performed at [facility], [place] [address] [place], [place] [address]    Special Requests   Final    NONE Performed at [facility], [place] [address] [place], [place] [address]    Culture >=100,000 COLONIES/mL PROVIDENCIA RETTGERI (A)  Final   Report Status 04/03/2020 FINAL  Final   Organism ID, Bacteria PROVIDENCIA RETTGERI (A)  Final      Susceptibility   Providencia rettgeri - MIC*    AMPICILLIN RESISTANT Resistant     CEFAZOLIN >=64 RESISTANT Resistant     CEFEPIME 1 SENSITIVE Sensitive     CEFTRIAXONE <=0.25 SENSITIVE Sensitive     CIPROFLOXACIN <=0.25 SENSITIVE Sensitive     GENTAMICIN <=1 SENSITIVE Sensitive     IMIPENEM 2 SENSITIVE Sensitive     NITROFURANTOIN 128 RESISTANT Resistant     TRIMETH/SULFA <=20 SENSITIVE Sensitive     AMPICILLIN/SULBACTAM >=32 RESISTANT Resistant     PIP/TAZO <=4 SENSITIVE Sensitive     * >=100,000 COLONIES/mL PROVIDENCIA RETTGERI  Resp Panel by RT-PCR (Flu A&B, Covid) Nasopharyngeal Swab     Status: None   Collection Time: 04/01/20 12:15 AM   Specimen: Nasopharyngeal Swab;  Nasopharyngeal(NP) swabs in vial transport medium  Result Value Ref Range Status   SARS Coronavirus 2 by RT PCR NEGATIVE NEGATIVE Final    Comment: (NOTE) SARS-CoV-2 target nucleic acids are NOT DETECTED.  The SARS-CoV-2 RNA is generally detectable in upper respiratory specimens during the acute phase of infection. The lowest concentration of SARS-CoV-2 viral copies this assay can detect is 138 copies/mL. A negative result does not preclude SARS-Cov-2 infection and should not be used as the sole basis for treatment or other patient management decisions. A negative result may occur with  improper specimen collection/handling, submission of specimen other than nasopharyngeal swab, presence of viral mutation(s) within the areas targeted by this assay, and inadequate number of viral copies(<138 copies/mL). A negative result must be combined with clinical observations, patient history, and epidemiological information. The expected result is Negative.  Fact Sheet for Patients:  EntrepreneurPulse.com.au  Fact Sheet for Healthcare Providers:  IncredibleEmployment.be  This test is no t yet approved or cleared by the [place] FDA and  has been authorized for detection and/or diagnosis of SARS-CoV-2 by FDA under an Emergency Use Authorization (EUA). This EUA will remain  in effect (meaning this test can be used) for the duration of the COVID-19 declaration under Section 564(b)(1) of the Act, 21 U.S.C.section 360bbb-3(b)(1), unless the authorization is terminated  or revoked sooner.       Influenza A by PCR NEGATIVE NEGATIVE Final   Influenza B by PCR NEGATIVE NEGATIVE Final    Comment: (NOTE) The Xpert Xpress SARS-CoV-2/FLU/RSV plus assay is intended as an aid in the diagnosis of influenza from Nasopharyngeal swab specimens and should not be used as a sole basis for treatment. Nasal washings and aspirates are unacceptable for Xpert Xpress  SARS-CoV-2/FLU/RSV testing.  Fact Sheet for Patients: EntrepreneurPulse.com.au  Fact Sheet for Healthcare Providers: IncredibleEmployment.be  This test is not yet approved or cleared by the [place] FDA and has been authorized for detection and/or diagnosis of SARS-CoV-2 by FDA under an Emergency Use Authorization (EUA). This EUA will remain in effect (meaning this test can be used) for the duration of the COVID-19 declaration under Section 564(b)(1) of the Act, 21 U.S.C. section 360bbb-3(b)(1), unless the authorization is terminated or revoked.  Performed at [facility], [place] [address] [place], [place] [address]          Radiology Studies: CT ABDOMEN PELVIS W CONTRAST  Result Date: 04/02/2020 CLINICAL DATA:  Patient unable to urinate.  Blood in urine. EXAM: CT ABDOMEN AND PELVIS WITH CONTRAST TECHNIQUE: Multidetector CT imaging of the abdomen and pelvis was performed using the standard protocol following bolus administration of intravenous contrast. CONTRAST:  182mL OMNIPAQUE IOHEXOL 300 MG/ML  SOLN COMPARISON:  March 31, 2020 FINDINGS: Lower chest: Three-vessel coronary artery disease. Mildly prominent paraesophageal lymph node measuring 9 mm, unchanged. The lower chest is otherwise normal. Hepatobiliary: No focal liver abnormality is seen. No gallstones, gallbladder wall thickening, or biliary dilatation. Pancreas: Unremarkable. No pancreatic ductal dilatation or surrounding inflammatory changes. Spleen: Normal in size without focal abnormality. Adrenals/Urinary Tract: Adrenal glands are normal. No renal stones or masses are identified. No hydronephrosis or perinephric stranding. The ureters are normal in caliber. On delayed imaging, no filling defects are seen in the upper renal collecting systems or ureters. The bladder is thick walled with mild adjacent stranding. There is a Foley catheter in the bladder. On initial  imaging, there appears to be enhancement of the bladder mucosa. High attenuation pulled posteriorly may represent blood products, also seen on the December 1st study. On delayed imaging, no discrete mass is seen in the bladder. Evaluation is limited as the bladder is poorly distended and there is a Foley catheter in place. Stomach/Bowel: The stomach, small bowel, and colon are normal. The appendix is not visualized but there is no evidence of appendicitis. Vascular/Lymphatic: Calcified atherosclerosis is seen in the nonaneurysmal aorta. No adenopathy seen in the abdomen or pelvis. Reproductive: Prostate is unremarkable. Other: No abdominal wall hernia or abnormality. No abdominopelvic ascites. Musculoskeletal: No acute or significant osseous findings. IMPRESSION: 1. The bladder is thick walled with adjacent stranding which could be infectious or inflammatory. There appears to be enhancement of the bladder mucosa on initial imaging which could also be due to hyperemia from infection or inflammation. Evaluation for a mass is limited due to a Foley catheter and relatively poor distention. However, no discrete filling defects are seen. High attenuation posteriorly in the bladder on initial imaging may represent blood products, also seen on the March 31, 2020 study, given history. 2. Three-vessel coronary artery disease. 3. Mildly prominent paraesophageal node measuring 9 mm, unchanged. This is nonspecific but could be reactive. Electronically Signed   By: Dorise Bullion III M.D   On: 04/02/2020 17:30        Scheduled Meds: . amLODipine  10 mg Oral Daily  . Chlorhexidine Gluconate Cloth  6 each Topical Daily  . levothyroxine  176 mcg Oral Q0600  . tamsulosin  0.4 mg Oral QPC supper   Continuous Infusions: . cefTRIAXone (ROCEPHIN)  IV 1 g (04/03/20 0827)          Aline August, MD [facility] 04/03/2020, 10:40 AM

## 2020-04-04 DIAGNOSIS — Z8673 Personal history of transient ischemic attack (TIA), and cerebral infarction without residual deficits: Secondary | ICD-10-CM | POA: Diagnosis not present

## 2020-04-04 DIAGNOSIS — N390 Urinary tract infection, site not specified: Secondary | ICD-10-CM | POA: Diagnosis not present

## 2020-04-04 DIAGNOSIS — N179 Acute kidney failure, unspecified: Secondary | ICD-10-CM | POA: Diagnosis not present

## 2020-04-04 DIAGNOSIS — T83511A Infection and inflammatory reaction due to indwelling urethral catheter, initial encounter: Secondary | ICD-10-CM | POA: Diagnosis not present

## 2020-04-04 DIAGNOSIS — R338 Other retention of urine: Secondary | ICD-10-CM

## 2020-04-04 LAB — CBC WITH DIFFERENTIAL/PLATELET
Abs Immature Granulocytes: 0.16 10*3/uL — ABNORMAL HIGH (ref 0.00–0.07)
Basophils Absolute: 0.1 10*3/uL (ref 0.0–0.1)
Basophils Relative: 1 %
Eosinophils Absolute: 0.8 10*3/uL — ABNORMAL HIGH (ref 0.0–0.5)
Eosinophils Relative: 6 %
HCT: 42.2 % (ref 39.0–52.0)
Hemoglobin: 14 g/dL (ref 13.0–17.0)
Immature Granulocytes: 1 %
Lymphocytes Relative: 23 %
Lymphs Abs: 3.1 10*3/uL (ref 0.7–4.0)
MCH: 29.1 pg (ref 26.0–34.0)
MCHC: 33.2 g/dL (ref 30.0–36.0)
MCV: 87.7 fL (ref 80.0–100.0)
Monocytes Absolute: 1 10*3/uL (ref 0.1–1.0)
Monocytes Relative: 7 %
Neutro Abs: 8.6 10*3/uL — ABNORMAL HIGH (ref 1.7–7.7)
Neutrophils Relative %: 62 %
Platelets: 293 10*3/uL (ref 150–400)
RBC: 4.81 MIL/uL (ref 4.22–5.81)
RDW: 13.7 % (ref 11.5–15.5)
WBC: 13.7 10*3/uL — ABNORMAL HIGH (ref 4.0–10.5)
nRBC: 0 % (ref 0.0–0.2)

## 2020-04-04 LAB — MAGNESIUM: Magnesium: 1.9 mg/dL (ref 1.7–2.4)

## 2020-04-04 LAB — BASIC METABOLIC PANEL
Anion gap: 13 (ref 5–15)
BUN: 17 mg/dL (ref 8–23)
CO2: 27 mmol/L (ref 22–32)
Calcium: 9.3 mg/dL (ref 8.9–10.3)
Chloride: 97 mmol/L — ABNORMAL LOW (ref 98–111)
Creatinine, Ser: 0.94 mg/dL (ref 0.61–1.24)
GFR, Estimated: >60 mL/min (ref >60)
Glucose, Bld: 104 mg/dL — ABNORMAL HIGH (ref 70–99)
Potassium: 3.4 mmol/L — ABNORMAL LOW (ref 3.5–5.1)
Sodium: 137 mmol/L (ref 135–145)

## 2020-04-04 MED ORDER — POTASSIUM CHLORIDE CRYS ER 20 MEQ PO TBCR
40.0000 meq | EXTENDED_RELEASE_TABLET | Freq: Once | ORAL | Status: AC
  Administered 2020-04-04: 40 meq via ORAL
  Filled 2020-04-04: qty 2

## 2020-04-04 MED ORDER — CIPROFLOXACIN HCL 500 MG PO TABS: 500.0000 mg | ORAL_TABLET | Freq: Two times a day (BID) | ORAL | 0 refills | Status: AC

## 2020-04-04 MED ORDER — TAMSULOSIN HCL 0.4 MG PO CAPS: 0.4000 mg | ORAL_CAPSULE | Freq: Every day | ORAL | 0 refills | Status: AC

## 2020-04-04 NOTE — Discharge Summary (Signed)
Physician Discharge Summary  Randy Shepherd:[id number] DOB: 1950/08/06 DOA: 03/31/2020  PCP: [facility]  Admit date: 03/31/2020 Discharge date: 04/04/2020  Admitted From: Home Disposition: Home  Recommendations for Outpatient Follow-up:  1. Follow up with PCP in 1 week with repeat CBC/BMP 2. Outpatient follow-up with urology 3. Comply with medications and follow-up 4. Follow up in ED if symptoms worsen or new appear   Home Health: No Equipment/Devices: None  Discharge Condition: Stable CODE STATUS: Full Diet recommendation: Heart healthy  Brief/Interim Summary: 69 year old male with history of hypertension, prior unspecified stroke with residual aphasia, urinary retention status post Foley catheter placement noncompliance, homelessness presented with hematuria and partially dislodged catheter.  On presentation UA was consistent with UTI and patient had acute kidney injury as well as leukocytosis.  New catheter was placed in the ED. urology was consulted.  During the hospitalization, his condition has improved.  Urine cultures grew Providencia rettgeri; antibiotics switched to Rocephin.  Urology was consulted.  Patient underwent bedside cystoscopy on 04/03/2020 and subsequently Foley catheter was removed.  Urology has cleared the patient for discharge.  He will be discharged home on oral antibiotics.  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 - UA was consistent with UTI and patient had acute kidney injury as well as leukocytosis.  New catheter was placed in the ED. -Treated with IV fluids.  Renal function has normalized.  WBC is better compared to admission. Urine culture is growing Providencia rettgeri.  Currently on Rocephin.  Will switch to oral ciprofloxacin today on discharge to  complete total of 2 weeks course of antibiotics -Continue Flomax -CT of the abdomen and pelvis with contrast was done yesterday as per urology recommendations which showed thickened bladder wall.Patient underwent bedside cystoscopy on 04/03/2020 and subsequently Foley catheter was removed.  No obvious tumor was found.  Patient is subsequently voiding on his own.  Urology has cleared the patient for discharge with outpatient follow-up with urology.    Hypertension -Blood pressure improving.  Continue home regimen.  History of prior stroke with aphasia -will resume aspirin and Plavix upon discharge -Tolerated PT eval.  Hypokalemia -Replace prior to discharge  Hypothyroidism-continue levothyroxine  Homelessness -Patient's whole family has been apparently living in a car.  Social work has been consulted.  Patient does not want to go to homeless shelter for now.    Discharge Instructions  Discharge Instructions    Diet - low sodium heart healthy   Complete by: As directed    Increase activity slowly   Complete by: As directed      Allergies as of 04/04/2020   No Known Allergies     Medication List    STOP taking these medications   ondansetron 4 MG disintegrating tablet Commonly known as: Zofran ODT   oxyCODONE 5 MG immediate release tablet Commonly known as: Roxicodone     TAKE these medications   amLODipine 10 MG tablet Commonly known as: NORVASC Take 10 mg by mouth daily.   ARIPiprazole 5 MG tablet Commonly known as: ABILIFY Take 2.5 mg by mouth daily.   aspirin EC 81 MG tablet Take 81 mg by mouth daily. Swallow whole.   benazepril 40 MG tablet Commonly known as: LOTENSIN Take 40 mg by mouth daily.   buPROPion 150 MG 12 hr tablet Commonly known as: WELLBUTRIN SR Take 150 mg by mouth 2 (two)  times daily.   Cholecalciferol 50 MCG (2000 UT) Caps Take 2,000 Units by mouth daily.   ciprofloxacin 500 MG tablet Commonly known as: Cipro Take 1 tablet (500  mg total) by mouth 2 (two) times daily for 10 days.   clopidogrel 75 MG tablet Commonly known as: PLAVIX Take 1 tablet (75 mg total) by mouth daily.   gabapentin 300 MG capsule Commonly known as: NEURONTIN Take 300 mg by mouth 3 (three) times daily.   levothyroxine 88 MCG tablet Commonly known as: SYNTHROID Take 176 mcg by mouth daily before breakfast.   sertraline 100 MG tablet Commonly known as: ZOLOFT Take 200 mg by mouth daily.   tamsulosin 0.4 MG Caps capsule Commonly known as: FLOMAX Take 1 capsule (0.4 mg total) by mouth daily after supper.       Follow-up Information    [facility], [facility]. Schedule an appointment as soon as possible for a visit in 1 week(s).   Why: with repeat CBC/BMP Contact information: [place] [address] [phone]        Irine Seal, MD. Schedule an appointment as soon as possible for a visit in 1 week(s).   Specialty: Urology Contact information: [place] [address] [phone]              No Known Allergies  Consultations:  Urology   Procedures/Studies: CT ABDOMEN PELVIS W CONTRAST  Result Date: 04/02/2020 CLINICAL DATA:  Patient unable to urinate.  Blood in urine. EXAM: CT ABDOMEN AND PELVIS WITH CONTRAST TECHNIQUE: Multidetector CT imaging of the abdomen and pelvis was performed using the standard protocol following bolus administration of intravenous contrast. CONTRAST:  131mL OMNIPAQUE IOHEXOL 300 MG/ML  SOLN COMPARISON:  March 31, 2020 FINDINGS: Lower chest: Three-vessel coronary artery disease. Mildly prominent paraesophageal lymph node measuring 9 mm, unchanged. The lower chest is otherwise normal. Hepatobiliary: No focal liver abnormality is seen. No gallstones, gallbladder wall thickening, or biliary dilatation. Pancreas: Unremarkable. No pancreatic ductal dilatation or surrounding inflammatory changes. Spleen: Normal in size without focal abnormality.  Adrenals/Urinary Tract: Adrenal glands are normal. No renal stones or masses are identified. No hydronephrosis or perinephric stranding. The ureters are normal in caliber. On delayed imaging, no filling defects are seen in the upper renal collecting systems or ureters. The bladder is thick walled with mild adjacent stranding. There is a Foley catheter in the bladder. On initial imaging, there appears to be enhancement of the bladder mucosa. High attenuation pulled posteriorly may represent blood products, also seen on the December 1st study. On delayed imaging, no discrete mass is seen in the bladder. Evaluation is limited as the bladder is poorly distended and there is a Foley catheter in place. Stomach/Bowel: The stomach, small bowel, and colon are normal. The appendix is not visualized but there is no evidence of appendicitis. Vascular/Lymphatic: Calcified atherosclerosis is seen in the nonaneurysmal aorta. No adenopathy seen in the abdomen or pelvis. Reproductive: Prostate is unremarkable. Other: No abdominal wall hernia or abnormality. No abdominopelvic ascites. Musculoskeletal: No acute or significant osseous findings. IMPRESSION: 1. The bladder is thick walled with adjacent stranding which could be infectious or inflammatory. There appears to be enhancement of the bladder mucosa on initial imaging which could also be due to hyperemia from infection or inflammation. Evaluation for a mass is limited due to a Foley catheter and relatively poor distention. However, no discrete filling defects are seen. High attenuation posteriorly in the bladder on initial imaging may represent blood  products, also seen on the March 31, 2020 study, given history. 2. Three-vessel coronary artery disease. 3. Mildly prominent paraesophageal node measuring 9 mm, unchanged. This is nonspecific but could be reactive. Electronically Signed   By: Dorise Bullion III M.D   On: 04/02/2020 17:30   CT Renal Stone Study  Result Date:  04/01/2020 CLINICAL DATA:  Acute renal failure EXAM: CT ABDOMEN AND PELVIS WITHOUT CONTRAST TECHNIQUE: Multidetector CT imaging of the abdomen and pelvis was performed following the standard protocol without IV contrast. COMPARISON:  None. FINDINGS: Lower chest: No acute abnormality. Hepatobiliary: No focal liver abnormality is seen. No gallstones, gallbladder wall thickening, or biliary dilatation. Pancreas: Unremarkable. No pancreatic ductal dilatation or surrounding inflammatory changes. Spleen: Normal in size without focal abnormality. Adrenals/Urinary Tract: Adrenal glands are within normal limits. The kidneys are well visualized bilaterally without renal calculi or obstructive changes. Ureters are within normal limits. Bladder is decompressed by Foley catheter. Some air is noted within the bladder. Additionally some hyperdense material is noted which likely represents thrombus given the clinical history. No contrast was administered for this exam. Stomach/Bowel: No obstructive or inflammatory changes of the colon are seen. The appendix has been surgically removed. No small bowel or gastric abnormality is noted. Vascular/Lymphatic: Aortic atherosclerosis. No enlarged abdominal or pelvic lymph nodes. Reproductive: Prostate is unremarkable. Other: No abdominal wall hernia or abnormality. No abdominopelvic ascites. Musculoskeletal: No acute or significant osseous findings. IMPRESSION: Foley catheter is well seated within the bladder. Some hyperdense material is noted which may represent thrombus given the hematuria. No obstructive changes of the collecting systems are seen. No other focal abnormality is noted. Electronically Signed   By: Inez Catalina M.D.   On: 04/01/2020 00:12    Bedside cystoscopy on 04/03/2020   Subjective: Patient seen and examined at bedside.  Poor historian.  Feels better and feels okay to go home today.  He is urinating on his own and denies any blood in his urine.  No overnight  fever, worsening shortness of breath reported.  Discharge Exam: Vitals:   04/03/20 2057 04/04/20 0523  BP: (!) 159/90 (!) 143/86  Pulse: 69 72  Resp: 18 19  Temp: 98.1 F (36.7 C) 97.6 F (36.4 C)  SpO2: 98% 94%    General: Pt is alert, awake, not in acute distress.  Poor historian.  Flat affect. Cardiovascular: rate controlled, S1/S2 + Respiratory: bilateral decreased breath sounds at bases Abdominal: Soft, NT, ND, bowel sounds + Extremities: no edema, no cyanosis    The results of significant diagnostics from this hospitalization (including imaging, microbiology, ancillary and laboratory) are listed below for reference.     Microbiology: Recent Results (from the past 240 hour(s))  Urine culture     Status: Abnormal   Collection Time: 03/31/20 11:00 PM   Specimen: Urine, Clean Catch  Result Value Ref Range Status   Specimen Description   Final    URINE, CLEAN CATCH Performed at [facility], [place] [address] [place], [place] [address]    Special Requests   Final    NONE Performed at [facility], [place] [address], [place], [place] [address]    Culture >=100,000 COLONIES/mL PROVIDENCIA RETTGERI (A)  Final   Report Status 04/03/2020 FINAL  Final   Organism ID, Bacteria PROVIDENCIA RETTGERI (A)  Final      Susceptibility   Providencia rettgeri - MIC*    AMPICILLIN RESISTANT Resistant     CEFAZOLIN >=64 RESISTANT Resistant  CEFEPIME 1 SENSITIVE Sensitive     CEFTRIAXONE <=0.25 SENSITIVE Sensitive     CIPROFLOXACIN <=0.25 SENSITIVE Sensitive     GENTAMICIN <=1 SENSITIVE Sensitive     IMIPENEM 2 SENSITIVE Sensitive     NITROFURANTOIN 128 RESISTANT Resistant     TRIMETH/SULFA <=20 SENSITIVE Sensitive     AMPICILLIN/SULBACTAM >=32 RESISTANT Resistant     PIP/TAZO <=4 SENSITIVE Sensitive     * >=100,000 COLONIES/mL PROVIDENCIA RETTGERI  Resp Panel by RT-PCR (Flu A&B, Covid) Nasopharyngeal Swab     Status: None   Collection Time:  04/01/20 12:15 AM   Specimen: Nasopharyngeal Swab; Nasopharyngeal(NP) swabs in vial transport medium  Result Value Ref Range Status   SARS Coronavirus 2 by RT PCR NEGATIVE NEGATIVE Final    Comment: (NOTE) SARS-CoV-2 target nucleic acids are NOT DETECTED.  The SARS-CoV-2 RNA is generally detectable in upper respiratory specimens during the acute phase of infection. The lowest concentration of SARS-CoV-2 viral copies this assay can detect is 138 copies/mL. A negative result does not preclude SARS-Cov-2 infection and should not be used as the sole basis for treatment or other patient management decisions. A negative result may occur with  improper specimen collection/handling, submission of specimen other than nasopharyngeal swab, presence of viral mutation(s) within the areas targeted by this assay, and inadequate number of viral copies(<138 copies/mL). A negative result must be combined with clinical observations, patient history, and epidemiological information. The expected result is Negative.  Fact Sheet for Patients:  EntrepreneurPulse.com.au  Fact Sheet for Healthcare Providers:  IncredibleEmployment.be  This test is no t yet approved or cleared by the [place] FDA and  has been authorized for detection and/or diagnosis of SARS-CoV-2 by FDA under an Emergency Use Authorization (EUA). This EUA will remain  in effect (meaning this test can be used) for the duration of the COVID-19 declaration under Section 564(b)(1) of the Act, 21 U.S.C.section 360bbb-3(b)(1), unless the authorization is terminated  or revoked sooner.       Influenza A by PCR NEGATIVE NEGATIVE Final   Influenza B by PCR NEGATIVE NEGATIVE Final    Comment: (NOTE) The Xpert Xpress SARS-CoV-2/FLU/RSV plus assay is intended as an aid in the diagnosis of influenza from Nasopharyngeal swab specimens and should not be used as a sole basis for treatment. Nasal washings  and aspirates are unacceptable for Xpert Xpress SARS-CoV-2/FLU/RSV testing.  Fact Sheet for Patients: EntrepreneurPulse.com.au  Fact Sheet for Healthcare Providers: IncredibleEmployment.be  This test is not yet approved or cleared by the [place] FDA and has been authorized for detection and/or diagnosis of SARS-CoV-2 by FDA under an Emergency Use Authorization (EUA). This EUA will remain in effect (meaning this test can be used) for the duration of the COVID-19 declaration under Section 564(b)(1) of the Act, 21 U.S.C. section 360bbb-3(b)(1), unless the authorization is terminated or revoked.  Performed at [facility], [place] [address] [place], [place] [address]      Labs: BNP (last 3 results) No results for input(s): BNP in the last 8760 hours. Basic Metabolic Panel: Recent Labs  Lab 03/31/20 2143 04/01/20 0512 04/02/20 0505 04/03/20 0856 04/04/20 0535  NA 138 138 137 134* 137  K 3.5 2.9* 3.3* 3.2* 3.4*  CL 101 105 104 97* 97*  CO2 17* 18* 21* 22 27  GLUCOSE 94 107* 101* 179* 104*  BUN 49* 43* 17 16 17   CREATININE 2.22* 1.50* 0.86 0.90 0.94  CALCIUM 9.6 9.3 8.8* 8.9 9.3  MG  --  2.3 2.0 1.9 1.9   Liver Function Tests: No results for input(s): AST, ALT, ALKPHOS, BILITOT, PROT, ALBUMIN in the last 168 hours. No results for input(s): LIPASE, AMYLASE in the last 168 hours. No results for input(s): AMMONIA in the last 168 hours. CBC: Recent Labs  Lab 03/31/20 2143 04/01/20 0512 04/02/20 0505 04/03/20 0856 04/04/20 0535  WBC 21.8* 16.9* 13.9* 12.9* 13.7*  NEUTROABS 16.8*  --  9.1* 8.6* 8.6*  HGB 14.8 13.6 13.2 13.7 14.0  HCT 44.0 41.3 39.1 40.7 42.2  MCV 88.2 89.4 88.1 87.9 87.7  PLT 345 272 240 251 293   Cardiac Enzymes: No results for input(s): CKTOTAL, CKMB, CKMBINDEX, TROPONINI in the last 168 hours. BNP: Invalid input(s): POCBNP CBG: No results for input(s): GLUCAP in the last 168  hours. D-Dimer No results for input(s): DDIMER in the last 72 hours. Hgb A1c No results for input(s): HGBA1C in the last 72 hours. Lipid Profile No results for input(s): CHOL, HDL, LDLCALC, TRIG, CHOLHDL, LDLDIRECT in the last 72 hours. Thyroid function studies No results for input(s): TSH, T4TOTAL, T3FREE, THYROIDAB in the last 72 hours.  Invalid input(s): FREET3 Anemia work up No results for input(s): VITAMINB12, FOLATE, FERRITIN, TIBC, IRON, RETICCTPCT in the last 72 hours. Urinalysis    Component Value Date/Time   COLORURINE AMBER (A) 03/31/2020 2300   APPEARANCEUR CLOUDY (A) 03/31/2020 2300   LABSPEC 1.012 03/31/2020 2300   PHURINE 8.0 03/31/2020 2300   GLUCOSEU NEGATIVE 03/31/2020 2300   HGBUR MODERATE (A) 03/31/2020 2300   BILIRUBINUR NEGATIVE 03/31/2020 2300   KETONESUR 80 (A) 03/31/2020 2300   PROTEINUR 100 (A) 03/31/2020 2300   NITRITE NEGATIVE 03/31/2020 2300   LEUKOCYTESUR LARGE (A) 03/31/2020 2300   Sepsis Labs Invalid input(s): PROCALCITONIN,  WBC,  LACTICIDVEN Microbiology Recent Results (from the past 240 hour(s))  Urine culture     Status: Abnormal   Collection Time: 03/31/20 11:00 PM   Specimen: Urine, Clean Catch  Result Value Ref Range Status   Specimen Description   Final    URINE, CLEAN CATCH Performed at [facility], [place] [address], [place], [place] [address]    Special Requests   Final    NONE Performed at [facility], [place] [address] [place],  [address]    Culture >=100,000 COLONIES/mL PROVIDENCIA RETTGERI (A)  Final   Report Status 04/03/2020 FINAL  Final   Organism ID, Bacteria PROVIDENCIA RETTGERI (A)  Final      Susceptibility   Providencia rettgeri - MIC*    AMPICILLIN RESISTANT Resistant     CEFAZOLIN >=64 RESISTANT Resistant     CEFEPIME 1 SENSITIVE Sensitive     CEFTRIAXONE <=0.25 SENSITIVE Sensitive     CIPROFLOXACIN <=0.25 SENSITIVE Sensitive     GENTAMICIN <=1 SENSITIVE Sensitive      IMIPENEM 2 SENSITIVE Sensitive     NITROFURANTOIN 128 RESISTANT Resistant     TRIMETH/SULFA <=20 SENSITIVE Sensitive     AMPICILLIN/SULBACTAM >=32 RESISTANT Resistant     PIP/TAZO <=4 SENSITIVE Sensitive     * >=100,000 COLONIES/mL PROVIDENCIA RETTGERI  Resp Panel by RT-PCR (Flu A&B, Covid) Nasopharyngeal Swab     Status: None   Collection Time: 04/01/20 12:15 AM   Specimen: Nasopharyngeal Swab; Nasopharyngeal(NP) swabs in vial transport medium  Result Value Ref Range Status   SARS Coronavirus 2 by RT PCR NEGATIVE NEGATIVE Final    Comment: (NOTE) SARS-CoV-2 target nucleic acids are NOT DETECTED.  The SARS-CoV-2 RNA is generally detectable in  upper respiratory specimens during the acute phase of infection. The lowest concentration of SARS-CoV-2 viral copies this assay can detect is 138 copies/mL. A negative result does not preclude SARS-Cov-2 infection and should not be used as the sole basis for treatment or other patient management decisions. A negative result may occur with  improper specimen collection/handling, submission of specimen other than nasopharyngeal swab, presence of viral mutation(s) within the areas targeted by this assay, and inadequate number of viral copies(<138 copies/mL). A negative result must be combined with clinical observations, patient history, and epidemiological information. The expected result is Negative.  Fact Sheet for Patients:  EntrepreneurPulse.com.au  Fact Sheet for Healthcare Providers:  IncredibleEmployment.be  This test is no t yet approved or cleared by the [place] FDA and  has been authorized for detection and/or diagnosis of SARS-CoV-2 by FDA under an Emergency Use Authorization (EUA). This EUA will remain  in effect (meaning this test can be used) for the duration of the COVID-19 declaration under Section 564(b)(1) of the Act, 21 U.S.C.section 360bbb-3(b)(1), unless the authorization is  terminated  or revoked sooner.       Influenza A by PCR NEGATIVE NEGATIVE Final   Influenza B by PCR NEGATIVE NEGATIVE Final    Comment: (NOTE) The Xpert Xpress SARS-CoV-2/FLU/RSV plus assay is intended as an aid in the diagnosis of influenza from Nasopharyngeal swab specimens and should not be used as a sole basis for treatment. Nasal washings and aspirates are unacceptable for Xpert Xpress SARS-CoV-2/FLU/RSV testing.  Fact Sheet for Patients: EntrepreneurPulse.com.au  Fact Sheet for Healthcare Providers: IncredibleEmployment.be  This test is not yet approved or cleared by the [place] FDA and has been authorized for detection and/or diagnosis of SARS-CoV-2 by FDA under an Emergency Use Authorization (EUA). This EUA will remain in effect (meaning this test can be used) for the duration of the COVID-19 declaration under Section 564(b)(1) of the Act, 21 U.S.C. section 360bbb-3(b)(1), unless the authorization is terminated or revoked.  Performed at [facility], [place] [address], [place], [place] [address]      Time coordinating discharge: 35 minutes  SIGNED:   Aline August, MD  [facility] 04/04/2020, 10:08 AM

## 2020-04-04 NOTE — Progress Notes (Signed)
Patient and wife were discharged home. Instructions were given to patient and spouse and all questions were answered. Patient was taken to main entrance in wheelchair by the NT.

## 2020-04-06 NOTE — Research (Signed)
STROKE AF  Went to see patient over at [facility] ER in waiting room. Did an express carelink transmission. Patient states he is doing ok just having some foley issues. Him and wife are living out of car. Thanked patient for participating in the study.   Current Outpatient Medications:  .  amLODipine (NORVASC) 10 MG tablet, Take 10 mg by mouth daily. , Disp: , Rfl:  .  ARIPiprazole (ABILIFY) 5 MG tablet, Take 2.5 mg by mouth daily., Disp: , Rfl:  .  aspirin EC 81 MG tablet, Take 81 mg by mouth daily. Swallow whole., Disp: , Rfl:  .  benazepril (LOTENSIN) 40 MG tablet, Take 40 mg by mouth daily., Disp: , Rfl:  .  buPROPion (WELLBUTRIN SR) 150 MG 12 hr tablet, Take 150 mg by mouth 2 (two) times daily., Disp: , Rfl:  .  Cholecalciferol 50 MCG (2000 UT) CAPS, Take 2,000 Units by mouth daily., Disp: , Rfl:  .  ciprofloxacin (CIPRO) 500 MG tablet, Take 1 tablet (500 mg total) by mouth 2 (two) times daily for 10 days., Disp: 20 tablet, Rfl: 0 .  clopidogrel (PLAVIX) 75 MG tablet, Take 1 tablet (75 mg total) by mouth daily., Disp: 30 tablet, Rfl: 1 .  gabapentin (NEURONTIN) 300 MG capsule, Take 300 mg by mouth 3 (three) times daily. , Disp: , Rfl:  .  levothyroxine (SYNTHROID) 88 MCG tablet, Take 176 mcg by mouth daily before breakfast. , Disp: , Rfl:  .  sertraline (ZOLOFT) 100 MG tablet, Take 200 mg by mouth daily. , Disp: , Rfl:  .  tamsulosin (FLOMAX) 0.4 MG CAPS capsule, Take 1 capsule (0.4 mg total) by mouth daily after supper., Disp: 30 capsule, Rfl: 0

## 2020-05-21 IMAGING — DX DG ELBOW COMPLETE 3+V*L*
4 series · 4 of 4 positions shown · non-contrast
Comparison: Left forearm radiographs 02/10/2019

CLINICAL DATA: Radial head fracture.  Fall yesterday.

EXAM:
LEFT ELBOW - COMPLETE 3+ VIEW

[x elbow ap left]
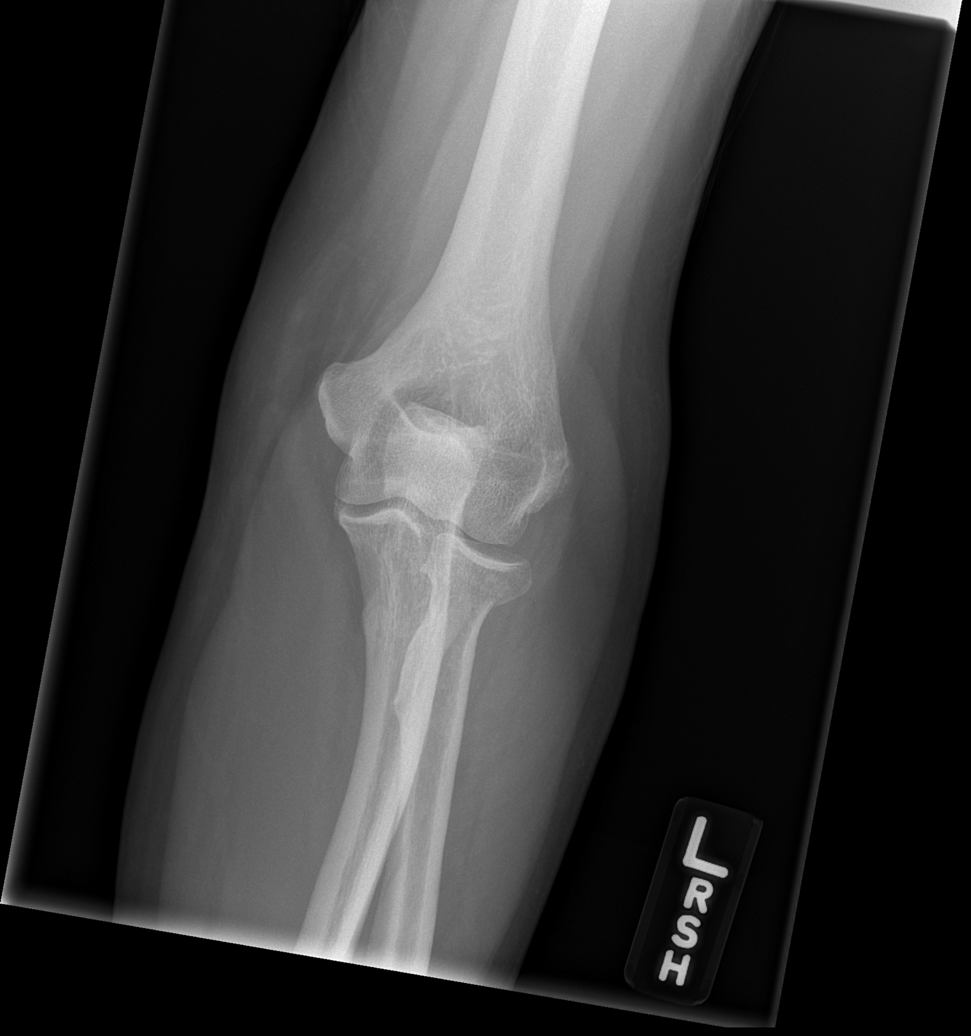

[x elbow obl left (1 of 2)]
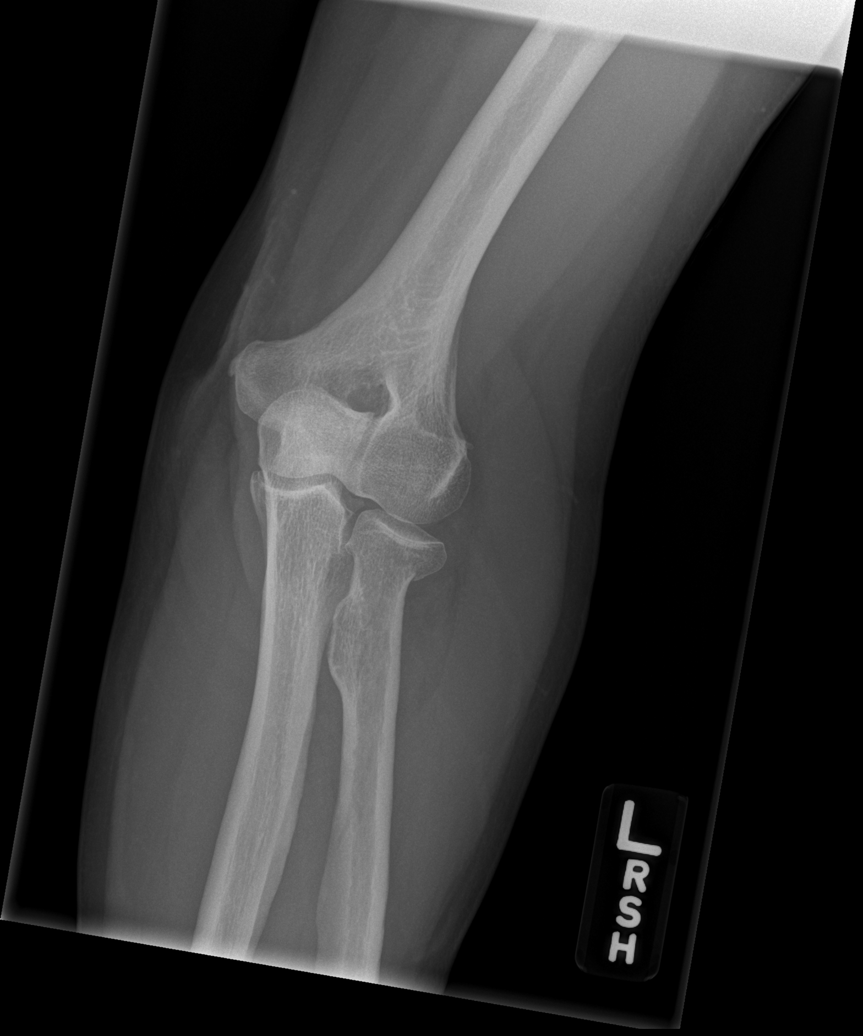

[x elbow obl left (2 of 2)]
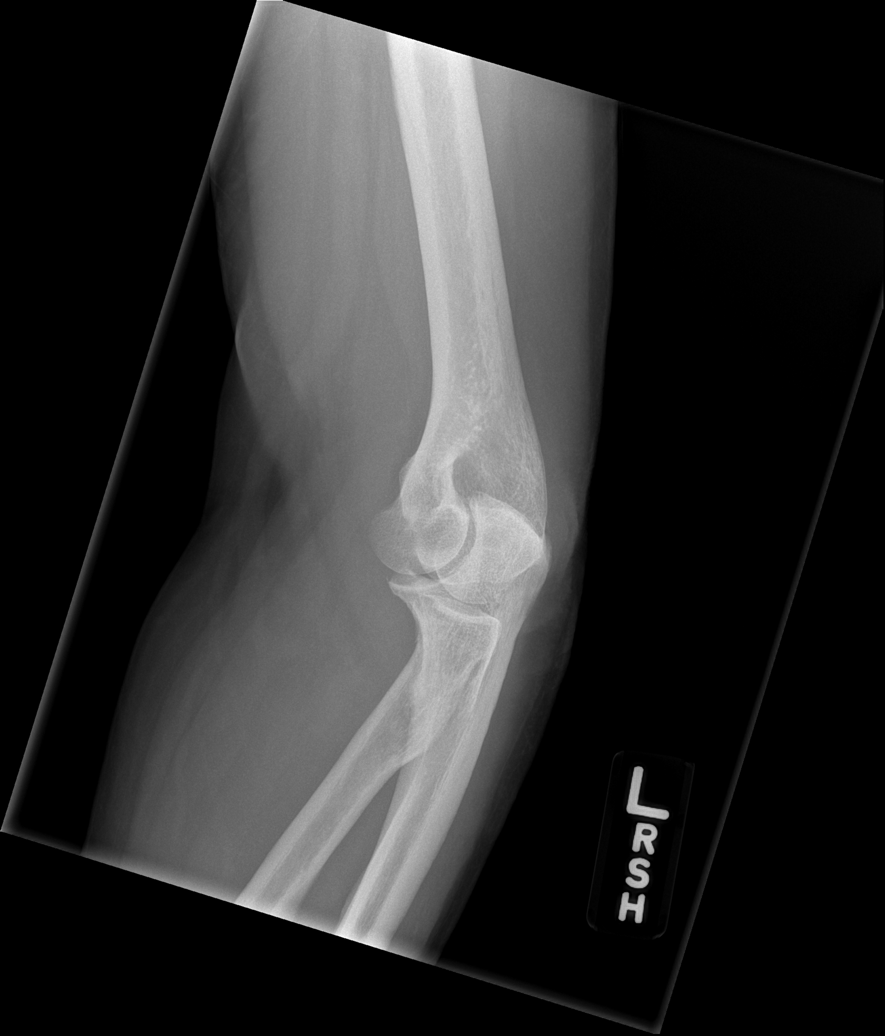

[x elbow lat left]
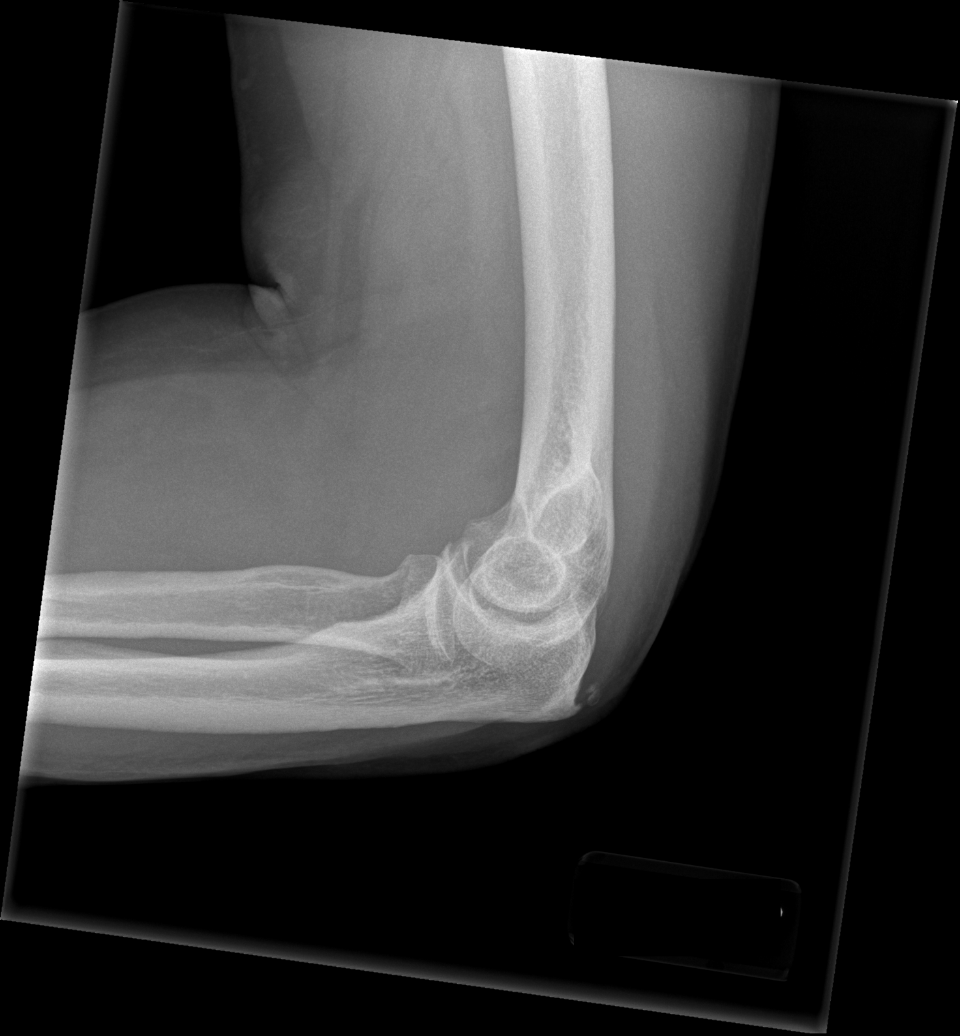

[4 of 4 positions shown; findings below may reference images not displayed]

FINDINGS: A radial neck fracture is present. Radial head is intact. Elbow is
located. A small effusion is present.
IMPRESSION: Mildly displaced radial neck fracture is confirmed.

## 2021-01-05 ENCOUNTER — Encounter: Payer: Self-pay | Admitting: Internal Medicine

## 2021-05-14 IMAGING — CR DG ANKLE COMPLETE 3+V*R*
3 series · 3 of 3 positions shown · non-contrast
Comparison: None.

CLINICAL DATA: Fall and injury

EXAM:
RIGHT ANKLE - COMPLETE 3+ VIEW

[ankle ap]
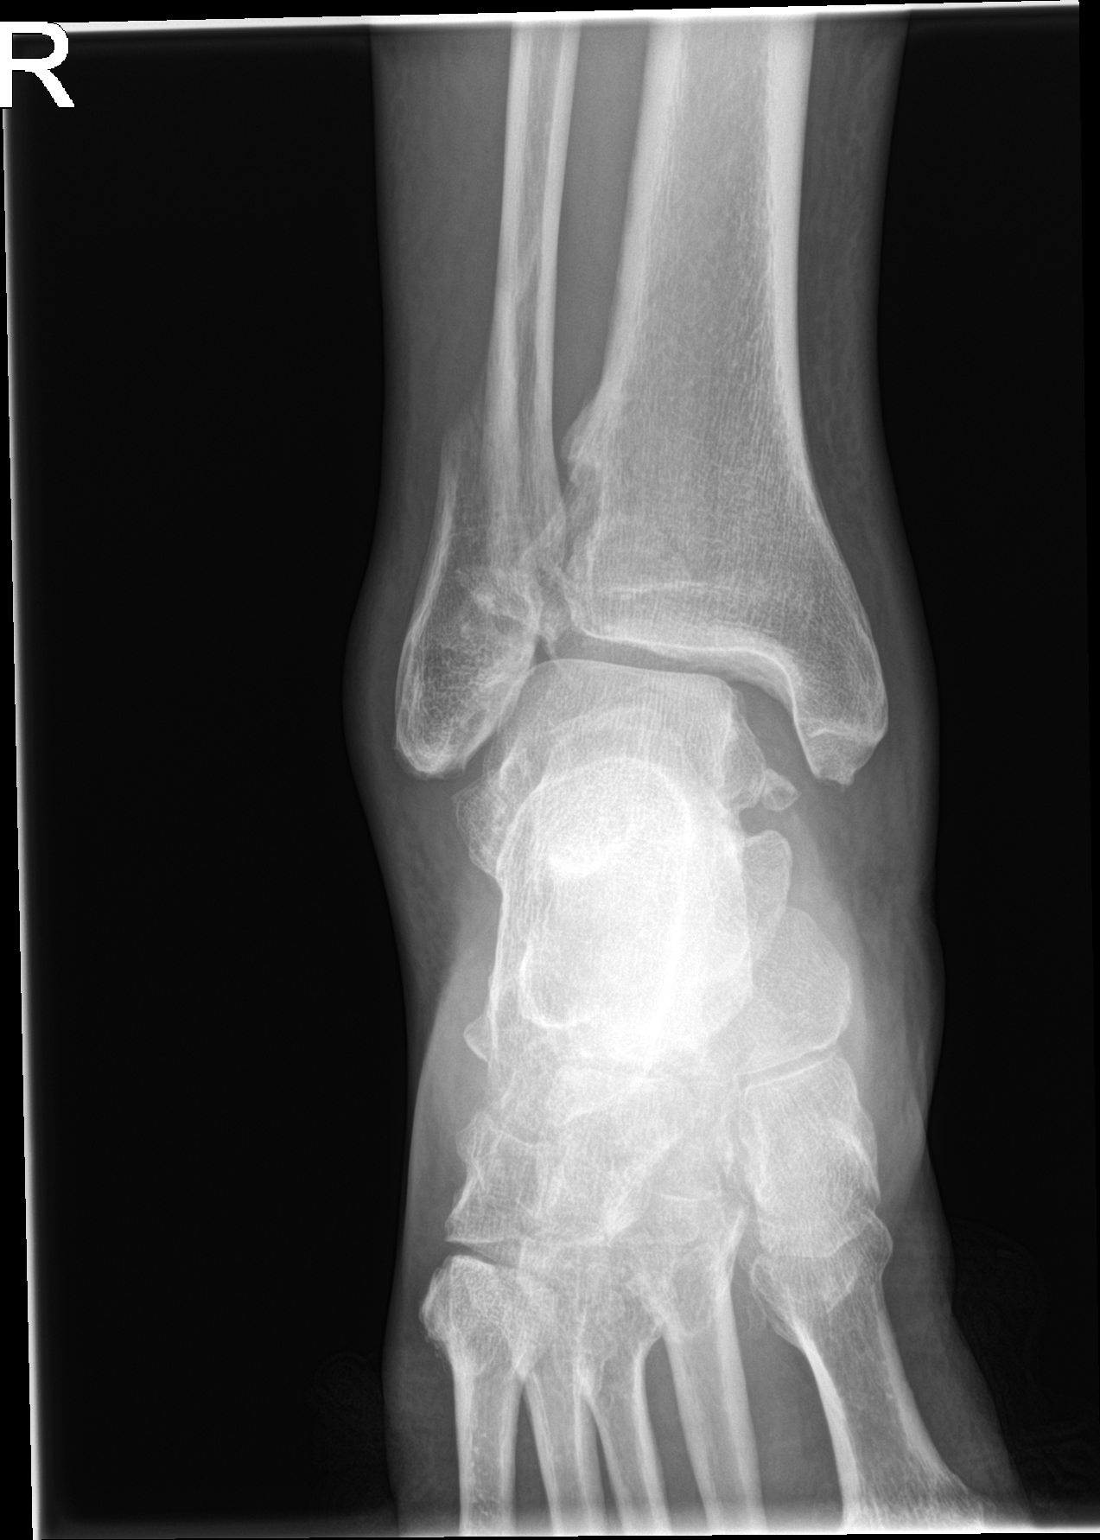

[ankle obl]
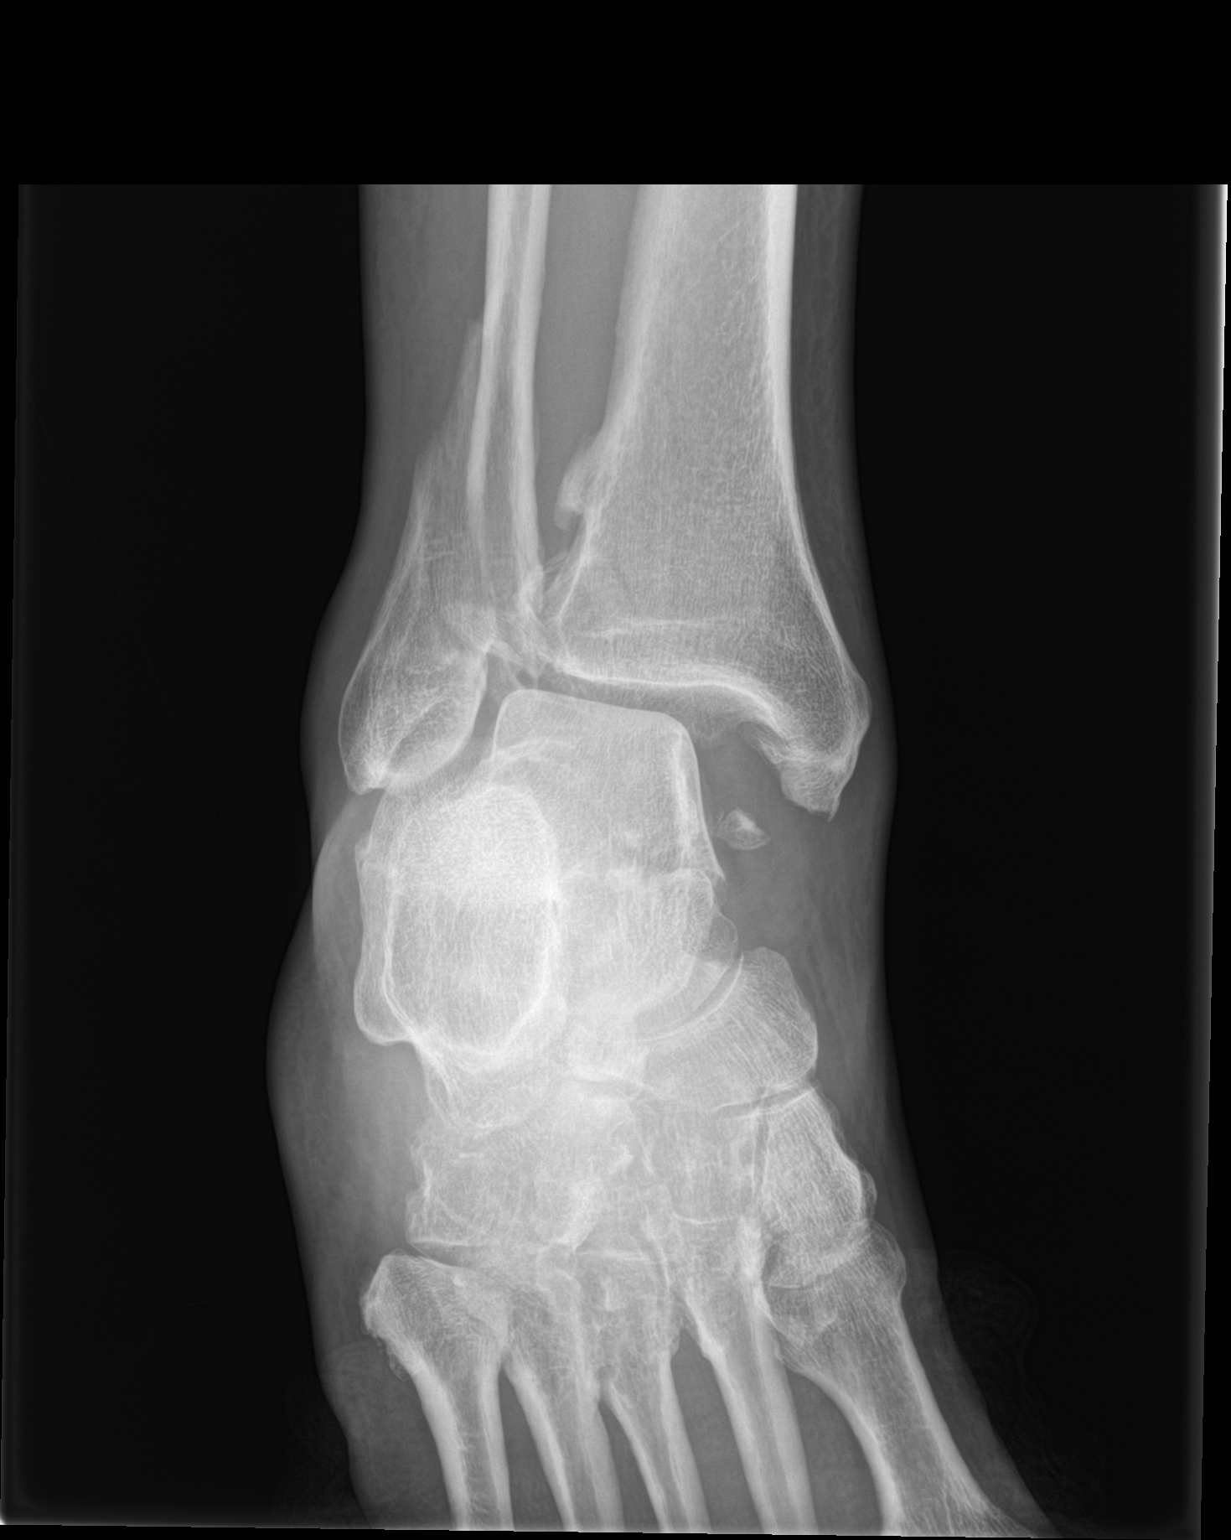

[ankle lat]
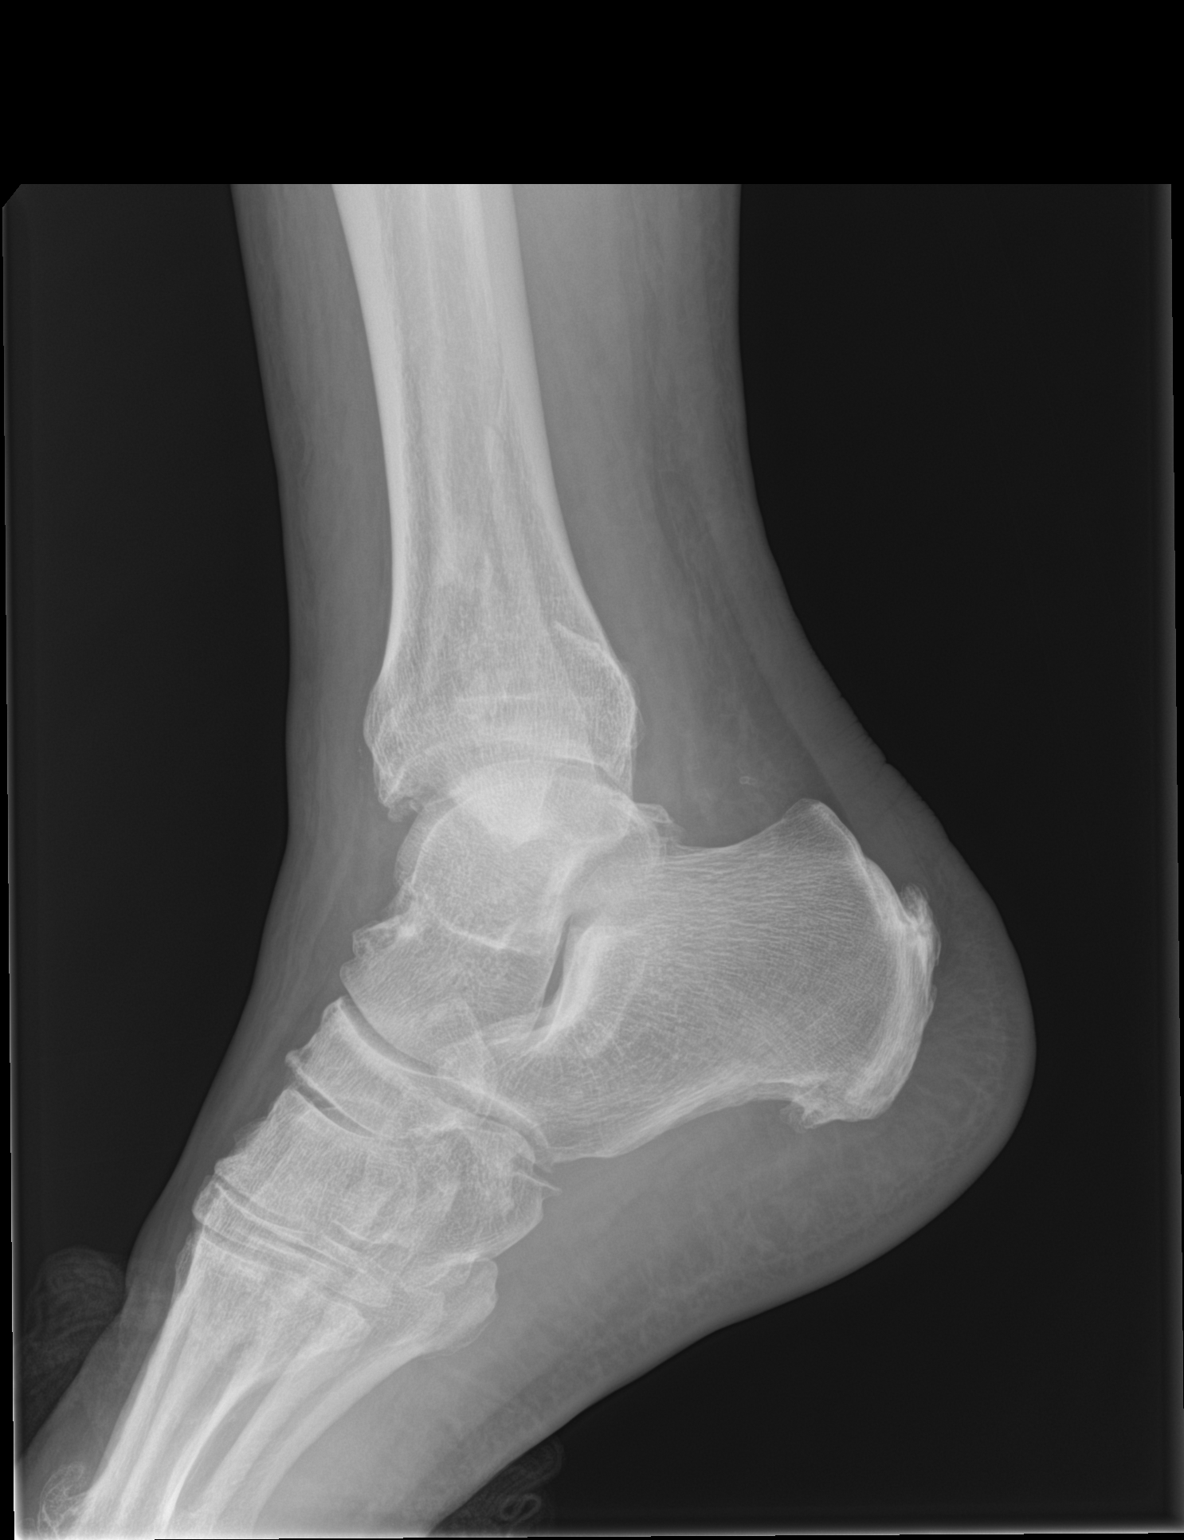

[3 of 3 positions shown; findings below may reference images not displayed]

FINDINGS: There is a comminuted mildly displaced posterior malleolar fracture.
Obliquely oriented distal fibular fracture is also noted. There is
widening of the medial clear space measuring up to 7 mm in
transverse dimension. Overlying soft tissue swelling seen diffusely
around the ankle. A small ankle joint effusion is seen. Midfoot
osteoarthritis is noted with mild joint space loss. Calcaneal
enthesophytes are seen.
IMPRESSION: Mildly displaced distal fibular and posterior malleolar fractures
with widening of the medial clear space.
